# Patient Record
Sex: Female | Born: 1990
Health system: Southern US, Community
[De-identification: ages and names within clinical notes are randomized; demographics above are authoritative.]

## PROBLEM LIST (undated history)

## (undated) ENCOUNTER — Inpatient Hospital Stay (HOSPITAL_COMMUNITY): Payer: Self-pay

## (undated) DIAGNOSIS — G4730 Sleep apnea, unspecified: Secondary | ICD-10-CM

## (undated) DIAGNOSIS — M109 Gout, unspecified: Secondary | ICD-10-CM

## (undated) DIAGNOSIS — I10 Essential (primary) hypertension: Secondary | ICD-10-CM

## (undated) DIAGNOSIS — K219 Gastro-esophageal reflux disease without esophagitis: Secondary | ICD-10-CM

## (undated) DIAGNOSIS — O219 Vomiting of pregnancy, unspecified: Secondary | ICD-10-CM

## (undated) DIAGNOSIS — E119 Type 2 diabetes mellitus without complications: Secondary | ICD-10-CM

## (undated) DIAGNOSIS — J45909 Unspecified asthma, uncomplicated: Secondary | ICD-10-CM

## (undated) HISTORY — DX: Gout, unspecified: M10.9

## (undated) HISTORY — DX: Gastro-esophageal reflux disease without esophagitis: K21.9

## (undated) HISTORY — PX: NO PAST SURGERIES: SHX2092

## (undated) HISTORY — DX: Unspecified asthma, uncomplicated: J45.909

## (undated) HISTORY — DX: Sleep apnea, unspecified: G47.30

---
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
## 2010-06-09 ENCOUNTER — Inpatient Hospital Stay (HOSPITAL_COMMUNITY)
Admission: AD | Admit: 2010-06-09 | Discharge: 2010-06-09 | Payer: Self-pay | Source: Home / Self Care | Attending: Family Medicine | Admitting: Family Medicine

## 2010-06-10 LAB — URINALYSIS, ROUTINE W REFLEX MICROSCOPIC
Bilirubin Urine: NEGATIVE
Hgb urine dipstick: NEGATIVE
Ketones, ur: NEGATIVE mg/dL
Nitrite: NEGATIVE
Protein, ur: 30 mg/dL — AB
Specific Gravity, Urine: 1.01 (ref 1.005–1.030)
Urine Glucose, Fasting: NEGATIVE mg/dL
Urobilinogen, UA: 1 mg/dL (ref 0.0–1.0)
pH: 6 (ref 5.0–8.0)

## 2010-06-10 LAB — URINE MICROSCOPIC-ADD ON

## 2010-06-10 LAB — AMNISURE RUPTURE OF MEMBRANE (ROM) NOT AT ARMC: Amnisure ROM: NEGATIVE

## 2010-06-10 LAB — GLUCOSE, CAPILLARY: Glucose-Capillary: 77 mg/dL (ref 70–99)

## 2015-10-23 ENCOUNTER — Encounter (HOSPITAL_COMMUNITY): Payer: Self-pay

## 2015-10-23 ENCOUNTER — Emergency Department (HOSPITAL_COMMUNITY): Payer: Medicaid Other

## 2015-10-23 ENCOUNTER — Emergency Department (HOSPITAL_COMMUNITY)
Admission: EM | Admit: 2015-10-23 | Discharge: 2015-10-23 | Disposition: A | Discharge status: Home / Self Care | Payer: Medicaid Other | Attending: Emergency Medicine | Admitting: Emergency Medicine

## 2015-10-23 DIAGNOSIS — E860 Dehydration: Secondary | ICD-10-CM

## 2015-10-23 DIAGNOSIS — E119 Type 2 diabetes mellitus without complications: Secondary | ICD-10-CM | POA: Diagnosis not present

## 2015-10-23 DIAGNOSIS — R109 Unspecified abdominal pain: Secondary | ICD-10-CM | POA: Insufficient documentation

## 2015-10-23 DIAGNOSIS — R42 Dizziness and giddiness: Secondary | ICD-10-CM | POA: Insufficient documentation

## 2015-10-23 DIAGNOSIS — K529 Noninfective gastroenteritis and colitis, unspecified: Secondary | ICD-10-CM

## 2015-10-23 LAB — URINE MICROSCOPIC-ADD ON

## 2015-10-23 LAB — CBC
HEMATOCRIT: 39.1 % (ref 36.0–46.0)
HEMOGLOBIN: 13.1 g/dL (ref 12.0–15.0)
MCH: 27 pg (ref 26.0–34.0)
MCHC: 33.5 g/dL (ref 30.0–36.0)
MCV: 80.5 fL (ref 78.0–100.0)
Platelets: 375 10*3/uL (ref 150–400)
RBC: 4.86 MIL/uL (ref 3.87–5.11)
RDW: 13.9 % (ref 11.5–15.5)
WBC: 6.3 10*3/uL (ref 4.0–10.5)

## 2015-10-23 LAB — URINALYSIS, ROUTINE W REFLEX MICROSCOPIC
Glucose, UA: NEGATIVE mg/dL
Hgb urine dipstick: NEGATIVE
Ketones, ur: NEGATIVE mg/dL
NITRITE: NEGATIVE
PH: 6 (ref 5.0–8.0)
Protein, ur: 30 mg/dL — AB
SPECIFIC GRAVITY, URINE: 1.021 (ref 1.005–1.030)

## 2015-10-23 LAB — COMPREHENSIVE METABOLIC PANEL
ALBUMIN: 3.8 g/dL (ref 3.5–5.0)
ALT: 16 U/L (ref 14–54)
ANION GAP: 8 (ref 5–15)
AST: 25 U/L (ref 15–41)
Alkaline Phosphatase: 77 U/L (ref 38–126)
BUN: 16 mg/dL (ref 6–20)
CO2: 25 mmol/L (ref 22–32)
Calcium: 8.6 mg/dL — ABNORMAL LOW (ref 8.9–10.3)
Chloride: 103 mmol/L (ref 101–111)
Creatinine, Ser: 1.14 mg/dL — ABNORMAL HIGH (ref 0.44–1.00)
GFR calc Af Amer: >60 mL/min (ref >60)
GFR calc non Af Amer: >60 mL/min (ref >60)
GLUCOSE: 299 mg/dL — AB (ref 65–99)
POTASSIUM: 4.1 mmol/L (ref 3.5–5.1)
SODIUM: 136 mmol/L (ref 135–145)
TOTAL PROTEIN: 7.3 g/dL (ref 6.5–8.1)
Total Bilirubin: 0.6 mg/dL (ref 0.3–1.2)

## 2015-10-23 LAB — LIPASE, BLOOD: Lipase: 29 U/L (ref 11–51)

## 2015-10-23 LAB — I-STAT BETA HCG BLOOD, ED (MC, WL, AP ONLY): I-stat hCG, quantitative: <5 m[IU]/mL (ref <5)

## 2015-10-23 LAB — CBG MONITORING, ED: Glucose-Capillary: 324 mg/dL — ABNORMAL HIGH (ref 65–99)

## 2015-10-23 MED ORDER — DIATRIZOATE MEGLUMINE & SODIUM 66-10 % PO SOLN: 15.0000 mL | ORAL | Status: DC | PRN

## 2015-10-23 MED ORDER — SODIUM CHLORIDE 0.9 % IV BOLUS (SEPSIS)
2000.0000 mL | Freq: Once | INTRAVENOUS | Status: AC
  Administered 2015-10-23: 2000 mL via INTRAVENOUS

## 2015-10-23 MED ORDER — IOPAMIDOL (ISOVUE-300) INJECTION 61%: 100.0000 mL | Freq: Once | INTRAVENOUS | Status: DC | PRN

## 2015-10-23 MED ORDER — PROMETHAZINE HCL 25 MG PO TABS: 25.0000 mg | ORAL_TABLET | Freq: Three times a day (TID) | ORAL | Status: DC | PRN

## 2015-10-23 NOTE — ED Notes (Signed)
PA stated he will proceed with oral contrast CT.

## 2015-10-23 NOTE — Progress Notes (Signed)
Patient listed as having Medicaid insurance without a pcp.  Pcp listed on patient's insurance card [facility].  EDCM spoke to patient at bedside.  Patient reports [facility].  EDCm provided patient with a list of pcps who accept [place] in [place].  [facility] informed patient that to change pcp on her insurance card she will need to call the DSS to do so.  EDCm provided patient with phone number to DSS and for Medicaid transportation.  [facility] encouraged [facility] to find [facility] as soon as possible.  Patient thankful for services.  No further EDCM needs at this time.

## 2015-10-23 NOTE — ED Notes (Signed)
Pt was in [place] when she passed out at church. Ambulance came and took her to hospital.  Told pt her bp was high.  Pt did not stay b/c she did not have a ride. Pt states she has had n/v/d with abdominal pain since Sunday.  Feels light headed.  No fever.  No change in urination or vaginal discharge.

## 2015-10-23 NOTE — ED Notes (Signed)
Patient transported to CT 

## 2015-10-23 NOTE — ED Notes (Signed)
Nurse is going to start an IV and collect the blood

## 2015-10-23 NOTE — ED Notes (Signed)
RN at bedside attempting IV access for CT.

## 2015-10-23 NOTE — ED Notes (Signed)
Pt ambulatory and independent at discharge.  

## 2015-10-23 NOTE — Discharge Instructions (Signed)
Return here as needed.  Increase your fluid intake, rest as much as possible °

## 2015-10-23 NOTE — ED Notes (Signed)
Attempt US IV x 2 not successful. IV consult order.

## 2015-10-23 NOTE — ED Notes (Signed)
Pt has refused any more IV attempts.  Explained to patient that a CT scan will not be able to be performed if refusal stands.  Pt verbalized understanding and continues to refuse IV.  Will make MD aware.

## 2015-10-23 NOTE — ED Provider Notes (Signed)
CSN: [id number]     Arrival date & time 10/23/15  1513 History   First MD Initiated Contact with Patient 10/23/15 1552     Chief Complaint  Patient presents with  . Abdominal Pain  . Dizziness     (Consider location/radiation/quality/duration/timing/severity/associated sxs/prior Treatment) HPI Patient presents to the emergency department with abdominal discomfort that is intermittent over the last week along with dizziness and a syncopal episode on Sunday.  Patient states that nothing seems to make the edition better or worse.  Patient states that the pain is not constant in her abdomen, but is intermittent.  She states that she has had couple of episodes of vomiting with diarrhea.  Patient is a diabetic but does not take any medications at this time. The patient denies chest pain, shortness of breath, headache,blurred vision, neck pain, fever, cough, weakness, numbness, dizziness, anorexia, edema, rash, back pain, dysuria, hematemesis, bloody stool, near syncope, or syncope. Past Medical History  Diagnosis Date  . Diabetes mellitus without complication (HCC)    History reviewed. No pertinent past surgical history. History reviewed. No pertinent family history. Social History  Substance Use Topics  . Smoking status: Never Smoker   . Smokeless tobacco: None  . Alcohol Use: No   OB History    No data available     Review of Systems  All other systems negative except as documented in the HPI. All pertinent positives and negatives as reviewed in the HPI.  Allergies  Review of patient's allergies indicates no known allergies.  Home Medications   Prior to Admission medications   Not on File   BP 124/87 mmHg  Pulse 79  Temp(Src) 98.3 F (36.8 C) (Oral)  Resp 20  Ht 5\' 6"  (1.676 m)  SpO2 100%  LMP 10/16/2015 Physical Exam  Constitutional: She is oriented to person, place, and time. She appears well-developed and well-nourished. No distress.  HENT:  Head: Normocephalic and  atraumatic.  Mouth/Throat: Oropharynx is clear and moist.  Eyes: Pupils are equal, round, and reactive to light.  Neck: Normal range of motion. Neck supple.  Cardiovascular: Normal rate, regular rhythm and normal heart sounds.  Exam reveals no gallop and no friction rub.   No murmur heard. Pulmonary/Chest: Effort normal and breath sounds normal. No respiratory distress. She has no wheezes.  Abdominal: Soft. Bowel sounds are normal. She exhibits no distension. There is tenderness. There is no rebound and no guarding.  Neurological: She is alert and oriented to person, place, and time. She exhibits normal muscle tone. Coordination normal.  Skin: Skin is warm and dry. No rash noted. No erythema.  Psychiatric: She has a normal mood and affect. Her behavior is normal.  Nursing note and vitals reviewed.   ED Course  Procedures (including critical care time) Labs Review Labs Reviewed  COMPREHENSIVE METABOLIC PANEL - Abnormal; Notable for the following:    Glucose, Bld 299 (*)    Creatinine, Ser 1.14 (*)    Calcium 8.6 (*)    All other components within normal limits  URINALYSIS, ROUTINE W REFLEX MICROSCOPIC (NOT AT [facility]) - Abnormal; Notable for the following:    APPearance CLOUDY (*)    Bilirubin Urine SMALL (*)    Protein, ur 30 (*)    Leukocytes, UA MODERATE (*)    All other components within normal limits  URINE MICROSCOPIC-ADD ON - Abnormal; Notable for the following:    Squamous Epithelial / LPF 0-5 (*)    Bacteria, UA FEW (*)  All other components within normal limits  CBG MONITORING, ED - Abnormal; Notable for the following:    Glucose-Capillary 324 (*)    All other components within normal limits  LIPASE, BLOOD  CBC  I-STAT BETA HCG BLOOD, ED ([facility], [facility], [facility] ONLY)    Imaging Review Ct Abdomen Pelvis Wo Contrast  10/23/2015  CLINICAL DATA:  Acute onset of generalized abdominal pain, nausea, vomiting and diarrhea. Lightheadedness. High blood pressure. Initial encounter. EXAM:  CT ABDOMEN AND PELVIS WITHOUT CONTRAST TECHNIQUE: Multidetector CT imaging of the abdomen and pelvis was performed following the standard protocol without IV contrast. COMPARISON:  None. FINDINGS: The visualized lung bases are clear. The liver and spleen are unremarkable in appearance. The gallbladder is within normal limits. The pancreas and adrenal glands are unremarkable. The kidneys are unremarkable in appearance. There is no evidence of hydronephrosis. No renal or ureteral stones are seen. No perinephric stranding is appreciated. No free fluid is identified. The small bowel is unremarkable in appearance. The stomach is within normal limits. No acute vascular abnormalities are seen. The appendix is normal in caliber and contains air, without evidence of appendicitis. Contrast progresses to the level of the descending colon. The colon is grossly unremarkable in appearance. The bladder is mildly distended and grossly unremarkable. The uterus is unremarkable in appearance. The ovaries are relatively symmetric. No suspicious adnexal masses are seen. No inguinal lymphadenopathy is seen. No acute osseous abnormalities are identified. IMPRESSION: No acute abnormality seen within the abdomen or pelvis. Electronically Signed   By: Roanna Raider M.D.   On: 10/23/2015 20:14   I have personally reviewed and evaluated these images and lab results as part of my medical decision-making.   EKG Interpretation None      MDM   Final diagnoses:  Abdominal pain      The patient states that she is feeling better at this time.  She was given IV fluids.  He believes she is dehydrated.  She will be discharged home and advised she will need to follow up with a primary care doctor.  Patient agrees the plan.  All questions were answered.  The patient's abdominal pain is intermittent in nature, which does not lend itself to being an acute significant issue    Charlestine Night, PA-C 10/23/15 2038  Arby Barrette,  MD 11/01/15 [phone]

## 2016-05-03 ENCOUNTER — Encounter (HOSPITAL_COMMUNITY): Payer: Self-pay

## 2016-05-03 ENCOUNTER — Emergency Department (HOSPITAL_COMMUNITY)
Admission: EM | Admit: 2016-05-03 | Discharge: 2016-05-03 | Disposition: A | Discharge status: Home / Self Care | Payer: Medicaid Other | Attending: Emergency Medicine | Admitting: Emergency Medicine

## 2016-05-03 ENCOUNTER — Emergency Department (HOSPITAL_COMMUNITY): Payer: Medicaid Other

## 2016-05-03 DIAGNOSIS — R002 Palpitations: Secondary | ICD-10-CM | POA: Insufficient documentation

## 2016-05-03 DIAGNOSIS — I10 Essential (primary) hypertension: Secondary | ICD-10-CM | POA: Diagnosis not present

## 2016-05-03 DIAGNOSIS — E119 Type 2 diabetes mellitus without complications: Secondary | ICD-10-CM | POA: Diagnosis not present

## 2016-05-03 DIAGNOSIS — R6883 Chills (without fever): Secondary | ICD-10-CM | POA: Diagnosis not present

## 2016-05-03 DIAGNOSIS — Z7984 Long term (current) use of oral hypoglycemic drugs: Secondary | ICD-10-CM | POA: Insufficient documentation

## 2016-05-03 HISTORY — DX: Essential (primary) hypertension: I10

## 2016-05-03 LAB — CBC
HCT: 38.2 % (ref 36.0–46.0)
Hemoglobin: 12.7 g/dL (ref 12.0–15.0)
MCH: 26.8 pg (ref 26.0–34.0)
MCHC: 33.2 g/dL (ref 30.0–36.0)
MCV: 80.8 fL (ref 78.0–100.0)
Platelets: 314 10*3/uL (ref 150–400)
RBC: 4.73 MIL/uL (ref 3.87–5.11)
RDW: 13.7 % (ref 11.5–15.5)
WBC: 5.1 10*3/uL (ref 4.0–10.5)

## 2016-05-03 LAB — URINALYSIS, ROUTINE W REFLEX MICROSCOPIC
Bacteria, UA: NONE SEEN
Bilirubin Urine: NEGATIVE
GLUCOSE, UA: 150 mg/dL — AB
KETONES UR: NEGATIVE mg/dL
Leukocytes, UA: NEGATIVE
Nitrite: NEGATIVE
PROTEIN: 30 mg/dL — AB
Specific Gravity, Urine: 1.017 (ref 1.005–1.030)
pH: 5 (ref 5.0–8.0)

## 2016-05-03 LAB — BASIC METABOLIC PANEL
Anion gap: 9 (ref 5–15)
BUN: 12 mg/dL (ref 6–20)
CO2: 23 mmol/L (ref 22–32)
Calcium: 9.1 mg/dL (ref 8.9–10.3)
Chloride: 100 mmol/L — ABNORMAL LOW (ref 101–111)
Creatinine, Ser: 0.89 mg/dL (ref 0.44–1.00)
GFR calc Af Amer: >60 mL/min (ref >60)
GFR calc non Af Amer: >60 mL/min (ref >60)
Glucose, Bld: 345 mg/dL — ABNORMAL HIGH (ref 65–99)
Potassium: 4.1 mmol/L (ref 3.5–5.1)
Sodium: 132 mmol/L — ABNORMAL LOW (ref 135–145)

## 2016-05-03 LAB — I-STAT CG4 LACTIC ACID, ED: Lactic Acid, Venous: 0.9 mmol/L (ref 0.5–1.9)

## 2016-05-03 LAB — POC URINE PREG, ED: PREG TEST UR: NEGATIVE

## 2016-05-03 LAB — CBG MONITORING, ED: Glucose-Capillary: 228 mg/dL — ABNORMAL HIGH (ref 65–99)

## 2016-05-03 LAB — I-STAT TROPONIN, ED: Troponin i, poc: 0 ng/mL (ref 0.00–0.08)

## 2016-05-03 LAB — T4, FREE: Free T4: 1.22 ng/dL — ABNORMAL HIGH (ref 0.61–1.12)

## 2016-05-03 LAB — TSH: TSH: 2.572 u[IU]/mL (ref 0.350–4.500)

## 2016-05-03 MED ORDER — SODIUM CHLORIDE 0.9 % IV BOLUS (SEPSIS)
1000.0000 mL | Freq: Once | INTRAVENOUS | Status: AC
  Administered 2016-05-03: 1000 mL via INTRAVENOUS

## 2016-05-03 MED ORDER — ALBUTEROL SULFATE HFA 108 (90 BASE) MCG/ACT IN AERS
2.0000 | INHALATION_SPRAY | RESPIRATORY_TRACT | Status: DC | PRN
  Filled 2016-05-03: qty 6.7

## 2016-05-03 MED ORDER — GI COCKTAIL ~~LOC~~
30.0000 mL | Freq: Once | ORAL | Status: AC
  Administered 2016-05-03: 30 mL via ORAL
  Filled 2016-05-03 (×2): qty 30

## 2016-05-03 MED ORDER — SILVER NITRATE-POT NITRATE 75-25 % EX MISC
10.0000 | Freq: Once | CUTANEOUS | Status: DC
  Filled 2016-05-03: qty 10

## 2016-05-03 MED ORDER — LORAZEPAM 1 MG PO TABS
1.0000 mg | ORAL_TABLET | Freq: Once | ORAL | Status: DC
  Filled 2016-05-03: qty 1

## 2016-05-03 MED ORDER — ONDANSETRON HCL 4 MG/2ML IJ SOLN
4.0000 mg | Freq: Once | INTRAMUSCULAR | Status: AC
  Administered 2016-05-03: 4 mg via INTRAVENOUS
  Filled 2016-05-03: qty 2

## 2016-05-03 MED ORDER — AEROCHAMBER PLUS W/MASK MISC
1.0000 | Freq: Once | Status: DC
  Filled 2016-05-03: qty 1

## 2016-05-03 MED ORDER — LORAZEPAM 2 MG/ML IJ SOLN
0.5000 mg | Freq: Once | INTRAMUSCULAR | Status: AC
  Administered 2016-05-03: 0.5 mg via INTRAVENOUS
  Filled 2016-05-03: qty 1

## 2016-05-03 NOTE — ED Notes (Signed)
Pt ambulates to restroom with no difficulty.

## 2016-05-03 NOTE — ED Notes (Signed)
Papers reviewed with patient and silver nitrate sticks that were ordered were discontinued and not given.

## 2016-05-03 NOTE — ED Notes (Signed)
Report given.

## 2016-05-03 NOTE — Discharge Instructions (Signed)
Your chest today were overall normal. There are no signs of infection, everything with your heart seems okay. Please follow-up with your primary care doctor next week. I have attached your lab and imaging studies from today for their review. Return here for any new or worsening symptoms.

## 2016-05-03 NOTE — ED Provider Notes (Signed)
Care assumed from PA Muthersbaugh at shift change.  See her note for full H&P.  Briefly, 25 y.o. F here with palpitations and feeling cold.  Does report recent caffeine intake and decreased sleep due to stress.  No chest pain.  Work-up thus far is stable from cardiac standpoint. She is not tachycardic here, PERC negative.  EKG NSR, trop negative. Rectal temp is 96.45F.  Plan:  Lactic, watch temp.  I have added thyroid studies.  Will obs here.  Results for orders placed or performed during the hospital encounter of 05/03/16  Basic metabolic panel  Result Value Ref Range   Sodium 132 (L) 135 - 145 mmol/L   Potassium 4.1 3.5 - 5.1 mmol/L   Chloride 100 (L) 101 - 111 mmol/L   CO2 23 22 - 32 mmol/L   Glucose, Bld 345 (H) 65 - 99 mg/dL   BUN 12 6 - 20 mg/dL   Creatinine, Ser 4.090.89 0.44 - 1.00 mg/dL   Calcium 9.1 8.9 - 81.110.3 mg/dL   GFR calc non Af Amer >60 >60 mL/min   GFR calc Af Amer >60 >60 mL/min   Anion gap 9 5 - 15  CBC  Result Value Ref Range   WBC 5.1 4.0 - 10.5 K/uL   RBC 4.73 3.87 - 5.11 MIL/uL   Hemoglobin 12.7 12.0 - 15.0 g/dL   HCT 91.438.2 78.236.0 - 95.646.0 %   MCV 80.8 78.0 - 100.0 fL   MCH 26.8 26.0 - 34.0 pg   MCHC 33.2 30.0 - 36.0 g/dL   RDW 21.313.7 08.611.5 - 57.815.5 %   Platelets 314 150 - 400 K/uL  TSH  Result Value Ref Range   TSH 2.572 0.350 - 4.500 uIU/mL  T4, free  Result Value Ref Range   Free T4 1.22 (H) 0.61 - 1.12 ng/dL  Urinalysis, Routine w reflex microscopic  Result Value Ref Range   Color, Urine YELLOW YELLOW   APPearance CLEAR CLEAR   Specific Gravity, Urine 1.017 1.005 - 1.030   pH 5.0 5.0 - 8.0   Glucose, UA 150 (A) NEGATIVE mg/dL   Hgb urine dipstick SMALL (A) NEGATIVE   Bilirubin Urine NEGATIVE NEGATIVE   Ketones, ur NEGATIVE NEGATIVE mg/dL   Protein, ur 30 (A) NEGATIVE mg/dL   Nitrite NEGATIVE NEGATIVE   Leukocytes, UA NEGATIVE NEGATIVE   RBC / HPF 0-5 0 - 5 RBC/hpf   WBC, UA 0-5 0 - 5 WBC/hpf   Bacteria, UA NONE SEEN NONE SEEN   Squamous Epithelial / LPF  0-5 (A) NONE SEEN   Mucous PRESENT   I-stat troponin, ED  Result Value Ref Range   Troponin i, poc 0.00 0.00 - 0.08 ng/mL   Comment 3          POC CBG, ED  Result Value Ref Range   Glucose-Capillary 228 (H) 65 - 99 mg/dL  I-Stat CG4 Lactic Acid, ED  Result Value Ref Range   Lactic Acid, Venous 0.90 0.5 - 1.9 mmol/L  POC urine preg, ED (not at [facility])  Result Value Ref Range   Preg Test, Ur NEGATIVE NEGATIVE   Dg Chest 2 View  Result Date: 05/03/2016 CLINICAL DATA:  Tachycardia.  Hypertension. EXAM: CHEST  2 VIEW COMPARISON:  None. FINDINGS: The heart size and mediastinal contours are within normal limits. Both lungs are clear. The visualized skeletal structures are unremarkable. IMPRESSION: No active cardiopulmonary disease. Electronically Signed   By: Ellery Plunkaniel R Mitchell M.D.   On: 05/03/2016 03:04  Patients temp has remained essentiallyunchanged throughout ED visit.  FT4 slightly elevated, TSH normal.  All other labs and iamging studies normal, no signs of infection. Patient appears well on repeat exam. Her blood pressure and vitals have remained stable. She is currently asymptomatic. Will discharge home with PCP follow-up. She was given copies of her test results today to share with her primary care doctor.  Return precautions given for any new/worsening symptoms.   Garlon HatchetLisa M Elba Dendinger, PA-C 05/03/16 1256    Zadie Rhineonald Wickline, MD 05/04/16 [phone]

## 2016-05-03 NOTE — ED Provider Notes (Signed)
[facility]-EMERGENCY DEPT Provider Note   CSN: [id number] Arrival date & time: 05/03/16  0217     History   Chief Complaint Chief Complaint  Patient presents with  . Palpitations  . Chills    HPI Kathryn Shelton is a 25 y.o. female with a hx of NIDDM, HTN presents to the Emergency Department complaining of 15 min episode of palpitation onset 2am.   Associated symptoms include chills and anxiety.  Pt reports drinking water with resolution.  Pt reports several loose stools over the last few days.  Pt has had increased stress and soda (Caffeine) and decreased sleep over the last several days.    No aggravating symptoms.  Pt denies dyspnea, CP, abd pain, N/V, lightheadedness, syncope, vision changes, diaphoresis.  Pt denies URI symptoms.  Pt denies hx of same.  They have not reoccured.  Pt denies smoking, drug usage or EtOH.       The history is provided by the patient and medical records. No language interpreter was used.    Past Medical History:  Diagnosis Date  . Diabetes mellitus without complication (HCC)   . Hypertension     There are no active problems to display for this patient.   History reviewed. No pertinent surgical history.  OB History    No data available       Home Medications    Prior to Admission medications   Medication Sig Start Date End Date Taking? Authorizing Provider  metFORMIN (GLUCOPHAGE) 1000 MG tablet Take 500 mg by mouth daily.   Yes Historical Provider, MD  promethazine (PHENERGAN) 25 MG tablet Take 1 tablet (25 mg total) by mouth every 8 (eight) hours as needed for nausea or vomiting. Patient not taking: Reported on 05/03/2016 10/23/15   Charlestine Nighthristopher Lawyer, PA-C    Family History History reviewed. No pertinent family history.  Social History Social History  Substance Use Topics  . Smoking status: Never Smoker  . Smokeless tobacco: Never Used  . Alcohol use No     Allergies   Patient has no known allergies.   Review of Systems Review  of Systems  Constitutional: Positive for chills.  Cardiovascular: Positive for palpitations.  All other systems reviewed and are negative.    Physical Exam Updated Vital Signs BP (!) 137/101 (BP Location: Left Arm)   Pulse 73   Temp 97.9 F (36.6 C) (Oral)   Resp 16   Ht 5\' 6"  (1.676 m)   Wt 114.3 kg   LMP 05/02/2016 (Exact Date)   SpO2 100%   BMI 40.67 kg/m   Physical Exam  Constitutional: She appears well-developed and well-nourished. No distress.  Awake, alert, nontoxic appearance  HENT:  Head: Normocephalic and atraumatic.  Mouth/Throat: Oropharynx is clear and moist. No oropharyngeal exudate.  Eyes: Conjunctivae are normal. No scleral icterus.  Neck: Normal range of motion. Neck supple.  Cardiovascular: Normal rate, regular rhythm and intact distal pulses.   Pulmonary/Chest: Effort normal and breath sounds normal. No respiratory distress. She has no wheezes.  Equal chest expansion  Abdominal: Soft. Bowel sounds are normal. She exhibits no mass. There is no tenderness. There is no rebound and no guarding.  Musculoskeletal: Normal range of motion. She exhibits no edema.  Neurological: She is alert.  Speech is clear and goal oriented Moves extremities without ataxia  Skin: Skin is warm and dry. She is not diaphoretic.  Psychiatric: Her mood appears anxious.  Nursing note and vitals reviewed.    ED Treatments / Results  Labs (  all labs ordered are listed, but only abnormal results are displayed) Labs Reviewed  BASIC METABOLIC PANEL - Abnormal; Notable for the following:       Result Value   Sodium 132 (*)    Chloride 100 (*)    Glucose, Bld 345 (*)    All other components within normal limits  CBG MONITORING, ED - Abnormal; Notable for the following:    Glucose-Capillary 228 (*)    All other components within normal limits  CBC  TSH  T4, FREE  URINALYSIS, ROUTINE W REFLEX MICROSCOPIC  I-STAT TROPOININ, ED  I-STAT CG4 LACTIC ACID, ED  POC URINE PREG, ED     EKG  EKG Interpretation  Date/Time:  Saturday May 03 2016 02:23:11 EST Ventricular Rate:  75 PR Interval:  166 QRS Duration: 78 QT Interval:  400 QTC Calculation: 446 R Axis:   66 Text Interpretation:  Normal sinus rhythm with sinus arrhythmia Interpretation limited secondary to artifact No previous ECGs available Confirmed by Bebe Shaggy  MD, DONALD ([id number]) on 05/03/2016 3:00:18 AM       Radiology Dg Chest 2 View  Result Date: 05/03/2016 CLINICAL DATA:  Tachycardia.  Hypertension. EXAM: CHEST  2 VIEW COMPARISON:  None. FINDINGS: The heart size and mediastinal contours are within normal limits. Both lungs are clear. The visualized skeletal structures are unremarkable. IMPRESSION: No active cardiopulmonary disease. Electronically Signed   By: Ellery Plunk M.D.   On: 05/03/2016 03:04    Procedures Procedures (including critical care time)  Medications Ordered in ED Medications  gi cocktail (Maalox,Lidocaine,Donnatal) (30 mLs Oral Given 05/03/16 0516)  sodium chloride 0.9 % bolus 1,000 mL (0 mLs Intravenous Stopped 05/03/16 0628)  ondansetron (ZOFRAN) injection 4 mg (4 mg Intravenous Given 05/03/16 0513)  LORazepam (ATIVAN) injection 0.5 mg (0.5 mg Intravenous Given 05/03/16 0513)     Initial Impression / Assessment and Plan / ED Course  I have reviewed the triage vital signs and the nursing notes.  Pertinent labs & imaging results that were available during my care of the patient were reviewed by me and considered in my medical decision making (see chart for details).  Clinical Course as of May 04 707  Sat May 03, 2016  6045 Pt reports currently feeling the palpitations.  She appears anxious.  HR is 72 and regular.  She denies CP, but reports that her mouth is dry.  She has mild tachypnea and clear breath sounds.    [HM]  0430 She reports URI symptoms onset 1 week ago.    [HM]  [id number] Patient now vomiting. Emesis is nonbloody and nonbilious.  [HM]  [phone] At shift change  care was transferred to Sharilyn Sites, PA-C who will follow pending studies, re-evaulate and determine disposition.   [HM]    Clinical Course User Index [HM] Dahlia Client Aveena Bari, PA-C    Orthostatic VS for the past 24 hrs:  BP- Lying Pulse- Lying BP- Sitting Pulse- Sitting BP- Standing at 0 minutes Pulse- Standing at 0 minutes  05/03/16 0446 142/77 68 (!) 135/100 79 (!) 126/100 90    Pt here is The patient's and chills now resolved.  She reports she continues to feel "anxious."  Ativan ordered however patient vomited prior to administration. IV was established and patient was given fluids. Glucose decreased. No evidence of DKA.  She continued to complain of chills. Rectal temperature was obtained and found to be 96.6.  Lactic acid and TSH pending. Patient's been given warm blankets and fluids.  No tachycardia or  arrhythmia in the emergency department.  Final Clinical Impressions(s) / ED Diagnoses   Final diagnoses:  Chills  Palpitations    New Prescriptions New Prescriptions   No medications on file     Dierdre ForthHannah Tinia Oravec, PA-C 05/03/16 [id number]    Zadie Rhineonald Wickline, MD 05/04/16 [phone]

## 2016-05-03 NOTE — ED Triage Notes (Addendum)
Pt reports chills and "my heart is beating fast" that began tonight 30 minutes prior to arrival. Pt denies CP, sob or any other sx. Pt afebrile in triage. Pt presently on menstrual cycle.

## 2016-11-23 ENCOUNTER — Emergency Department (HOSPITAL_COMMUNITY)
Admission: EM | Admit: 2016-11-23 | Discharge: 2016-11-23 | Disposition: A | Discharge status: Home / Self Care | Payer: Medicaid Other | Attending: Emergency Medicine | Admitting: Emergency Medicine

## 2016-11-23 ENCOUNTER — Encounter (HOSPITAL_COMMUNITY): Payer: Self-pay | Admitting: Emergency Medicine

## 2016-11-23 DIAGNOSIS — E118 Type 2 diabetes mellitus with unspecified complications: Secondary | ICD-10-CM | POA: Insufficient documentation

## 2016-11-23 DIAGNOSIS — I10 Essential (primary) hypertension: Secondary | ICD-10-CM | POA: Insufficient documentation

## 2016-11-23 DIAGNOSIS — R1084 Generalized abdominal pain: Secondary | ICD-10-CM | POA: Diagnosis present

## 2016-11-23 DIAGNOSIS — E860 Dehydration: Secondary | ICD-10-CM | POA: Diagnosis not present

## 2016-11-23 DIAGNOSIS — R739 Hyperglycemia, unspecified: Secondary | ICD-10-CM

## 2016-11-23 DIAGNOSIS — R1111 Vomiting without nausea: Secondary | ICD-10-CM

## 2016-11-23 LAB — URINALYSIS, ROUTINE W REFLEX MICROSCOPIC
Bacteria, UA: NONE SEEN
Bilirubin Urine: NEGATIVE
HGB URINE DIPSTICK: NEGATIVE
Ketones, ur: NEGATIVE mg/dL
Leukocytes, UA: NEGATIVE
NITRITE: NEGATIVE
PH: 5 (ref 5.0–8.0)
Protein, ur: 30 mg/dL — AB
RBC / HPF: NONE SEEN RBC/hpf (ref 0–5)
Specific Gravity, Urine: 1.021 (ref 1.005–1.030)

## 2016-11-23 LAB — COMPREHENSIVE METABOLIC PANEL
ALK PHOS: 80 U/L (ref 38–126)
ALT: 14 U/L (ref 14–54)
ANION GAP: 9 (ref 5–15)
AST: 18 U/L (ref 15–41)
Albumin: 3.4 g/dL — ABNORMAL LOW (ref 3.5–5.0)
BILIRUBIN TOTAL: 0.7 mg/dL (ref 0.3–1.2)
BUN: 9 mg/dL (ref 6–20)
CALCIUM: 8.9 mg/dL (ref 8.9–10.3)
CO2: 24 mmol/L (ref 22–32)
CREATININE: 1.03 mg/dL — AB (ref 0.44–1.00)
Chloride: 101 mmol/L (ref 101–111)
Glucose, Bld: 501 mg/dL (ref 65–99)
Potassium: 3.6 mmol/L (ref 3.5–5.1)
SODIUM: 134 mmol/L — AB (ref 135–145)
TOTAL PROTEIN: 6.8 g/dL (ref 6.5–8.1)

## 2016-11-23 LAB — LIPASE, BLOOD: Lipase: 27 U/L (ref 11–51)

## 2016-11-23 LAB — CBG MONITORING, ED: GLUCOSE-CAPILLARY: 304 mg/dL — AB (ref 65–99)

## 2016-11-23 LAB — CBC
HCT: 39.3 % (ref 36.0–46.0)
Hemoglobin: 13 g/dL (ref 12.0–15.0)
MCH: 27.1 pg (ref 26.0–34.0)
MCHC: 33.1 g/dL (ref 30.0–36.0)
MCV: 82 fL (ref 78.0–100.0)
PLATELETS: 299 10*3/uL (ref 150–400)
RBC: 4.79 MIL/uL (ref 3.87–5.11)
RDW: 13.9 % (ref 11.5–15.5)
WBC: 5.2 10*3/uL (ref 4.0–10.5)

## 2016-11-23 LAB — I-STAT CG4 LACTIC ACID, ED
LACTIC ACID, VENOUS: 1.92 mmol/L — AB (ref 0.5–1.9)
Lactic Acid, Venous: 2.08 mmol/L (ref 0.5–1.9)

## 2016-11-23 LAB — I-STAT BETA HCG BLOOD, ED (MC, WL, AP ONLY): I-stat hCG, quantitative: <5 m[IU]/mL (ref <5)

## 2016-11-23 LAB — MAGNESIUM: Magnesium: 1.6 mg/dL — ABNORMAL LOW (ref 1.7–2.4)

## 2016-11-23 MED ORDER — SODIUM CHLORIDE 0.9 % IV BOLUS (SEPSIS): 2000.0000 mL | Freq: Once | INTRAVENOUS | Status: DC

## 2016-11-23 MED ORDER — METFORMIN HCL 500 MG PO TABS: 500.0000 mg | ORAL_TABLET | Freq: Two times a day (BID) | ORAL | 0 refills | Status: DC

## 2016-11-23 MED ORDER — MAGNESIUM OXIDE 400 (241.3 MG) MG PO TABS
200.0000 mg | ORAL_TABLET | Freq: Once | ORAL | Status: AC
  Administered 2016-11-23: 200 mg via ORAL
  Filled 2016-11-23: qty 0.5

## 2016-11-23 MED ORDER — SODIUM CHLORIDE 0.9 % IV BOLUS (SEPSIS)
1000.0000 mL | Freq: Once | INTRAVENOUS | Status: AC
  Administered 2016-11-23: 1000 mL via INTRAVENOUS

## 2016-11-23 MED ORDER — INSULIN ASPART 100 UNIT/ML ~~LOC~~ SOLN
6.0000 [IU] | Freq: Once | SUBCUTANEOUS | Status: AC
  Administered 2016-11-23: 6 [IU] via INTRAVENOUS
  Filled 2016-11-23: qty 1

## 2016-11-23 MED ORDER — ONDANSETRON HCL 4 MG/2ML IJ SOLN
4.0000 mg | Freq: Once | INTRAMUSCULAR | Status: AC
  Administered 2016-11-23: 4 mg via INTRAVENOUS
  Filled 2016-11-23: qty 2

## 2016-11-23 MED ORDER — PROMETHAZINE HCL 25 MG PO TABS: 25.0000 mg | ORAL_TABLET | Freq: Four times a day (QID) | ORAL | 0 refills | Status: DC | PRN

## 2016-11-23 NOTE — ED Notes (Signed)
Patient provided with cups of ice for her 2 liter of Sprite she brought with her. Tolerating well at this time. EDP aware.

## 2016-11-23 NOTE — Discharge Instructions (Signed)
Please follow with your primary care doctor in the next 2 days for a check-up. They must obtain records for further management.  ° °Do not hesitate to return to the Emergency Department for any new, worsening or concerning symptoms.  ° °

## 2016-11-23 NOTE — ED Triage Notes (Signed)
Pt to ER for evaluation of generalized abdominal pain with nausea and vomiting x2 weeks. Pt also states frequent urination that appears to be "foam." pt in NAD at present.

## 2016-11-23 NOTE — ED Notes (Signed)
I Stat Lactic Acid results shown to Nicole Pisciotta PA 

## 2016-11-23 NOTE — ED Notes (Signed)
Recollected dark green for istat lactic acid.

## 2016-11-23 NOTE — ED Provider Notes (Signed)
[facility]-EMERGENCY DEPT Provider Note   CSN: [id number] Arrival date & time: 11/23/16  1855     History   Chief Complaint Chief Complaint  Patient presents with  . Abdominal Pain    HPI   Blood pressure (!) 141/80, pulse (!) 105, temperature 98.1 F (36.7 C), temperature source Oral, resp. rate 18, SpO2 100 %.  Kathryn Shelton is a 26 y.o. female complaining of multiple episodes of emesis over the course of last 2 weeks she describes this as spitting. She cannot tell me how many times a day she has this emesis/spitting. She states that her abdomen is painful diffusely. She denies any change in defecation, sick contacts, fever, chills, abnormal vaginal discharge. She does state that her urine is foamy.   Past Medical History:  Diagnosis Date  . Diabetes mellitus without complication (HCC)   . Hypertension     There are no active problems to display for this patient.   History reviewed. No pertinent surgical history.  OB History    No data available       Home Medications    Prior to Admission medications   Medication Sig Start Date End Date Taking? Authorizing Provider  metFORMIN (GLUCOPHAGE) 500 MG tablet Take 1 tablet (500 mg total) by mouth 2 (two) times daily with a meal. 11/23/16   Zakhai Meisinger, Joni Reining, PA-C  promethazine (PHENERGAN) 25 MG tablet Take 1 tablet (25 mg total) by mouth every 6 (six) hours as needed for nausea or vomiting. 11/23/16   Adolfo Granieri, Mardella Layman    Family History No family history on file.  Social History Social History  Substance Use Topics  . Smoking status: Never Smoker  . Smokeless tobacco: Never Used  . Alcohol use No     Allergies   Shellfish-derived products   Review of Systems Review of Systems  A complete review of systems was obtained and all systems are negative except as noted in the HPI and PMH.    Physical Exam Updated Vital Signs BP 111/65   Pulse 79   Temp 98.1 F (36.7 C) (Oral)   Resp 18   SpO2 100%    Physical Exam  Constitutional: She is oriented to person, place, and time. She appears well-developed and well-nourished. No distress.  HENT:  Head: Normocephalic and atraumatic.  Mouth/Throat: Oropharynx is clear and moist.  Eyes: Conjunctivae and EOM are normal. Pupils are equal, round, and reactive to light.  Neck: Normal range of motion.  Cardiovascular: Normal rate, regular rhythm and intact distal pulses.   Pulmonary/Chest: Effort normal and breath sounds normal. No stridor.  Abdominal: Soft. Bowel sounds are normal. She exhibits no distension and no mass. There is no tenderness. There is no rebound and no guarding.  No tenderness to deep palpation of any quadrant.  Genitourinary:  Genitourinary Comments: No CVA tenderness to percussion bilaterally  Musculoskeletal: Normal range of motion.  Neurological: She is alert and oriented to person, place, and time.  Skin: She is not diaphoretic.  Psychiatric: She has a normal mood and affect.  Nursing note and vitals reviewed.    ED Treatments / Results  Labs (all labs ordered are listed, but only abnormal results are displayed) Labs Reviewed  COMPREHENSIVE METABOLIC PANEL - Abnormal; Notable for the following:       Result Value   Sodium 134 (*)    Glucose, Bld 501 (*)    Creatinine, Ser 1.03 (*)    Albumin 3.4 (*)    All other components within  normal limits  URINALYSIS, ROUTINE W REFLEX MICROSCOPIC - Abnormal; Notable for the following:    Glucose, UA >=500 (*)    Protein, ur 30 (*)    Squamous Epithelial / LPF 0-5 (*)    All other components within normal limits  MAGNESIUM - Abnormal; Notable for the following:    Magnesium 1.6 (*)    All other components within normal limits  I-STAT CG4 LACTIC ACID, ED - Abnormal; Notable for the following:    Lactic Acid, Venous 2.08 (*)    All other components within normal limits  CBG MONITORING, ED - Abnormal; Notable for the following:    Glucose-Capillary 304 (*)    All other  components within normal limits  I-STAT CG4 LACTIC ACID, ED - Abnormal; Notable for the following:    Lactic Acid, Venous 1.92 (*)    All other components within normal limits  LIPASE, BLOOD  CBC  I-STAT BETA HCG BLOOD, ED ([facility], [facility], [facility] ONLY)    EKG  EKG Interpretation None       Radiology No results found.  Procedures Procedures (including critical care time)  Medications Ordered in ED Medications  sodium chloride 0.9 % bolus 2,000 mL (not administered)  sodium chloride 0.9 % bolus 1,000 mL (1,000 mLs Intravenous New Bag/Given 11/23/16 2024)  ondansetron (ZOFRAN) injection 4 mg (4 mg Intravenous Given 11/23/16 2024)  magnesium oxide (MAG-OX) tablet 200 mg (200 mg Oral Given 11/23/16 2042)  insulin aspart (novoLOG) injection 6 Units (6 Units Intravenous Given 11/23/16 2049)     Initial Impression / Assessment and Plan / ED Course  I have reviewed the triage vital signs and the nursing notes.  Pertinent labs & imaging results that were available during my care of the patient were reviewed by me and considered in my medical decision making (see chart for details).     Vitals:   11/23/16 1900 11/23/16 2030  BP: (!) 141/80 111/65  Pulse: (!) 105 79  Resp: 18   Temp: 98.1 F (36.7 C)   TempSrc: Oral   SpO2: 100%     Medications  sodium chloride 0.9 % bolus 2,000 mL (not administered)  sodium chloride 0.9 % bolus 1,000 mL (1,000 mLs Intravenous New Bag/Given 11/23/16 2024)  ondansetron (ZOFRAN) injection 4 mg (4 mg Intravenous Given 11/23/16 2024)  magnesium oxide (MAG-OX) tablet 200 mg (200 mg Oral Given 11/23/16 2042)  insulin aspart (novoLOG) injection 6 Units (6 Units Intravenous Given 11/23/16 2049)    Kathryn Shelton is 26 y.o. female presenting with Intermittent emesis over the course of last several weeks and she also reports diffuse abdominal pain however abdominal exam is benign. Vital signs reassuring. Blood work with a hyperglycemia with no ketosis. Mild hypomagnesemia.  Normal potassium.  Patient states that she did not realize she was diabetic, she states that she does have a history of gestational diabetes,. Chart shows diagnosis of diabetes and metformin use. She states that she moved to [place] from [place] should have a Dr. in [place]. She would like to be referred to a local group. I will start her metformin again. Repeat abdominal exam is benign. Her lactic acid is just slightly above normal. It think it's like a secondary to dehydration from polyuria and polydipsia. I doubt that this is from an intra-abdominal infection.  Evaluation does not show pathology that would require ongoing emergent intervention or inpatient treatment. Pt is hemodynamically stable and mentating appropriately. Discussed findings and plan with patient/guardian, who agrees with care  plan. All questions answered. Return precautions discussed and outpatient follow up given.     Final Clinical Impressions(s) / ED Diagnoses   Final diagnoses:  Hyperglycemia without ketosis  Vomiting without nausea, intractability of vomiting not specified, unspecified vomiting type  Dehydration  Type 2 diabetes mellitus with complication, without long-term current use of insulin (HCC)    New Prescriptions New Prescriptions   METFORMIN (GLUCOPHAGE) 500 MG TABLET    Take 1 tablet (500 mg total) by mouth 2 (two) times daily with a meal.   PROMETHAZINE (PHENERGAN) 25 MG TABLET    Take 1 tablet (25 mg total) by mouth every 6 (six) hours as needed for nausea or vomiting.     Doyle Tegethoff, PA-Kaylyn LimC 11/23/16 2214    Lorre NickAllen, Anthony, MD 11/24/16 [phone]

## 2016-11-23 NOTE — ED Notes (Signed)
Patient provided with 2 cups of water, tolerated well. Patient verbalized understanding of discharge instructions and denies any further needs or questions at this time. VS stable. Patient ambulatory with steady gait. Escorted to ED entrance.

## 2016-12-17 NOTE — Progress Notes (Signed)
Patient ID: Kathryn Shelton, female   DOB: 08/15/1990, 26 y.o.   MRN: [id number]     Kathryn Shelton, is a 26 y.o. female  WPV:[id number]  ZSM:[id number]  DOB - Jan 06, 1991  Subjective:  Chief Complaint and HPI: Kathryn Shelton is a 26 y.o. female here today to establish care and for a follow up visit after being seen in the ED 11/23/2016 for blood sugar of 501 after presenting to the ED with vomiting. She was treated with IVF and on insulin, then discharged on Metformin.  She denies previous DM other than in pregnancy.  Her BP was also elevated. She does not have a glucometer at home.  Today, she presents for f/up.  She has yet to get her prescription for metformin filled from over 25 days ago.  She says she isn't sure where it is.  She admits to polydipsia without polyuria.  She denies other complaints today.  ED/Hospital notes reviewed.   Social History:  Boyfriend here with her who also has uncontrolled DM Family history: +DM, htn  ROS:   Constitutional:  No f/c, No night sweats, No unexplained weight loss. EENT:  No vision changes, No blurry vision, No hearing changes. No mouth, throat, or ear problems.  Respiratory: No cough, No SOB Cardiac: No CP, no palpitations GI:  No abd pain, No N/V/D. GU: No Urinary s/sx Musculoskeletal: No joint pain Neuro: No headache, no dizziness, no motor weakness.  Skin: No rash Endocrine:  + polydipsia. No polyuria.  Psych: Denies SI/HI  No problems updated.  ALLERGIES: Allergies  Allergen Reactions  . Shellfish-Derived Products Swelling    PAST MEDICAL HISTORY: Past Medical History:  Diagnosis Date  . Diabetes mellitus without complication ([place])   . Hypertension     MEDICATIONS AT HOME: Prior to Admission medications   Medication Sig Start Date End Date Taking? Authorizing Provider  metFORMIN (GLUCOPHAGE) 500 MG tablet Take 1 tablet (500 mg total) by mouth 2 (two) times daily with a meal. 12/18/16  Yes McClung, Angela M, PA-C    promethazine (PHENERGAN) 25 MG tablet Take 1 tablet (25 mg total) by mouth every 6 (six) hours as needed for nausea or vomiting. 11/23/16  Yes Pisciotta, Elmyra Ricks, PA-C  Blood Glucose Monitoring Suppl (ACCU-CHEK AVIVA PLUS) w/Device KIT Check blood sugars fasting and at bedtime 12/18/16   Freeman Caldron M, PA-C  glucose blood (ACCU-CHEK AVIVA) test strip Use as instructed 12/18/16   Argentina Donovan, PA-C  Insulin Glargine (LANTUS) 100 UNIT/ML Solostar Pen Inject 10 Units into the skin daily at 10 pm. 12/18/16   Argentina Donovan, PA-C  Lancet Devices (ACCU-CHEK [facility]) lancets Use as instructed 12/18/16   Argentina Donovan, PA-C     Objective:  EXAM:   Vitals:   12/18/16 1023  BP: 112/79  Pulse: (!) 114  Resp: 18  Temp: (!) 97.4 F (36.3 C)  TempSrc: Oral  SpO2: 97%  Weight: 232 lb 6.4 oz (105.4 kg)  Height: 5' 6"  (1.676 m)    General appearance : A&OX3. NAD. Non-toxic-appearing, obese HEENT: Atraumatic and Normocephalic.  PERRLA. EOM intact.  TM clear B. Mouth-MMM, post pharynx WNL w/o erythema, No PND. Neck: supple, no JVD. No cervical lymphadenopathy. No thyromegaly Chest/Lungs:  Breathing-non-labored, Good air entry bilaterally, breath sounds normal without rales, rhonchi, or wheezing  CVS: S1 S2 regular, no murmurs, gallops, rubs  Extremities: Bilateral Lower Ext shows no edema, both legs are warm to touch with = pulse throughout Neurology:  CN II-XII grossly intact, Non  focal.   Psych:  TP linear. J/I WNL. Normal speech. Appropriate eye contact and affect.  Skin:  No Rash  Data Review No results found for: HGBA1C=12.1 today   Assessment & Plan   1. Type 2 diabetes mellitus with complication, without long-term current use of insulin ([place]) Uncontrolled and she hasn't gotten her prescriptions filled yet. Patient Compliance is a major concern. Check blood sugar fasting in the mornings and at bedtime and record. - POCT glucose (manual entry) - HgB A1c - Comprehensive  metabolic panel - metFORMIN (GLUCOPHAGE) 500 MG tablet; Take 1 tablet (500 mg total) by mouth 2 (two) times daily with a meal.  Dispense: 60 tablet; Refill: 0 - Insulin Glargine (LANTUS) 100 UNIT/ML Solostar Pen; Inject 10 Units into the skin daily at 10 pm.  Dispense: 15 mL; Refill: 11 - Lancet Devices (ACCU-CHEK SOFTCLIX) lancets; Use as instructed  Dispense: 100 each; Refill: 1 - glucose blood (ACCU-CHEK AVIVA) test strip; Use as instructed  Dispense: 100 each; Refill: 12 - Blood Glucose Monitoring Suppl (ACCU-CHEK AVIVA PLUS) w/Device KIT; Check blood sugars fasting and at bedtime  Dispense: 1 kit; Refill: 0 - insulin aspart (novoLOG) injection 20 Units; Inject 0.2 mLs (20 Units total) into the skin once.-blood sugar went up after initial dose of novolog-not surprising given she had just finished a 20 oz mountain dew and bag of chips just prior to administration of Novolog. - - insulin aspart (novoLOG) injection 20 Units; Inject 0.2 mLs (20 Units total) into the skin once.  Also advised drinking a whole bottle of water. Blood sugar did start coming abck down after 2nd dose of Novolog.  2. Elevated BP without diagnosis of hypertension BP normal today - Comprehensive metabolic panel  3. Non-compliance Discussed importance of compliane Patient have been counseled extensively about nutrition and exercise  Return in about 2 weeks (around 01/01/2017) for appt with Theda Sers for DM med titration..  Then at next visit-assign PCP.  The patient was given clear instructions to go to ER or return to medical center if symptoms don't improve, worsen or new problems develop. The patient verbalized understanding. The patient was told to call to get lab results if they haven't heard anything in the next week.     Freeman Caldron, PA-C [facility] and [facility] [place], [place]   12/18/2016, 10:52 AM

## 2016-12-18 ENCOUNTER — Encounter: Payer: Self-pay | Admitting: Physician Assistant

## 2016-12-18 ENCOUNTER — Ambulatory Visit: Payer: Medicaid Other | Attending: Internal Medicine | Admitting: Physician Assistant

## 2016-12-18 VITALS — BP 112/79 | HR 114 | Temp 97.4°F | Resp 18 | Ht 66.0 in | Wt 232.4 lb

## 2016-12-18 DIAGNOSIS — E118 Type 2 diabetes mellitus with unspecified complications: Secondary | ICD-10-CM | POA: Diagnosis not present

## 2016-12-18 DIAGNOSIS — Z8249 Family history of ischemic heart disease and other diseases of the circulatory system: Secondary | ICD-10-CM | POA: Insufficient documentation

## 2016-12-18 DIAGNOSIS — Z91199 Patient's noncompliance with other medical treatment and regimen due to unspecified reason: Secondary | ICD-10-CM

## 2016-12-18 DIAGNOSIS — Z833 Family history of diabetes mellitus: Secondary | ICD-10-CM | POA: Insufficient documentation

## 2016-12-18 DIAGNOSIS — Z9119 Patient's noncompliance with other medical treatment and regimen: Secondary | ICD-10-CM | POA: Insufficient documentation

## 2016-12-18 DIAGNOSIS — R030 Elevated blood-pressure reading, without diagnosis of hypertension: Secondary | ICD-10-CM | POA: Diagnosis not present

## 2016-12-18 LAB — POCT GLYCOSYLATED HEMOGLOBIN (HGB A1C): HEMOGLOBIN A1C: 12.1

## 2016-12-18 LAB — GLUCOSE, POCT (MANUAL RESULT ENTRY)
POC GLUCOSE: 355 mg/dL — AB (ref 70–99)
POC Glucose: 435 mg/dl — AB (ref 70–99)

## 2016-12-18 MED ORDER — INSULIN ASPART PROT & ASPART (70-30 MIX) 100 UNIT/ML ~~LOC~~ SUSP: 20.0000 [IU] | Freq: Once | SUBCUTANEOUS | Status: DC

## 2016-12-18 MED ORDER — GLUCOSE BLOOD VI STRP: ORAL_STRIP | 12 refills | Status: DC

## 2016-12-18 MED ORDER — ACCU-CHEK AVIVA PLUS W/DEVICE KIT: PACK | 0 refills | Status: DC

## 2016-12-18 MED ORDER — ACCU-CHEK SOFTCLIX LANCET DEV MISC: 1 refills | Status: DC

## 2016-12-18 MED ORDER — METFORMIN HCL 500 MG PO TABS: 500.0000 mg | ORAL_TABLET | Freq: Two times a day (BID) | ORAL | 0 refills | Status: DC

## 2016-12-18 MED ORDER — INSULIN ASPART 100 UNIT/ML ~~LOC~~ SOLN
20.0000 [IU] | Freq: Once | SUBCUTANEOUS | Status: AC
  Administered 2016-12-18: 20 [IU] via SUBCUTANEOUS

## 2016-12-18 MED ORDER — INSULIN GLARGINE 100 UNIT/ML SOLOSTAR PEN: 10.0000 [IU] | PEN_INJECTOR | Freq: Every day | SUBCUTANEOUS | 11 refills | Status: DC

## 2016-12-18 MED FILL — ACCU-CHEK AVIVA PLUS TEST S: 25 days supply | Qty: 100 | Fill #0

## 2016-12-18 MED FILL — ACCU-CHEK AVIVA PLUS METER: W/DEVICE | 365 days supply | Qty: 1 | Fill #0

## 2016-12-18 MED FILL — ACCU-CHEK SOFTCLIX LANCETS: 25 days supply | Qty: 100 | Fill #0

## 2016-12-18 MED FILL — LANTUS SOLOSTAR 100 UNITS/M: 100 | 30 days supply | Qty: 3 | Fill #0

## 2016-12-18 MED FILL — ?METFORMIN HCL 500MG TABLET: 500 | 30 days supply | Qty: 60 | Fill #0

## 2016-12-18 NOTE — Patient Instructions (Signed)
Check blood sugar fasting in the mornings and at bedtime and record.  Diabetes Mellitus and Food It is important for you to manage your blood sugar (glucose) level. Your blood glucose level can be greatly affected by what you eat. Eating healthier foods in the appropriate amounts throughout the day at about the same time each day will help you control your blood glucose level. It can also help slow or prevent worsening of your diabetes mellitus. Healthy eating may even help you improve the level of your blood pressure and reach or maintain a healthy weight. General recommendations for healthful eating and cooking habits include:  Eating meals and snacks regularly. Avoid going long periods of time without eating to lose weight.  Eating a diet that consists mainly of plant-based foods, such as fruits, vegetables, nuts, legumes, and whole grains.  Using low-heat cooking methods, such as baking, instead of high-heat cooking methods, such as deep frying.  Work with your dietitian to make sure you understand how to use the Nutrition Facts information on food labels. How can food affect me? Carbohydrates Carbohydrates affect your blood glucose level more than any other type of food. Your dietitian will help you determine how many carbohydrates to eat at each meal and teach you how to count carbohydrates. Counting carbohydrates is important to keep your blood glucose at a healthy level, especially if you are using insulin or taking certain medicines for diabetes mellitus. Alcohol Alcohol can cause sudden decreases in blood glucose (hypoglycemia), especially if you use insulin or take certain medicines for diabetes mellitus. Hypoglycemia can be a life-threatening condition. Symptoms of hypoglycemia (sleepiness, dizziness, and disorientation) are similar to symptoms of having too much alcohol. If your health care provider has given you approval to drink alcohol, do so in moderation and use the following  guidelines:  Women should not have more than one drink per day, and men should not have more than two drinks per day. One drink is equal to: ? 12 oz of beer. ? 5 oz of wine. ? 1 oz of hard liquor.  Do not drink on an empty stomach.  Keep yourself hydrated. Have water, diet soda, or unsweetened iced tea.  Regular soda, juice, and other mixers might contain a lot of carbohydrates and should be counted.  What foods are not recommended? As you make food choices, it is important to remember that all foods are not the same. Some foods have fewer nutrients per serving than other foods, even though they might have the same number of calories or carbohydrates. It is difficult to get your body what it needs when you eat foods with fewer nutrients. Examples of foods that you should avoid that are high in calories and carbohydrates but low in nutrients include:  Trans fats (most processed foods list trans fats on the Nutrition Facts label).  Regular soda.  Juice.  Candy.  Sweets, such as cake, pie, doughnuts, and cookies.  Fried foods.  What foods can I eat? Eat nutrient-rich foods, which will nourish your body and keep you healthy. The food you should eat also will depend on several factors, including:  The calories you need.  The medicines you take.  Your weight.  Your blood glucose level.  Your blood pressure level.  Your cholesterol level.  You should eat a variety of foods, including:  Protein. ? Lean cuts of meat. ? Proteins low in saturated fats, such as fish, egg whites, and beans. Avoid processed meats.  Fruits and vegetables. ?  Fruits and vegetables that may help control blood glucose levels, such as apples, mangoes, and yams.  Dairy products. ? Choose fat-free or low-fat dairy products, such as milk, yogurt, and cheese.  Grains, bread, pasta, and rice. ? Choose whole grain products, such as multigrain bread, whole oats, and brown rice. These foods may help  control blood pressure.  Fats. ? Foods containing healthful fats, such as nuts, avocado, olive oil, canola oil, and fish.  Does everyone with diabetes mellitus have the same meal plan? Because every person with diabetes mellitus is different, there is not one meal plan that works for everyone. It is very important that you meet with a dietitian who will help you create a meal plan that is just right for you. This information is not intended to replace advice given to you by your health care provider. Make sure you discuss any questions you have with your health care provider. Document Released: 02/06/2005 Document Revised: 10/18/2015 Document Reviewed: 04/08/2013 Elsevier Interactive Patient Education  2017 [employer].

## 2016-12-18 NOTE — Progress Notes (Signed)
Patient has eaten Patient has not had any medication

## 2016-12-19 ENCOUNTER — Telehealth: Payer: Self-pay | Admitting: *Deleted

## 2016-12-19 LAB — COMPREHENSIVE METABOLIC PANEL
ALT: 13 IU/L (ref 0–32)
AST: 14 IU/L (ref 0–40)
Albumin/Globulin Ratio: 1.4 (ref 1.2–2.2)
Albumin: 3.9 g/dL (ref 3.5–5.5)
Alkaline Phosphatase: 81 IU/L (ref 39–117)
BUN/Creatinine Ratio: 14 (ref 9–23)
BUN: 13 mg/dL (ref 6–20)
Bilirubin Total: 0.4 mg/dL (ref 0.0–1.2)
CALCIUM: 8.8 mg/dL (ref 8.7–10.2)
CO2: 19 mmol/L — AB (ref 20–29)
CREATININE: 0.96 mg/dL (ref 0.57–1.00)
Chloride: 97 mmol/L (ref 96–106)
GFR, EST AFRICAN AMERICAN: 93 mL/min/{1.73_m2} (ref >59)
GFR, EST NON AFRICAN AMERICAN: 81 mL/min/{1.73_m2} (ref >59)
Globulin, Total: 2.7 g/dL (ref 1.5–4.5)
Glucose: 399 mg/dL — ABNORMAL HIGH (ref 65–99)
POTASSIUM: 4.4 mmol/L (ref 3.5–5.2)
Sodium: 134 mmol/L (ref 134–144)
TOTAL PROTEIN: 6.6 g/dL (ref 6.0–8.5)

## 2016-12-19 NOTE — Telephone Encounter (Signed)
Medical Assistant left message on patient's home and cell voicemail. Voicemail states to give a call back to Antonette Hendricks with [facility] at [phone].  

## 2016-12-19 NOTE — Telephone Encounter (Signed)
-----   Message from Anders SimmondsAngela M McClung, [place] JerseyPA-C sent at 12/19/2016 11:31 AM EDT ----- Please call patient and tell her that her kidney and liver function is normal.  Blood sugar high as expected since she hasn't been on any medications.  Tell her to make sure she is taking her medications as we discussed and to check her blood sugars as we discussed and follow up as planned.  Thanks, Georgian CoAngela McClung, PA-C

## 2017-01-01 ENCOUNTER — Ambulatory Visit: Payer: Self-pay | Admitting: Pharmacist

## 2017-01-06 ENCOUNTER — Ambulatory Visit: Payer: Self-pay | Admitting: Pharmacist

## 2017-01-06 NOTE — Progress Notes (Deleted)
    S:     No chief complaint on file.   Patient arrives ***.  Presents for diabetes evaluation, education, and management at the request of ***. Patient was referred on 12/18/16.  Patient was last seen by Primary Care Provider on 12/18/16.   Patient reports Diabetes was diagnosed in ***.   Family/Social History:   Patient {Actions; denies-reports:120008} adherence with medications.  Current diabetes medications include: Current hypertension medications include:   Patient {Actions; denies-reports:120008} hypoglycemic events.  Patient reported dietary habits: Eats *** meals/day Breakfast:*** Lunch:*** Dinner:*** Snacks:*** Drinks:***  Patient reported exercise habits:    Patient {Actions; denies-reports:120008} nocturia.  Patient {Actions; denies-reports:120008} neuropathy. Patient {Actions; denies-reports:120008} visual changes. Patient {Actions; denies-reports:120008} self foot exams.   [email or web address]   O:  Physical Exam   ROS   Lab Results  Component Value Date   HGBA1C 12.1 12/18/2016   There were no vitals filed for this visit.  Home fasting CBG: ***  2 hour post-prandial/random CBG: ***.  10 year ASCVD risk: ***.  A/P: Diabetes longstanding/newly diagnosed currently ***. Patient {Actions; denies-reports:120008} hypoglycemic events and is able to verbalize appropriate hypoglycemia management plan. Patient {Actions; denies-reports:120008} adherence with medication. Control is suboptimal due to ***.  PICK 1 OF THE FOLLOWING 4 (DELETE the others AND THEN DELETE THIS LINE OF TEXT)  {Meds adjust:18428} basal insulin Lantus (insulin glargine). Patient will continue to titrate 1 unit/day if fasting CBGs > 100mg /dl until fasting CBGs reach goal or next visit.    {Meds adjust:18428} basal insulin Lantus (insulin glargine) to ***. {Meds adjust:18428} rapid insulin Novolog (insulin aspart) to ***.   {Meds adjust:18428} Victoza (liraglutide) to ***.   {Meds  adjust:18428} Trulicity (dulaglutide) to ***.   {Meds adjust:18428} Jardiance (empagliflozin) to ***.  Next A1C anticipated ***.    ASCVD risk greater than 7.5%. {Meds adjust:18428} Aspirin *** mg and {Meds adjust:18428} ***statin *** mg.   Hypertension longstanding/newly diagnosed currently ***.  Patient {Actions; denies-reports:120008} adherence with medication. Control is suboptimal due to ***.  Written patient instructions provided.  Total time in face to face counseling *** minutes.   Follow up in Pharmacist Clinic Visit ***.

## 2017-02-12 ENCOUNTER — Ambulatory Visit: Payer: Self-pay | Admitting: Family Medicine

## 2017-03-01 DIAGNOSIS — F410 Panic disorder [episodic paroxysmal anxiety] without agoraphobia: Secondary | ICD-10-CM | POA: Insufficient documentation

## 2017-03-01 DIAGNOSIS — I10 Essential (primary) hypertension: Secondary | ICD-10-CM

## 2017-03-01 DIAGNOSIS — E1165 Type 2 diabetes mellitus with hyperglycemia: Secondary | ICD-10-CM | POA: Insufficient documentation

## 2017-03-01 DIAGNOSIS — IMO0002 Reserved for concepts with insufficient information to code with codable children: Secondary | ICD-10-CM | POA: Insufficient documentation

## 2017-03-01 DIAGNOSIS — R002 Palpitations: Secondary | ICD-10-CM | POA: Insufficient documentation

## 2017-03-01 DIAGNOSIS — G4733 Obstructive sleep apnea (adult) (pediatric): Secondary | ICD-10-CM | POA: Insufficient documentation

## 2017-03-01 HISTORY — DX: Essential (primary) hypertension: I10

## 2017-03-02 ENCOUNTER — Ambulatory Visit: Payer: Self-pay | Admitting: Family Medicine

## 2017-03-11 ENCOUNTER — Emergency Department (HOSPITAL_COMMUNITY): Payer: Medicaid Other

## 2017-03-11 ENCOUNTER — Encounter (HOSPITAL_COMMUNITY): Payer: Self-pay

## 2017-03-11 ENCOUNTER — Emergency Department (HOSPITAL_COMMUNITY)
Admission: EM | Admit: 2017-03-11 | Discharge: 2017-03-11 | Disposition: A | Discharge status: Home / Self Care | Payer: Medicaid Other | Attending: Emergency Medicine | Admitting: Emergency Medicine

## 2017-03-11 DIAGNOSIS — Z79899 Other long term (current) drug therapy: Secondary | ICD-10-CM | POA: Diagnosis not present

## 2017-03-11 DIAGNOSIS — Z794 Long term (current) use of insulin: Secondary | ICD-10-CM | POA: Diagnosis not present

## 2017-03-11 DIAGNOSIS — E119 Type 2 diabetes mellitus without complications: Secondary | ICD-10-CM | POA: Diagnosis not present

## 2017-03-11 DIAGNOSIS — I10 Essential (primary) hypertension: Secondary | ICD-10-CM | POA: Insufficient documentation

## 2017-03-11 DIAGNOSIS — M79672 Pain in left foot: Secondary | ICD-10-CM | POA: Diagnosis present

## 2017-03-11 MED ORDER — KETOROLAC TROMETHAMINE 60 MG/2ML IM SOLN
30.0000 mg | Freq: Once | INTRAMUSCULAR | Status: AC
  Administered 2017-03-11: 30 mg via INTRAMUSCULAR
  Filled 2017-03-11: qty 2

## 2017-03-11 NOTE — ED Notes (Signed)
Bed: WTR7 Expected date:  Expected time:  Means of arrival:  Comments: 

## 2017-03-11 NOTE — Discharge Instructions (Signed)
Rest, Ice intermittently (in the first 24-48 hours), Gentle compression with an Ace wrap, and elevate (Limb above the level of the heart)   Starting tomorrow: Take up to 800mg  of ibuprofen (that is usually 4 over the counter pills)  3 times a day for 5 days. Take with food.  Please follow with your primary care doctor in the next 2 days for a check-up. They must obtain records for further management.   Do not hesitate to return to the Emergency Department for any new, worsening or concerning symptoms.

## 2017-03-11 NOTE — ED Provider Notes (Signed)
[facility] DEPT Provider Note   CSN: [id number] Arrival date & time: 03/11/17  8546     History   Chief Complaint Chief Complaint  Patient presents with  . Foot Pain    HPI   Blood pressure (!) 137/108, pulse 88, temperature 98.9 F (37.2 C), temperature source Oral, resp. rate 14, height 5' 6"  (1.676 m), weight 104.3 kg (230 lb), last menstrual period 02/09/2017, SpO2 98 %.  Kathryn Shelton is a 26 y.o. female complaining of atraumatic left lateral foot pain onset over the course of a week. She states that it is sharp, out of 10 and exacerbated by movement, palpation weightbearing. No pain medication taken prior to arrival.  Past Medical History:  Diagnosis Date  . Diabetes mellitus without complication ([place])   . Hypertension     There are no active problems to display for this patient.   History reviewed. No pertinent surgical history.  OB History    No data available       Home Medications    Prior to Admission medications   Medication Sig Start Date End Date Taking? Authorizing Provider  Blood Glucose Monitoring Suppl (ACCU-CHEK AVIVA PLUS) w/Device KIT Check blood sugars fasting and at bedtime 12/18/16   Freeman Caldron M, PA-C  glucose blood (ACCU-CHEK AVIVA) test strip Use as instructed 12/18/16   Argentina Donovan, PA-C  Insulin Glargine (LANTUS) 100 UNIT/ML Solostar Pen Inject 10 Units into the skin daily at 10 pm. 12/18/16   Argentina Donovan, PA-C  Lancet Devices (ACCU-CHEK [facility]) lancets Use as instructed 12/18/16   Argentina Donovan, PA-C  metFORMIN (GLUCOPHAGE) 500 MG tablet Take 1 tablet (500 mg total) by mouth 2 (two) times daily with a meal. 12/18/16   McClung, Dionne Bucy, PA-C  promethazine (PHENERGAN) 25 MG tablet Take 1 tablet (25 mg total) by mouth every 6 (six) hours as needed for nausea or vomiting. 11/23/16   Cathie Bonnell, Elmyra Ricks, PA-C    Family History Family History  Problem Relation Age of Onset  . Family history  unknown: Yes    Social History Social History  Substance Use Topics  . Smoking status: Never Smoker  . Smokeless tobacco: Never Used  . Alcohol use No     Allergies   Shellfish-derived products   Review of Systems Review of Systems  A complete review of systems was obtained and all systems are negative except as noted in the HPI and PMH.    Physical Exam Updated Vital Signs BP (!) 137/108 (BP Location: Left Arm)   Pulse 88   Temp 98.9 F (37.2 C) (Oral)   Resp 14   Ht 5' 6"  (1.676 m)   Wt 104.3 kg (230 lb)   LMP 02/09/2017 (Approximate)   SpO2 98%   BMI 37.12 kg/m   Physical Exam  Constitutional: She is oriented to person, place, and time. She appears well-developed and well-nourished. No distress.  HENT:  Head: Normocephalic and atraumatic.  Mouth/Throat: Oropharynx is clear and moist.  Eyes: Pupils are equal, round, and reactive to light. Conjunctivae and EOM are normal.  Neck: Normal range of motion.  Cardiovascular: Normal rate, regular rhythm and intact distal pulses.   Pulmonary/Chest: Effort normal and breath sounds normal.  Abdominal: Soft. There is no tenderness.  Musculoskeletal: Normal range of motion. She exhibits tenderness. She exhibits no edema or deformity.  Left ankle/foot:  No deformity, no overlying skin changes, No swelling, No tenderness to palpation to the bilateral malleolus. No bony  tenderness palpation, distally neurovascularly intact.   Neurological: She is alert and oriented to person, place, and time.  Skin: She is not diaphoretic.  Psychiatric: She has a normal mood and affect.  Nursing note and vitals reviewed.    ED Treatments / Results  Labs (all labs ordered are listed, but only abnormal results are displayed) Labs Reviewed - No data to display  EKG  EKG Interpretation None       Radiology Dg Ankle Complete Left  Result Date: 03/11/2017 CLINICAL DATA:  Left lateral ankle pain.  Pain for few days. EXAM: LEFT  ANKLE COMPLETE - 3+ VIEW COMPARISON:  None. FINDINGS: There is no evidence of fracture, dislocation, or joint effusion. There is no evidence of arthropathy or other focal bone abnormality. Dystrophic calcification in the distal anterior tibial soft tissues. No other focal soft tissue abnormality. IMPRESSION: No acute osseous injury of the left ankle. Electronically Signed   By: Kathreen Devoid   On: 03/11/2017 11:31   Dg Foot Complete Left  Result Date: 03/11/2017 CLINICAL DATA:  One week of lateral foot pain made worse with weight-bearing. No known trauma. EXAM: LEFT FOOT - COMPLETE 3+ VIEW COMPARISON:  None in PACs FINDINGS: The bones of the left foot are subjectively adequately mineralized. There is no acute or healing fracture. There is no lytic or blastic lesion. Specific attention to the fifth ray reveals no bony abnormality. The joint spaces are well maintained. There may be mild soft tissue swelling dorsally over the forefoot. IMPRESSION: No acute or significant chronic bony abnormality is observed. Electronically Signed   By: David  Martinique M.D.   On: 03/11/2017 10:13    Procedures Procedures (including critical care time)  Medications Ordered in ED Medications  ketorolac (TORADOL) injection 30 mg (not administered)     Initial Impression / Assessment and Plan / ED Course  I have reviewed the triage vital signs and the nursing notes.  Pertinent labs & imaging results that were available during my care of the patient were reviewed by me and considered in my medical decision making (see chart for details).     Vitals:   03/11/17 0920 03/11/17 0927 03/11/17 0931  BP:   (!) 137/108  Pulse:  85 88  Resp:  14 14  Temp:  98.1 F (36.7 C) 98.9 F (37.2 C)  TempSrc:  Oral Oral  SpO2: 98%  98%  Weight:   104.3 kg (230 lb)  Height:   5' 6"  (1.676 m)    Medications  ketorolac (TORADOL) injection 30 mg (not administered)    Kathryn Shelton is 26 y.o. female presenting with atraumatic  left foot and ankle pain going on for 1 week. Patient neurovascularly intact. No overlying skin changes. She hasn't tried any pain medication at home. Recommend RICE in close follow-up with primary care.  Evaluation does not show pathology that would require ongoing emergent intervention or inpatient treatment. Pt is hemodynamically stable and mentating appropriately. Discussed findings and plan with patient/guardian, who agrees with care plan. All questions answered. Return precautions discussed and outpatient follow up given.      Final Clinical Impressions(s) / ED Diagnoses   Final diagnoses:  Foot pain, left    New Prescriptions New Prescriptions   No medications on file     Waynetta Pean 03/11/17 [place], Ankit, MD 03/12/17 2237

## 2017-03-11 NOTE — ED Triage Notes (Signed)
Per EMS- Patient c/o left foot pain and states she is unable to bear weight on her left foot.

## 2017-03-11 NOTE — ED Notes (Signed)
ANKLE NEGATIVE FOR SWELLING OR DEFORMITIES, PATIENT HAS GOOD PMS

## 2017-03-11 NOTE — ED Triage Notes (Signed)
Patient c/o left foot and left ankle pain x 1 week. Pain is worse with weight bearing. Patient denies any injury.

## 2017-03-19 ENCOUNTER — Emergency Department (HOSPITAL_COMMUNITY): Payer: Medicaid Other

## 2017-03-19 ENCOUNTER — Emergency Department (HOSPITAL_COMMUNITY)
Admission: EM | Admit: 2017-03-19 | Discharge: 2017-03-19 | Disposition: A | Discharge status: Home / Self Care | Payer: Medicaid Other | Attending: Emergency Medicine | Admitting: Emergency Medicine

## 2017-03-19 ENCOUNTER — Other Ambulatory Visit: Payer: Self-pay

## 2017-03-19 DIAGNOSIS — R002 Palpitations: Secondary | ICD-10-CM | POA: Insufficient documentation

## 2017-03-19 DIAGNOSIS — Z91013 Allergy to seafood: Secondary | ICD-10-CM | POA: Diagnosis not present

## 2017-03-19 DIAGNOSIS — I10 Essential (primary) hypertension: Secondary | ICD-10-CM | POA: Insufficient documentation

## 2017-03-19 DIAGNOSIS — E119 Type 2 diabetes mellitus without complications: Secondary | ICD-10-CM | POA: Diagnosis not present

## 2017-03-19 DIAGNOSIS — R739 Hyperglycemia, unspecified: Secondary | ICD-10-CM | POA: Diagnosis not present

## 2017-03-19 DIAGNOSIS — Z794 Long term (current) use of insulin: Secondary | ICD-10-CM | POA: Insufficient documentation

## 2017-03-19 DIAGNOSIS — E118 Type 2 diabetes mellitus with unspecified complications: Secondary | ICD-10-CM

## 2017-03-19 LAB — BASIC METABOLIC PANEL
Anion gap: 9 (ref 5–15)
BUN: 12 mg/dL (ref 6–20)
CALCIUM: 8 mg/dL — AB (ref 8.9–10.3)
CO2: 21 mmol/L — ABNORMAL LOW (ref 22–32)
Chloride: 101 mmol/L (ref 101–111)
Creatinine, Ser: 0.96 mg/dL (ref 0.44–1.00)
GFR calc Af Amer: >60 mL/min (ref >60)
GLUCOSE: 416 mg/dL — AB (ref 65–99)
Potassium: 5.2 mmol/L — ABNORMAL HIGH (ref 3.5–5.1)
Sodium: 131 mmol/L — ABNORMAL LOW (ref 135–145)

## 2017-03-19 LAB — URINALYSIS, ROUTINE W REFLEX MICROSCOPIC
Bacteria, UA: NONE SEEN
Bilirubin Urine: NEGATIVE
Ketones, ur: NEGATIVE mg/dL
Leukocytes, UA: NEGATIVE
Nitrite: NEGATIVE
PH: 6 (ref 5.0–8.0)
PROTEIN: NEGATIVE mg/dL
Specific Gravity, Urine: 1.02 (ref 1.005–1.030)
Squamous Epithelial / LPF: NONE SEEN

## 2017-03-19 LAB — I-STAT TROPONIN, ED: TROPONIN I, POC: 0 ng/mL (ref 0.00–0.08)

## 2017-03-19 LAB — CBC
HEMATOCRIT: 38.9 % (ref 36.0–46.0)
Hemoglobin: 13 g/dL (ref 12.0–15.0)
MCH: 27.1 pg (ref 26.0–34.0)
MCHC: 33.4 g/dL (ref 30.0–36.0)
MCV: 81.2 fL (ref 78.0–100.0)
Platelets: 358 10*3/uL (ref 150–400)
RBC: 4.79 MIL/uL (ref 3.87–5.11)
RDW: 13.4 % (ref 11.5–15.5)
WBC: 5.3 10*3/uL (ref 4.0–10.5)

## 2017-03-19 LAB — CBG MONITORING, ED
Glucose-Capillary: 413 mg/dL — ABNORMAL HIGH (ref 65–99)
Glucose-Capillary: 167 mg/dL — ABNORMAL HIGH (ref 65–99)

## 2017-03-19 LAB — MAGNESIUM: Magnesium: 1.8 mg/dL (ref 1.7–2.4)

## 2017-03-19 LAB — I-STAT BETA HCG BLOOD, ED (MC, WL, AP ONLY): I-stat hCG, quantitative: <5 m[IU]/mL (ref <5)

## 2017-03-19 LAB — TSH: TSH: 3.91 u[IU]/mL (ref 0.350–4.500)

## 2017-03-19 MED ORDER — SODIUM CHLORIDE 0.9 % IV BOLUS (SEPSIS)
1000.0000 mL | Freq: Once | INTRAVENOUS | Status: AC
  Administered 2017-03-19: 1000 mL via INTRAVENOUS

## 2017-03-19 MED ORDER — METFORMIN HCL 500 MG PO TABS: 500.0000 mg | ORAL_TABLET | Freq: Two times a day (BID) | ORAL | 0 refills | Status: DC

## 2017-03-19 MED ORDER — INSULIN ASPART 100 UNIT/ML ~~LOC~~ SOLN
8.0000 [IU] | Freq: Once | SUBCUTANEOUS | Status: AC
  Administered 2017-03-19: 8 [IU] via INTRAVENOUS
  Filled 2017-03-19: qty 1

## 2017-03-19 NOTE — ED Provider Notes (Signed)
TIME SEEN: 5:37 AM  CHIEF COMPLAINT: Palpitations  HPI: Patient is a 26 year old female with history of diabetes who presents to the emergency department with palpitations that woke her from sleep.  States she felt like "my heart was beating fast".  No chest pain or shortness of breath.  The palpitations have improved.  States that EMS found that her blood glucose was 523.  Reports she has been taking her Lantus 15 units at night but has been out of her metformin 500 mg twice a day for the past 3 weeks.  Also states her blood pressure was high with EMS but this has improved here.  No vomiting or diarrhea.  No history of DKA.  No history of PE or DVT.  No lower extremity swelling or pain.  ROS: See HPI Constitutional: no fever  Eyes: no drainage  ENT: no runny nose   Cardiovascular:  no chest pain  Resp: no SOB  GI: no vomiting GU: no dysuria Integumentary: no rash  Allergy: no hives  Musculoskeletal: no leg swelling  Neurological: no slurred speech ROS otherwise negative  PAST MEDICAL HISTORY/PAST SURGICAL HISTORY:  Past Medical History:  Diagnosis Date  . Diabetes mellitus without complication ([place])   . Hypertension     MEDICATIONS:  Prior to Admission medications   Medication Sig Start Date End Date Taking? Authorizing Provider  Blood Glucose Monitoring Suppl (ACCU-CHEK AVIVA PLUS) w/Device KIT Check blood sugars fasting and at bedtime 12/18/16   Freeman Caldron M, PA-C  glucose blood (ACCU-CHEK AVIVA) test strip Use as instructed 12/18/16   Argentina Donovan, PA-C  Insulin Glargine (LANTUS) 100 UNIT/ML Solostar Pen Inject 10 Units into the skin daily at 10 pm. 12/18/16   Argentina Donovan, PA-C  Lancet Devices (ACCU-CHEK [facility]) lancets Use as instructed 12/18/16   Argentina Donovan, PA-C  metFORMIN (GLUCOPHAGE) 500 MG tablet Take 1 tablet (500 mg total) by mouth 2 (two) times daily with a meal. 12/18/16   McClung, Dionne Bucy, PA-C  promethazine (PHENERGAN) 25 MG tablet Take 1 tablet  (25 mg total) by mouth every 6 (six) hours as needed for nausea or vomiting. 11/23/16   Pisciotta, Elmyra Ricks, PA-C    ALLERGIES:  Allergies  Allergen Reactions  . Shellfish-Derived Products Swelling    SOCIAL HISTORY:  Social History  Substance Use Topics  . Smoking status: Never Smoker  . Smokeless tobacco: Never Used  . Alcohol use No    FAMILY HISTORY: Family History  Problem Relation Age of Onset  . Family history unknown: Yes    EXAM: BP (!) 138/103 (BP Location: Left Arm)   Pulse 88   Temp 97.9 F (36.6 C) (Oral)   Resp (!) 21   Ht _0  (1.651 m)   LMP 03/19/2017   SpO2 98%  CONSTITUTIONAL: Alert and oriented and responds appropriately to questions. Well-appearing; well-nourished HEAD: Normocephalic EYES: Conjunctivae clear, pupils appear equal, EOMI ENT: normal nose; moist mucous membranes NECK: Supple, no meningismus, no nuchal rigidity, no LAD  CARD: RRR; S1 and S2 appreciated; no murmurs, no clicks, no rubs, no gallops RESP: Normal chest excursion without splinting or tachypnea; breath sounds clear and equal bilaterally; no wheezes, no rhonchi, no rales, no hypoxia or respiratory distress, speaking full sentences ABD/GI: Normal bowel sounds; non-distended; soft, non-tender, no rebound, no guarding, no peritoneal signs, no hepatosplenomegaly BACK:  The back appears normal and is non-tender to palpation, there is no CVA tenderness EXT: Normal ROM in all joints; non-tender to palpation; no  edema; normal capillary refill; no cyanosis, no calf tenderness or swelling    SKIN: Normal color for age and race; warm; no rash NEURO: Moves all extremities equally PSYCH: The patient's mood and manner are appropriate. Grooming and personal hygiene are appropriate.  MEDICAL DECISION MAKING: Patient here with palpitations that have resolved.  EKG shows no ischemic abnormality.  No change compared to previous.  She is hyperglycemic.  Will obtain labs, urine.  Will give IV fluids, IV  insulin.  No chest pain or shortness of breath.  Doubt ACS, PE or dissection.  No recent infectious symptoms.  Palpitations may be cause for mild dehydration from hyperglycemia.  Will check electrolytes.  Will check thyroid function test.  ED PROGRESS: Patient's electrolytes are normal.  Potassium 5.2 but it has hemolyzed.  I do not feel this needs to be repeated.  She is not in DKA.  Hemoglobin normal.  TSH normal.  Pregnancy test negative.  Troponin negative.  Chest x-ray clear.  Plan is to recheck blood glucose after IV fluids and IV insulin.  Urine shows no sign of infection.  No ketones.  Blood sugar is now 167.  She is no longer having palpitations.  I feel she is safe to be discharged home with outpatient follow-up with her primary care provider.  We will give her a refill of her metformin.  Patient comfortable with this plan.   At this time, I do not feel there is any life-threatening condition present. I have reviewed and discussed all results (EKG, imaging, lab, urine as appropriate) and exam findings with patient/family. I have reviewed nursing notes and appropriate previous records.  I feel the patient is safe to be discharged home without further emergent workup and can continue workup as an outpatient as needed. Discussed usual and customary return precautions. Patient/family verbalize understanding and are comfortable with this plan.  Outpatient follow-up has been provided if needed. All questions have been answered.    EKG Interpretation  Date/Time:  Thursday March 19 2017 05:15:15 EDT Ventricular Rate:  94 PR Interval:    QRS Duration: 86 QT Interval:  380 QTC Calculation: 476 R Axis:   97 Text Interpretation:  Sinus rhythm Borderline right axis deviation Borderline T abnormalities, anterior leads Borderline prolonged QT interval No significant change since last tracing Confirmed by Romyn Boswell, Cyril Mourning [phone]) on 03/19/2017 5:37:38 AM         Jimma Ortman, Delice Bison, DO 03/19/17  4584

## 2017-03-19 NOTE — ED Triage Notes (Signed)
Pt comes via [facility] from home, woke up feeling like her heart was racing. Hx of diabetes and hypertension, has not taken BP meds in a while, reports normal CBG of 400.

## 2017-03-19 NOTE — ED Notes (Signed)
Patient transported to X-ray 

## 2017-04-27 ENCOUNTER — Ambulatory Visit: Payer: Self-pay | Admitting: Registered"

## 2017-05-26 NOTE — L&D Delivery Note (Signed)
Patient: Kathryn Shelton MRN: [id number]  GBS status: negative (03/09/18), IAP given: yes for chorio   Patient is a 27 y.o. now G3P3 s/p NSVD at 287w3d, who was admitted for PPROM. PPROM occurred ~11 weeks prior to delivery with yellow fluid.    Delivery Note At 9:39 PM a viable female was delivered via Vaginal, Spontaneous (Presentation: cephalic; ROA).  APGAR: 8, 8; weight: 1440g.   Placenta status: intact, 3-vessel cord.  Cord:  with the following complications: loose nuchal x 1, delivered through.  Shoulder and body delivered in usual fashion. Infant with spontaneous cry, placed on mother's abdomen, dried and bulb suctioned. Cord clamped x 2 after 1-minute delay, and cut by family member. Cord blood drawn. Placenta delivered spontaneously with gentle cord traction. Fundus firm with massage and Pitocin. Perineum inspected and found to have no lacerations, with good hemostasis noted.  Anesthesia: epidural  Episiotomy: None Lacerations: None Suture Repair: none Est. Blood Loss (mL): 108  Mom to postpartum.  Baby to NICU.  Gwenevere AbbotNimeka Burgess Sheriff Ob Fellow 05/19/2018, 11:01 PM

## 2017-05-29 ENCOUNTER — Other Ambulatory Visit: Payer: Self-pay

## 2017-05-29 ENCOUNTER — Emergency Department (HOSPITAL_COMMUNITY)
Admission: EM | Admit: 2017-05-29 | Discharge: 2017-05-29 | Disposition: A | Discharge status: Home / Self Care | Payer: Medicaid Other | Attending: Emergency Medicine | Admitting: Emergency Medicine

## 2017-05-29 ENCOUNTER — Encounter (HOSPITAL_COMMUNITY): Payer: Self-pay

## 2017-05-29 DIAGNOSIS — Z794 Long term (current) use of insulin: Secondary | ICD-10-CM | POA: Insufficient documentation

## 2017-05-29 DIAGNOSIS — E1165 Type 2 diabetes mellitus with hyperglycemia: Secondary | ICD-10-CM | POA: Diagnosis present

## 2017-05-29 DIAGNOSIS — R739 Hyperglycemia, unspecified: Secondary | ICD-10-CM

## 2017-05-29 DIAGNOSIS — I10 Essential (primary) hypertension: Secondary | ICD-10-CM | POA: Diagnosis not present

## 2017-05-29 DIAGNOSIS — R002 Palpitations: Secondary | ICD-10-CM

## 2017-05-29 DIAGNOSIS — Z9114 Patient's other noncompliance with medication regimen: Secondary | ICD-10-CM

## 2017-05-29 DIAGNOSIS — E118 Type 2 diabetes mellitus with unspecified complications: Secondary | ICD-10-CM

## 2017-05-29 LAB — COMPREHENSIVE METABOLIC PANEL
ALK PHOS: 86 U/L (ref 38–126)
ALT: 21 U/L (ref 14–54)
AST: 18 U/L (ref 15–41)
Albumin: 3.5 g/dL (ref 3.5–5.0)
Anion gap: 7 (ref 5–15)
BILIRUBIN TOTAL: 0.4 mg/dL (ref 0.3–1.2)
BUN: 13 mg/dL (ref 6–20)
CALCIUM: 8.9 mg/dL (ref 8.9–10.3)
CO2: 23 mmol/L (ref 22–32)
CREATININE: 0.77 mg/dL (ref 0.44–1.00)
Chloride: 105 mmol/L (ref 101–111)
GFR calc Af Amer: >60 mL/min (ref >60)
Glucose, Bld: 384 mg/dL — ABNORMAL HIGH (ref 65–99)
POTASSIUM: 4.1 mmol/L (ref 3.5–5.1)
Sodium: 135 mmol/L (ref 135–145)
TOTAL PROTEIN: 6.9 g/dL (ref 6.5–8.1)

## 2017-05-29 LAB — CBC WITH DIFFERENTIAL/PLATELET
BASOS ABS: 0 10*3/uL (ref 0.0–0.1)
BASOS PCT: 0 %
EOS ABS: 0 10*3/uL (ref 0.0–0.7)
EOS PCT: 1 %
HCT: 42.2 % (ref 36.0–46.0)
Hemoglobin: 14.3 g/dL (ref 12.0–15.0)
LYMPHS PCT: 34 %
Lymphs Abs: 1.6 10*3/uL (ref 0.7–4.0)
MCH: 27.3 pg (ref 26.0–34.0)
MCHC: 33.9 g/dL (ref 30.0–36.0)
MCV: 80.7 fL (ref 78.0–100.0)
MONO ABS: 0.3 10*3/uL (ref 0.1–1.0)
Monocytes Relative: 6 %
Neutro Abs: 2.8 10*3/uL (ref 1.7–7.7)
Neutrophils Relative %: 59 %
PLATELETS: 281 10*3/uL (ref 150–400)
RBC: 5.23 MIL/uL — AB (ref 3.87–5.11)
RDW: 13.1 % (ref 11.5–15.5)
WBC: 4.7 10*3/uL (ref 4.0–10.5)

## 2017-05-29 LAB — I-STAT TROPONIN, ED: TROPONIN I, POC: 0 ng/mL (ref 0.00–0.08)

## 2017-05-29 LAB — CBG MONITORING, ED: GLUCOSE-CAPILLARY: 404 mg/dL — AB (ref 65–99)

## 2017-05-29 LAB — I-STAT BETA HCG BLOOD, ED (MC, WL, AP ONLY): I-stat hCG, quantitative: <5 m[IU]/mL (ref <5)

## 2017-05-29 MED ORDER — ACCU-CHEK AVIVA PLUS W/DEVICE KIT: PACK | 0 refills | Status: DC

## 2017-05-29 MED ORDER — METFORMIN HCL 500 MG PO TABS: 500.0000 mg | ORAL_TABLET | Freq: Two times a day (BID) | ORAL | 0 refills | Status: DC

## 2017-05-29 MED ORDER — METFORMIN HCL 500 MG PO TABS
500.0000 mg | ORAL_TABLET | Freq: Once | ORAL | Status: AC
  Administered 2017-05-29: 500 mg via ORAL
  Filled 2017-05-29: qty 1

## 2017-05-29 MED ORDER — INSULIN ASPART 100 UNIT/ML IV SOLN
10.0000 [IU] | Freq: Once | INTRAVENOUS | Status: DC
  Filled 2017-05-29: qty 0.1

## 2017-05-29 MED ORDER — ACCU-CHEK SOFTCLIX LANCET DEV MISC: 1 refills | Status: DC

## 2017-05-29 MED ORDER — SODIUM CHLORIDE 0.9 % IV BOLUS (SEPSIS)
1000.0000 mL | Freq: Once | INTRAVENOUS | Status: AC
  Administered 2017-05-29: 1000 mL via INTRAVENOUS

## 2017-05-29 MED ORDER — INSULIN ASPART 100 UNIT/ML ~~LOC~~ SOLN
10.0000 [IU] | Freq: Once | SUBCUTANEOUS | Status: AC
  Administered 2017-05-29: 10 [IU] via SUBCUTANEOUS
  Filled 2017-05-29: qty 1

## 2017-05-29 MED ORDER — INSULIN GLARGINE 100 UNIT/ML SOLOSTAR PEN: 10.0000 [IU] | PEN_INJECTOR | Freq: Every day | SUBCUTANEOUS | 11 refills | Status: DC

## 2017-05-29 MED ORDER — GLUCOSE BLOOD VI STRP: ORAL_STRIP | 12 refills | Status: DC

## 2017-05-29 NOTE — ED Provider Notes (Signed)
[facility] DEPT Provider Note   CSN: [id number] Arrival date & time: 05/29/17  0345     History   Chief Complaint Chief Complaint  Patient presents with  . Hyperglycemia    HPI Kathryn Shelton is a 27 y.o. female.  The history is provided by the patient.  Hyperglycemia  She has a history of hypertension and diabetes and states that she ran out of medications one month ago.  She has not seen her physician because she is new to [facility] and does not know how to get to the office.  She complains that she has been urinating foamy urine for the last 3weeks.  During that time, she has been urinating frequently.  She called for an ambulance tonight because she felt that her heart was racing.  She denies chest pain, heaviness, tightness, pressure.  She denies nausea or vomiting.  EMS reportedly found her blood sugar was over 300 and that her heart rate was elevated and blood pressure was elevated.  She no longer feels like her heart is racing.  Her home medications are Lantus insulin and metformin.  She does not check her blood sugar or her blood pressure at home.  Past Medical History:  Diagnosis Date  . Diabetes mellitus without complication ([place])   . Hypertension     There are no active problems to display for this patient.   History reviewed. No pertinent surgical history.  OB History    No data available       Home Medications    Prior to Admission medications   Medication Sig Start Date End Date Taking? Authorizing Provider  Insulin Glargine (LANTUS) 100 UNIT/ML Solostar Pen Inject 10 Units into the skin daily at 10 pm. 12/18/16  Yes Thereasa Solo, Dionne Bucy, PA-C  Blood Glucose Monitoring Suppl (ACCU-CHEK AVIVA PLUS) w/Device KIT Check blood sugars fasting and at bedtime Patient not taking: Reported on 05/29/2017 12/18/16   Argentina Donovan, PA-C  glucose blood (ACCU-CHEK AVIVA) test strip Use as instructed Patient not taking: Reported on 05/29/2017  12/18/16   Argentina Donovan, PA-C  Lancet Devices [facility]) lancets Use as instructed Patient not taking: Reported on 05/29/2017 12/18/16   Argentina Donovan, PA-C  metFORMIN (GLUCOPHAGE) 500 MG tablet Take 1 tablet (500 mg total) by mouth 2 (two) times daily with a meal. Patient not taking: Reported on 05/29/2017 03/19/17   Ward, Delice Bison, DO  promethazine (PHENERGAN) 25 MG tablet Take 1 tablet (25 mg total) by mouth every 6 (six) hours as needed for nausea or vomiting. Patient not taking: Reported on 05/29/2017 11/23/16   Pisciotta, Elmyra Ricks, PA-C    Family History Family History  Family history unknown: Yes    Social History Social History   Tobacco Use  . Smoking status: Never Smoker  . Smokeless tobacco: Never Used  Substance Use Topics  . Alcohol use: No  . Drug use: No     Allergies   Shellfish-derived products   Review of Systems Review of Systems  All other systems reviewed and are negative.    Physical Exam Updated Vital Signs BP (!) 143/104 (BP Location: Right Arm)   Pulse 79   Temp 98 F (36.7 C) (Oral)   Resp 16   SpO2 97%   Physical Exam  Nursing note and vitals reviewed.  Morbidly obese 27 year old female, resting comfortably and in no acute distress. Vital signs are significant for hypertension. Oxygen saturation is 97%, which is normal. Head  is normocephalic and atraumatic. PERRLA, EOMI. Oropharynx is clear. Neck is nontender and supple without adenopathy or JVD. Back is nontender and there is no CVA tenderness. Lungs are clear without rales, wheezes, or rhonchi. Chest is nontender. Heart has regular rate and rhythm without murmur. Abdomen is soft, flat, nontender without masses or hepatosplenomegaly and peristalsis is normoactive. Extremities have no cyanosis or edema, full range of motion is present. Skin is warm and dry without rash. Neurologic: Mental status is normal, cranial nerves are intact, there are no motor or sensory  deficits.  ED Treatments / Results  Labs (all labs ordered are listed, but only abnormal results are displayed) Labs Reviewed  COMPREHENSIVE METABOLIC PANEL - Abnormal; Notable for the following components:      Result Value   Glucose, Bld 384 (*)    All other components within normal limits  CBC WITH DIFFERENTIAL/PLATELET - Abnormal; Notable for the following components:   RBC 5.23 (*)    All other components within normal limits  CBG MONITORING, ED - Abnormal; Notable for the following components:   Glucose-Capillary 404 (*)    All other components within normal limits  URINALYSIS, ROUTINE W REFLEX MICROSCOPIC  CBG MONITORING, ED  I-STAT TROPONIN, ED  I-STAT BETA HCG BLOOD, ED ([facility], [facility], [facility] ONLY)    EKG Normal sinus rhythm 78 bpm.  Normal axis.  Normal intervals.  Nonspecific T wave flattening in the anterior leads.  When compared with ECG of March 19, 2017, QT interval has shortened.  Procedures Procedures (including critical care time)  Medications Ordered in ED Medications  insulin aspart (novoLOG) injection 10 Units (not administered)  sodium chloride 0.9 % bolus 1,000 mL (1,000 mLs Intravenous New Bag/Given 05/29/17 0424)  metFORMIN (GLUCOPHAGE) tablet 500 mg (500 mg Oral Given 05/29/17 0640)     Initial Impression / Assessment and Plan / ED Course  I have reviewed the triage vital signs and the nursing notes.  Pertinent labs & imaging results that were available during my care of the patient were reviewed by me and considered in my medical decision making (see chart for details).  Subjective palpitations.  Medication noncompliance.  Old records are reviewed, and she does have another ED visit last October with complaints of subjective palpitations.  She had run out of her metformin at that time also, and glucose was 523.  Blood pressure is mildly elevated today.  Will check metabolic panel and urinalysis.  Anticipate discharging her home with prescriptions for her home  medications.  She also needs to start monitoring glucose and blood pressure at home.  Laboratory workup is significant only for hyperglycemia.  She is given IV fluid and insulin and also given her morning dose of metformin.  Patient is advised of this finding.  No evidence of tachycardia on my exam or on monitoring in the ED.  She is given new prescriptions for her insulin and insulin supplies as well as metformin.  Importance of medication compliance and careful monitoring of glucose levels was stressed to the patient with emphasis on risk of complications including heart attack, stroke, blindness, kidney failure, amputations.  Final Clinical Impressions(s) / ED Diagnoses   Final diagnoses:  Hyperglycemia  Noncompliance with medication regimen  Palpitations    ED Discharge Orders        Ordered    glucose blood (ACCU-CHEK AVIVA) test strip     05/29/17 0754    Blood Glucose Monitoring Suppl (ACCU-CHEK AVIVA PLUS) w/Device KIT     05/29/17  0754    Insulin Glargine (LANTUS) 100 UNIT/ML Solostar Pen  Daily at 10 pm     05/29/17 0754    metFORMIN (GLUCOPHAGE) 500 MG tablet  2 times daily with meals     05/29/17 0754    Lancet Devices (ACCU-CHEK SOFTCLIX) lancets     36/46/80 3212       Delora Fuel, MD 24/82/50 [phone]

## 2017-05-29 NOTE — ED Notes (Signed)
Bed: [facility] Expected date:  Expected time:  Means of arrival:  Comments: 

## 2017-05-29 NOTE — Discharge Instructions (Signed)
It is very important that you take your medication for diabetes every day, and do not allow yourself to run out of medication.  Inadequately treated diabetes leads to heart attacks, strokes, blindness, kidney failure, and amputations.  If you wish to avoid these serious problems, you need to make sure that your blood sugar is kept in an appropriate range.  This means checking your blood sugars at home in addition to taking your medications.

## 2017-05-29 NOTE — ED Notes (Signed)
EMS was called for anxiety and they found her to have high blood pressure and her blood sugar is elvated

## 2017-06-10 ENCOUNTER — Other Ambulatory Visit: Payer: Self-pay

## 2017-06-10 ENCOUNTER — Encounter (HOSPITAL_COMMUNITY): Payer: Self-pay | Admitting: Emergency Medicine

## 2017-06-10 ENCOUNTER — Emergency Department (HOSPITAL_COMMUNITY)
Admission: EM | Admit: 2017-06-10 | Discharge: 2017-06-10 | Disposition: A | Discharge status: Home / Self Care | Payer: Medicaid Other | Attending: Emergency Medicine | Admitting: Emergency Medicine

## 2017-06-10 DIAGNOSIS — E1165 Type 2 diabetes mellitus with hyperglycemia: Secondary | ICD-10-CM | POA: Diagnosis not present

## 2017-06-10 DIAGNOSIS — R002 Palpitations: Secondary | ICD-10-CM

## 2017-06-10 DIAGNOSIS — Z9114 Patient's other noncompliance with medication regimen: Secondary | ICD-10-CM

## 2017-06-10 DIAGNOSIS — R739 Hyperglycemia, unspecified: Secondary | ICD-10-CM

## 2017-06-10 DIAGNOSIS — I10 Essential (primary) hypertension: Secondary | ICD-10-CM | POA: Insufficient documentation

## 2017-06-10 DIAGNOSIS — Z794 Long term (current) use of insulin: Secondary | ICD-10-CM | POA: Diagnosis not present

## 2017-06-10 DIAGNOSIS — Z91148 Patient's other noncompliance with medication regimen for other reason: Secondary | ICD-10-CM

## 2017-06-10 LAB — I-STAT BETA HCG BLOOD, ED (MC, WL, AP ONLY): I-stat hCG, quantitative: <5 m[IU]/mL (ref <5)

## 2017-06-10 LAB — BASIC METABOLIC PANEL
Anion gap: 12 (ref 5–15)
BUN: 10 mg/dL (ref 6–20)
CHLORIDE: 99 mmol/L — AB (ref 101–111)
CO2: 23 mmol/L (ref 22–32)
CREATININE: 1.03 mg/dL — AB (ref 0.44–1.00)
Calcium: 9.1 mg/dL (ref 8.9–10.3)
GFR calc Af Amer: >60 mL/min (ref >60)
Glucose, Bld: 430 mg/dL — ABNORMAL HIGH (ref 65–99)
Potassium: 3.8 mmol/L (ref 3.5–5.1)
Sodium: 134 mmol/L — ABNORMAL LOW (ref 135–145)

## 2017-06-10 LAB — CBC
HCT: 40.3 % (ref 36.0–46.0)
Hemoglobin: 13.6 g/dL (ref 12.0–15.0)
MCH: 27 pg (ref 26.0–34.0)
MCHC: 33.7 g/dL (ref 30.0–36.0)
MCV: 80 fL (ref 78.0–100.0)
PLATELETS: 322 10*3/uL (ref 150–400)
RBC: 5.04 MIL/uL (ref 3.87–5.11)
RDW: 13.3 % (ref 11.5–15.5)
WBC: 4.2 10*3/uL (ref 4.0–10.5)

## 2017-06-10 LAB — URINALYSIS, ROUTINE W REFLEX MICROSCOPIC
Bilirubin Urine: NEGATIVE
KETONES UR: NEGATIVE mg/dL
LEUKOCYTES UA: NEGATIVE
Nitrite: NEGATIVE
PH: 6 (ref 5.0–8.0)
Protein, ur: 30 mg/dL — AB
SPECIFIC GRAVITY, URINE: 1.023 (ref 1.005–1.030)

## 2017-06-10 LAB — CBG MONITORING, ED
GLUCOSE-CAPILLARY: 426 mg/dL — AB (ref 65–99)
Glucose-Capillary: 369 mg/dL — ABNORMAL HIGH (ref 65–99)

## 2017-06-10 MED ORDER — INSULIN ASPART 100 UNIT/ML IV SOLN: 10.0000 [IU] | Freq: Once | INTRAVENOUS | Status: DC

## 2017-06-10 MED ORDER — SODIUM CHLORIDE 0.9 % IV BOLUS (SEPSIS)
1000.0000 mL | Freq: Once | INTRAVENOUS | Status: AC
Start: 2017-06-10 — End: 2017-06-10
  Administered 2017-06-10: 1000 mL via INTRAVENOUS

## 2017-06-10 MED ORDER — INSULIN ASPART 100 UNIT/ML ~~LOC~~ SOLN
10.0000 [IU] | Freq: Once | SUBCUTANEOUS | Status: AC
  Administered 2017-06-10: 10 [IU] via SUBCUTANEOUS
  Filled 2017-06-10: qty 1

## 2017-06-10 NOTE — ED Provider Notes (Signed)
[facility] EMERGENCY DEPARTMENT Provider Note   CSN: [id number] Arrival date & time: 06/10/17  0441     History   Chief Complaint Chief Complaint  Patient presents with  . Hyperglycemia    HPI Kathryn Shelton is a 27 y.o. female.  The history is provided by the patient.  She has a history of hypertension and diabetes and states that she was awakened with a sense that her heart was racing and she was sweaty.  She denies chest pain, heaviness, tightness, pressure.  She denies nausea or vomiting.  She denies diaphoresis.  Symptoms have resolved.  She did call EMS who noted that her blood pressure and blood sugars were both high.  She states that she does not have any of her medication because she has recently moved to [facility] and has not seen a physician.  Past Medical History:  Diagnosis Date  . Diabetes mellitus without complication ([place])   . Hypertension     There are no active problems to display for this patient.   History reviewed. No pertinent surgical history.  OB History    No data available       Home Medications    Prior to Admission medications   Medication Sig Start Date End Date Taking? Authorizing Provider  Blood Glucose Monitoring Suppl (ACCU-CHEK AVIVA PLUS) w/Device KIT Check blood sugars fasting and at bedtime 01/26/00   Delora Fuel, MD  glucose blood (ACCU-CHEK AVIVA) test strip Use as instructed 0/0/71   Delora Fuel, MD  Insulin Glargine (LANTUS) 100 UNIT/ML Solostar Pen Inject 10 Units into the skin daily at 10 pm. 06/27/95   Delora Fuel, MD  Lancet Devices [facility]) lancets Use as instructed 09/30/81   Delora Fuel, MD  metFORMIN (GLUCOPHAGE) 500 MG tablet Take 1 tablet (500 mg total) by mouth 2 (two) times daily with a meal. 07/01/47   Delora Fuel, MD    Family History Family History  Family history unknown: Yes    Social History Social History   Tobacco Use  . Smoking status: Never Smoker  . Smokeless  tobacco: Never Used  Substance Use Topics  . Alcohol use: No  . Drug use: No     Allergies   Shellfish-derived products   Review of Systems Review of Systems  All other systems reviewed and are negative.    Physical Exam Updated Vital Signs BP 127/84   Pulse 80   Resp 10   SpO2 99%   Physical Exam  Nursing note and vitals reviewed.  Obese 27 year old female, resting comfortably and in no acute distress. Vital signs are normal. Oxygen saturation is 99%, which is normal. Head is normocephalic and atraumatic. PERRLA, EOMI. Oropharynx is clear. Neck is nontender and supple without adenopathy or JVD. Back is nontender and there is no CVA tenderness. Lungs are clear without rales, wheezes, or rhonchi. Chest is nontender. Heart has regular rate and rhythm without murmur. Abdomen is soft, flat, nontender without masses or hepatosplenomegaly and peristalsis is normoactive. Extremities have no cyanosis or edema, full range of motion is present. Skin is warm and dry without rash. Neurologic: Mental status is normal, cranial nerves are intact, there are no motor or sensory deficits.  ED Treatments / Results  Labs (all labs ordered are listed, but only abnormal results are displayed) Labs Reviewed  BASIC METABOLIC PANEL - Abnormal; Notable for the following components:      Result Value   Sodium 134 (*)    Chloride  99 (*)    Glucose, Bld 430 (*)    Creatinine, Ser 1.03 (*)    All other components within normal limits  URINALYSIS, ROUTINE W REFLEX MICROSCOPIC - Abnormal; Notable for the following components:   Color, Urine STRAW (*)    Glucose, UA >=500 (*)    Hgb urine dipstick SMALL (*)    Protein, ur 30 (*)    Bacteria, UA RARE (*)    Squamous Epithelial / LPF 0-5 (*)    All other components within normal limits  CBG MONITORING, ED - Abnormal; Notable for the following components:   Glucose-Capillary 426 (*)    All other components within normal limits  CBG  MONITORING, ED - Abnormal; Notable for the following components:   Glucose-Capillary 369 (*)    All other components within normal limits  CBC  I-STAT BETA HCG BLOOD, ED ([facility], [facility], [facility] ONLY)    EKG  EKG Interpretation  Date/Time:  Wednesday June 10 2017 04:52:13 EST Ventricular Rate:  96 PR Interval:    QRS Duration: 78 QT Interval:  359 QTC Calculation: 454 R Axis:   51 Text Interpretation:  Sinus rhythm Borderline T abnormalities, anterior leads When compared with ECG of 05/29/2017, No significant change was found Confirmed by Delora Fuel ([id number]) on 06/10/2017 5:04:24 AM      Procedures Procedures (including critical care time)  Medications Ordered in ED Medications  sodium chloride 0.9 % bolus 1,000 mL (not administered)  insulin aspart (novoLOG) injection 10 Units (not administered)     Initial Impression / Assessment and Plan / ED Course  I have reviewed the triage vital signs and the nursing notes.  Pertinent lab results that were available during my care of the patient were reviewed by me and considered in my medical decision making (see chart for details).  Subjective palpitations.  Hyperglycemia.  Old records are reviewed, and she has 2 prior ED visits with virtually identical complaints.  Most recent visit was on January 4 at which time patient was given prescriptions for all of her medications as well as for a glucose monitor.  When informed of this, patient stated that she is due to [facility] and does not know where the pharmacy is, but that she uses [facility].  She apparently has not attempted to find where the nearest pharmacy was and then states that she cannot get there because she does not have a car does not know anybody.  She will be given IV fluids and insulin to correct her hyperglycemia.  I have identified where the nearest [facility] is for where she is living and I have given her instructions how to get there by public transportation and also by walking.  She  also claims that she does not know how to get to her primary care physician's office.  I have given her directions and how to get there by public transportation.  Once again, I have stressed the importance of proper control of blood pressure and blood glucose to prevent complications of heart attack, stroke, kidney failure, blindness, amputations.  Final Clinical Impressions(s) / ED Diagnoses   Final diagnoses:  Palpitations  Hyperglycemia  Non compliance w medication regimen    ED Discharge Orders    None       Delora Fuel, MD 56/97/94 [phone]

## 2017-06-10 NOTE — Discharge Instructions (Signed)
Get your prescriptions filled. I am enclosing directions to the [facility] to where you live. You can walk there, or take a bus. It is important that you take all of your medicine as prescribed. I am also enclosing directions to your primary care provider. If you don't control your diabetes and blood pressure, you will have stroke, heart attacks, kidney failure, have a limb amputation, and/or go blind.

## 2017-06-10 NOTE — ED Triage Notes (Signed)
Per EMS pt was at home and woke up feeling as if her heart "was racing" and was "wet with sweat".  Upon EMS arrival pt was warm and dry. No nausea or vomiting. Pt has been out of her hypertension meds and insulin for several months. CBG on arrival 426.  Bp 128/105, HR97, 97% on RA, RR20

## 2017-06-14 ENCOUNTER — Encounter (HOSPITAL_COMMUNITY): Payer: Self-pay | Admitting: *Deleted

## 2017-06-14 ENCOUNTER — Other Ambulatory Visit: Payer: Self-pay

## 2017-06-14 ENCOUNTER — Emergency Department (HOSPITAL_COMMUNITY): Payer: Medicaid Other

## 2017-06-14 ENCOUNTER — Emergency Department (HOSPITAL_COMMUNITY)
Admission: EM | Admit: 2017-06-14 | Discharge: 2017-06-14 | Disposition: A | Discharge status: Home / Self Care | Payer: Medicaid Other | Attending: Emergency Medicine | Admitting: Emergency Medicine

## 2017-06-14 DIAGNOSIS — R739 Hyperglycemia, unspecified: Secondary | ICD-10-CM | POA: Insufficient documentation

## 2017-06-14 DIAGNOSIS — Z5321 Procedure and treatment not carried out due to patient leaving prior to being seen by health care provider: Secondary | ICD-10-CM | POA: Diagnosis not present

## 2017-06-14 DIAGNOSIS — R079 Chest pain, unspecified: Secondary | ICD-10-CM | POA: Insufficient documentation

## 2017-06-14 LAB — BASIC METABOLIC PANEL
Anion gap: 10 (ref 5–15)
BUN: 10 mg/dL (ref 6–20)
CHLORIDE: 101 mmol/L (ref 101–111)
CO2: 22 mmol/L (ref 22–32)
CREATININE: 0.9 mg/dL (ref 0.44–1.00)
Calcium: 8.9 mg/dL (ref 8.9–10.3)
Glucose, Bld: 424 mg/dL — ABNORMAL HIGH (ref 65–99)
Potassium: 3.8 mmol/L (ref 3.5–5.1)
SODIUM: 133 mmol/L — AB (ref 135–145)

## 2017-06-14 LAB — CBC
HCT: 41.8 % (ref 36.0–46.0)
Hemoglobin: 14.2 g/dL (ref 12.0–15.0)
MCH: 27.4 pg (ref 26.0–34.0)
MCHC: 34 g/dL (ref 30.0–36.0)
MCV: 80.7 fL (ref 78.0–100.0)
PLATELETS: 312 10*3/uL (ref 150–400)
RBC: 5.18 MIL/uL — ABNORMAL HIGH (ref 3.87–5.11)
RDW: 12.8 % (ref 11.5–15.5)
WBC: 3.9 10*3/uL — AB (ref 4.0–10.5)

## 2017-06-14 LAB — CBG MONITORING, ED: GLUCOSE-CAPILLARY: 426 mg/dL — AB (ref 65–99)

## 2017-06-14 LAB — HCG, QUANTITATIVE, PREGNANCY

## 2017-06-14 LAB — I-STAT TROPONIN, ED: TROPONIN I, POC: 0 ng/mL (ref 0.00–0.08)

## 2017-06-14 NOTE — ED Notes (Signed)
Pt called to reassess. No answer X3

## 2017-06-14 NOTE — ED Triage Notes (Signed)
Pt c/o chest pain x 2 weeks with hyperglycemia. Hx of gestational diabetes and was on insulin at that time. Has not seen a primary care provider in over a year but has an appt on 1/23.

## 2017-10-19 ENCOUNTER — Emergency Department (HOSPITAL_COMMUNITY)
Admission: EM | Admit: 2017-10-19 | Discharge: 2017-10-20 | Disposition: A | Discharge status: Home / Self Care | Payer: Medicaid Other | Attending: Emergency Medicine | Admitting: Emergency Medicine

## 2017-10-19 DIAGNOSIS — K529 Noninfective gastroenteritis and colitis, unspecified: Secondary | ICD-10-CM | POA: Insufficient documentation

## 2017-10-19 DIAGNOSIS — R1084 Generalized abdominal pain: Secondary | ICD-10-CM | POA: Diagnosis present

## 2017-10-19 DIAGNOSIS — E119 Type 2 diabetes mellitus without complications: Secondary | ICD-10-CM | POA: Diagnosis not present

## 2017-10-19 DIAGNOSIS — R1110 Vomiting, unspecified: Secondary | ICD-10-CM | POA: Insufficient documentation

## 2017-10-19 DIAGNOSIS — I10 Essential (primary) hypertension: Secondary | ICD-10-CM | POA: Diagnosis not present

## 2017-10-19 DIAGNOSIS — Z794 Long term (current) use of insulin: Secondary | ICD-10-CM | POA: Diagnosis not present

## 2017-10-19 NOTE — ED Triage Notes (Addendum)
Pt reports abd pain, nausea, vomiting x1, diarrhea since yesterday. Reports generalized adb pain and weakness. Reports she hasn't had anything to eat today because she's scared of vomiting again. No PTA meds. LMP unknown. Reports noncompliance with DM regimen.

## 2017-10-20 LAB — COMPREHENSIVE METABOLIC PANEL
ALT: 20 U/L (ref 14–54)
AST: 18 U/L (ref 15–41)
Albumin: 3.5 g/dL (ref 3.5–5.0)
Alkaline Phosphatase: 77 U/L (ref 38–126)
Anion gap: 9 (ref 5–15)
BILIRUBIN TOTAL: 0.8 mg/dL (ref 0.3–1.2)
BUN: 8 mg/dL (ref 6–20)
CHLORIDE: 106 mmol/L (ref 101–111)
CO2: 22 mmol/L (ref 22–32)
Calcium: 8.5 mg/dL — ABNORMAL LOW (ref 8.9–10.3)
Creatinine, Ser: 0.97 mg/dL (ref 0.44–1.00)
GFR calc Af Amer: >60 mL/min (ref >60)
GFR calc non Af Amer: >60 mL/min (ref >60)
GLUCOSE: 260 mg/dL — AB (ref 65–99)
POTASSIUM: 3.5 mmol/L (ref 3.5–5.1)
Sodium: 137 mmol/L (ref 135–145)
TOTAL PROTEIN: 7.2 g/dL (ref 6.5–8.1)

## 2017-10-20 LAB — URINALYSIS, ROUTINE W REFLEX MICROSCOPIC
BACTERIA UA: NONE SEEN
Bilirubin Urine: NEGATIVE
Glucose, UA: NEGATIVE mg/dL
Ketones, ur: NEGATIVE mg/dL
Leukocytes, UA: NEGATIVE
Nitrite: NEGATIVE
Protein, ur: 100 mg/dL — AB
SPECIFIC GRAVITY, URINE: 1.018 (ref 1.005–1.030)
pH: 5 (ref 5.0–8.0)

## 2017-10-20 LAB — CBC
HEMATOCRIT: 42.6 % (ref 36.0–46.0)
Hemoglobin: 13.9 g/dL (ref 12.0–15.0)
MCH: 26.4 pg (ref 26.0–34.0)
MCHC: 32.6 g/dL (ref 30.0–36.0)
MCV: 81 fL (ref 78.0–100.0)
Platelets: 315 10*3/uL (ref 150–400)
RBC: 5.26 MIL/uL — ABNORMAL HIGH (ref 3.87–5.11)
RDW: 13.4 % (ref 11.5–15.5)
WBC: 3 10*3/uL — ABNORMAL LOW (ref 4.0–10.5)

## 2017-10-20 LAB — I-STAT BETA HCG BLOOD, ED (MC, WL, AP ONLY)

## 2017-10-20 LAB — LIPASE, BLOOD: Lipase: 27 U/L (ref 11–51)

## 2017-10-20 MED ORDER — PROMETHAZINE HCL 25 MG PO TABS: 25.0000 mg | ORAL_TABLET | Freq: Four times a day (QID) | ORAL | 0 refills | Status: DC | PRN

## 2017-10-20 MED ORDER — IBUPROFEN 600 MG PO TABS: 600.0000 mg | ORAL_TABLET | Freq: Four times a day (QID) | ORAL | 0 refills | Status: DC | PRN

## 2017-10-20 MED ORDER — SODIUM CHLORIDE 0.9 % IV BOLUS
1000.0000 mL | Freq: Once | INTRAVENOUS | Status: AC
  Administered 2017-10-20: 1000 mL via INTRAVENOUS

## 2017-10-20 MED ORDER — ONDANSETRON HCL 4 MG/2ML IJ SOLN
4.0000 mg | Freq: Once | INTRAMUSCULAR | Status: AC
  Administered 2017-10-20: 4 mg via INTRAVENOUS
  Filled 2017-10-20: qty 2

## 2017-10-20 NOTE — Discharge Instructions (Signed)
Avoid fried foods, fatty foods, greasy foods, and milk products until symptoms resolve. Be sure that you drink plenty of clear liquids. We recommend the use of Phenergan as prescribed for nausea/vomiting. Follow-up with your primary care doctor to ensure resolution of symptoms. ° °

## 2017-10-20 NOTE — ED Provider Notes (Signed)
[facility] EMERGENCY DEPARTMENT Provider Note   CSN: [id number] Arrival date & time: 10/19/17  2322     History   Chief Complaint Chief Complaint  Patient presents with  . Abdominal Pain  . Emesis    HPI Kathryn Shelton is a 27 y.o. female.   27 year old female presents to the emergency department for evaluation of nausea, vomiting, diarrhea.  Symptoms began yesterday evening.  She has had persistent, watery, nonbloody diarrhea with one episode of large-volume emesis yesterday afternoon.  She has been hesitant to eat or drink since onset of vomiting.  She does note generalized, intermittent abdominal discomfort.  She has not taken any medications prior to arrival.  No dysuria, hematuria, documented fever over 100.70F.  Denies any recent sick contacts, travel, or antibiotic use.  No hx of abdominal surgeries.     Past Medical History:  Diagnosis Date  . Diabetes mellitus without complication ([place])   . Hypertension     There are no active problems to display for this patient.   No past surgical history on file.   OB History   None      Home Medications    Prior to Admission medications   Medication Sig Start Date End Date Taking? Authorizing Provider  Blood Glucose Monitoring Suppl (ACCU-CHEK AVIVA PLUS) w/Device KIT Check blood sugars fasting and at bedtime 10/25/81   Delora Fuel, MD  glucose blood (ACCU-CHEK AVIVA) test strip Use as instructed 10/30/27   Delora Fuel, MD  ibuprofen (ADVIL,MOTRIN) 600 MG tablet Take 1 tablet (600 mg total) by mouth every 6 (six) hours as needed. 10/20/17   Antonietta Breach, PA-C  Insulin Glargine (LANTUS) 100 UNIT/ML Solostar Pen Inject 10 Units into the skin daily at 10 pm. 08/31/63   Delora Fuel, MD  Lancet Devices [facility]) lancets Use as instructed 08/29/48   Delora Fuel, MD  metFORMIN (GLUCOPHAGE) 500 MG tablet Take 1 tablet (500 mg total) by mouth 2 (two) times daily with a meal. 07/28/44   Delora Fuel, MD    promethazine (PHENERGAN) 25 MG tablet Take 1 tablet (25 mg total) by mouth every 6 (six) hours as needed for nausea or vomiting. 10/20/17   Antonietta Breach, PA-C    Family History Family History  Family history unknown: Yes    Social History Social History   Tobacco Use  . Smoking status: Never Smoker  . Smokeless tobacco: Never Used  Substance Use Topics  . Alcohol use: No  . Drug use: No     Allergies   Shellfish-derived products   Review of Systems Review of Systems Ten systems reviewed and are negative for acute change, except as noted in the HPI.    Physical Exam Updated Vital Signs BP 122/85 (BP Location: Right Arm)   Pulse 88   Temp 99.4 F (37.4 C) (Oral)   Resp 16   Ht _0  (1.676 m)   LMP  (LMP Unknown)   SpO2 98%   BMI 37.12 kg/m   Physical Exam  Constitutional: She is oriented to person, place, and time. She appears well-developed and well-nourished. No distress.  Patient calm and in no acute distress.  Nontoxic.  HENT:  Head: Normocephalic and atraumatic.  Eyes: Conjunctivae and EOM are normal. No scleral icterus.  Neck: Normal range of motion.  Cardiovascular: Normal rate, regular rhythm and intact distal pulses.  Pulmonary/Chest: Effort normal. No stridor. No respiratory distress. She has no wheezes.  Respirations even and unlabored.  Abdominal:  Abdomen  soft, obese.  No palpable masses or peritoneal signs.  No focal tenderness.  Musculoskeletal: Normal range of motion.  Neurological: She is alert and oriented to person, place, and time. She exhibits normal muscle tone. Coordination normal.  Skin: Skin is warm and dry. No rash noted. She is not diaphoretic. No erythema. No pallor.  Psychiatric: She has a normal mood and affect. Her behavior is normal.  Nursing note and vitals reviewed.    ED Treatments / Results  Labs (all labs ordered are listed, but only abnormal results are displayed) Labs Reviewed  COMPREHENSIVE METABOLIC PANEL -  Abnormal; Notable for the following components:      Result Value   Glucose, Bld 260 (*)    Calcium 8.5 (*)    All other components within normal limits  CBC - Abnormal; Notable for the following components:   WBC 3.0 (*)    RBC 5.26 (*)    All other components within normal limits  URINALYSIS, ROUTINE W REFLEX MICROSCOPIC - Abnormal; Notable for the following components:   Hgb urine dipstick SMALL (*)    Protein, ur 100 (*)    All other components within normal limits  LIPASE, BLOOD  I-STAT BETA HCG BLOOD, ED ([facility], [facility], [facility] ONLY)    EKG None  Radiology No results found.  Procedures Procedures (including critical care time)  Medications Ordered in ED Medications  sodium chloride 0.9 % bolus 1,000 mL (1,000 mLs Intravenous New Bag/Given 10/20/17 0333)  ondansetron (ZOFRAN) injection 4 mg (4 mg Intravenous Given 10/20/17 0335)    4:37 AM Patient resting comfortably, snoring.  Water given for fluid challenge.  Anticipate discharge if able to tolerate.  No visualized emesis since being bedded in the ED.   Initial Impression / Assessment and Plan / ED Course  I have reviewed the triage vital signs and the nursing notes.  Pertinent labs & imaging results that were available during my care of the patient were reviewed by me and considered in my medical decision making (see chart for details).     Patient with symptoms consistent with viral gastroenteritis.  Vitals are stable, no fever.  No signs of dehydration, tolerating PO fluids.  Lungs are clear.  No focal abdominal pain, leukocytosis.  Doubt appendicitis, cholecystitis, pancreatitis, ruptured viscus, UTI, kidney stone, or other concerning abdominal etiology.  Supportive therapy indicated with return if symptoms worsen.  Return precautions discussed and provided. Patient discharged in stable condition with no unaddressed concerns.   Final Clinical Impressions(s) / ED Diagnoses   Final diagnoses:  Gastroenteritis    ED  Discharge Orders        Ordered    promethazine (PHENERGAN) 25 MG tablet  Every 6 hours PRN     10/20/17 0431    ibuprofen (ADVIL,MOTRIN) 600 MG tablet  Every 6 hours PRN     10/20/17 0431       Antonietta Breach, PA-C 65/78/46 [id number]    Delora Fuel, MD 52/84/13 [phone]

## 2017-10-20 NOTE — ED Notes (Signed)
Pt reports vomiting 1 time, having abd pains all over, and diarrhea. Pt states this all started 1 day ago.

## 2017-12-02 DIAGNOSIS — E782 Mixed hyperlipidemia: Secondary | ICD-10-CM | POA: Insufficient documentation

## 2017-12-10 ENCOUNTER — Encounter: Payer: Self-pay | Admitting: General Practice

## 2017-12-10 ENCOUNTER — Ambulatory Visit (INDEPENDENT_AMBULATORY_CARE_PROVIDER_SITE_OTHER): Payer: Medicaid Other | Admitting: Obstetrics and Gynecology

## 2017-12-10 VITALS — BP 131/82 | HR 97 | Resp 18 | Ht 66.0 in | Wt 224.8 lb

## 2017-12-10 DIAGNOSIS — Z3201 Encounter for pregnancy test, result positive: Secondary | ICD-10-CM

## 2017-12-10 DIAGNOSIS — Z3200 Encounter for pregnancy test, result unknown: Secondary | ICD-10-CM

## 2017-12-10 LAB — POCT URINE PREGNANCY: Preg Test, Ur: POSITIVE — AB

## 2017-12-10 MED ORDER — RANITIDINE HCL 150 MG PO TABS: 150.0000 mg | ORAL_TABLET | Freq: Two times a day (BID) | ORAL | 0 refills | Status: DC

## 2017-12-10 MED ORDER — PRENATAL ADULT GUMMY/DHA/FA 0.4-25 MG PO CHEW: 1.0000 | CHEWABLE_TABLET | Freq: Every day | ORAL | 12 refills | Status: DC

## 2017-12-10 MED ORDER — DOXYLAMINE-PYRIDOXINE 10-10 MG PO TBEC: 2.0000 | DELAYED_RELEASE_TABLET | Freq: Every day | ORAL | 0 refills | Status: DC

## 2017-12-10 MED ORDER — METOCLOPRAMIDE HCL 10 MG PO TABS
10.0000 mg | ORAL_TABLET | Freq: Four times a day (QID) | ORAL | 1 refills | Status: DC
Start: 2017-12-10 — End: 2018-03-29

## 2017-12-10 NOTE — Progress Notes (Signed)
..   [name] presents today for UPT. She has no unusual complaints and complains of nausea with vomiting for 1 month. LMP: 10/07/2017    OBJECTIVE: Appears well, in no apparent distress.  OB History   None    Home UPT Result: Positive In-Office UPT result: Positive I have reviewed the patient's medical, obstetrical, social, and family histories, and medications.   ASSESSMENT: Positive pregnancy test  PLAN [facility]renatal [facility] to be completed at: [facility].  Meds ordered this encounter  Medications  . Prenatal MV & Min w/FA-DHA (PRENATAL ADULT GUMMY/DHA/FA) 0.4-25 MG CHEW    Sig: Chew 1 tablet by mouth daily.    Dispense:  30 tablet    Refill:  12    Order Specific Question:   Supervising Provider    Answer:   Reva BoresPRATT, TANYA S [[id number]]  . Doxylamine-Pyridoxine 10-10 MG TBEC    Sig: Take 2 tablets by mouth at bedtime.    Dispense:  60 tablet    Refill:  0    Order Specific Question:   Supervising Provider    Answer:   Reva BoresPRATT, TANYA S [[id number]]  . ranitidine (ZANTAC) 150 MG tablet    Sig: Take 1 tablet (150 mg total) by mouth 2 (two) times daily.    Dispense:  60 tablet    Refill:  0    Order Specific Question:   Supervising Provider    Answer:   Reva BoresPRATT, TANYA S [[id number]]  . metoCLOPramide (REGLAN) 10 MG tablet    Sig: Take 1 tablet (10 mg total) by mouth every 6 (six) hours.    Dispense:  120 tablet    Refill:  1    Order Specific Question:   Supervising Provider    Answer:   Samara SnidePRATT, TANYA S [[id number]]

## 2017-12-11 ENCOUNTER — Other Ambulatory Visit: Payer: Self-pay

## 2017-12-11 ENCOUNTER — Encounter (HOSPITAL_COMMUNITY): Payer: Self-pay | Admitting: Emergency Medicine

## 2017-12-11 ENCOUNTER — Emergency Department (HOSPITAL_COMMUNITY)
Admission: EM | Admit: 2017-12-11 | Discharge: 2017-12-11 | Disposition: A | Discharge status: Home / Self Care | Payer: Medicaid Other | Attending: Emergency Medicine | Admitting: Emergency Medicine

## 2017-12-11 DIAGNOSIS — Z5321 Procedure and treatment not carried out due to patient leaving prior to being seen by health care provider: Secondary | ICD-10-CM | POA: Diagnosis not present

## 2017-12-11 DIAGNOSIS — O219 Vomiting of pregnancy, unspecified: Secondary | ICD-10-CM | POA: Diagnosis present

## 2017-12-11 LAB — CBC
HCT: 41.4 % (ref 36.0–46.0)
Hemoglobin: 13.2 g/dL (ref 12.0–15.0)
MCH: 26.7 pg (ref 26.0–34.0)
MCHC: 31.9 g/dL (ref 30.0–36.0)
MCV: 83.8 fL (ref 78.0–100.0)
Platelets: 346 10*3/uL (ref 150–400)
RBC: 4.94 MIL/uL (ref 3.87–5.11)
RDW: 13.3 % (ref 11.5–15.5)
WBC: 4.8 10*3/uL (ref 4.0–10.5)

## 2017-12-11 LAB — LIPASE, BLOOD: Lipase: 30 U/L (ref 11–51)

## 2017-12-11 LAB — COMPREHENSIVE METABOLIC PANEL
ALBUMIN: 3.3 g/dL — AB (ref 3.5–5.0)
ALT: 12 U/L (ref 0–44)
AST: 16 U/L (ref 15–41)
Alkaline Phosphatase: 69 U/L (ref 38–126)
Anion gap: 11 (ref 5–15)
BILIRUBIN TOTAL: 0.6 mg/dL (ref 0.3–1.2)
BUN: 7 mg/dL (ref 6–20)
CALCIUM: 8.9 mg/dL (ref 8.9–10.3)
CO2: 23 mmol/L (ref 22–32)
Chloride: 105 mmol/L (ref 98–111)
Creatinine, Ser: 0.84 mg/dL (ref 0.44–1.00)
GFR calc non Af Amer: >60 mL/min (ref >60)
Glucose, Bld: 180 mg/dL — ABNORMAL HIGH (ref 70–99)
Potassium: 3.7 mmol/L (ref 3.5–5.1)
Sodium: 139 mmol/L (ref 135–145)
TOTAL PROTEIN: 6.5 g/dL (ref 6.5–8.1)

## 2017-12-11 LAB — HCG, SERUM, QUALITATIVE: PREG SERUM: POSITIVE — AB

## 2017-12-11 NOTE — ED Notes (Signed)
Called for pt x2, no response. Notified Tina(RN)

## 2017-12-11 NOTE — ED Notes (Signed)
Follow up call made  No answer  12/11/17  1104  s Fumiye Lubben rn

## 2017-12-11 NOTE — ED Provider Notes (Signed)
Pt LWBS.  I was not directly involved in pt care.    Fayrene Helperran, Dashon Mcintire, PA-C 12/11/17 [phone]    Cathren LaineSteinl, Kevin, MD 12/11/17 [phone]

## 2017-12-11 NOTE — ED Notes (Signed)
Pt did not respond when called for room X 3  

## 2017-12-11 NOTE — ED Triage Notes (Signed)
Pt BIB [facility], c/o n/v x 2 days. Pt [redacted] weeks pregnant. VSS.

## 2017-12-11 NOTE — ED Notes (Signed)
Pt did not respond when called for room 3X

## 2017-12-17 ENCOUNTER — Telehealth: Payer: Self-pay | Admitting: *Deleted

## 2017-12-17 NOTE — Telephone Encounter (Signed)
PA approved for Doxylamine-Pyridoxine 10-10 MG. Pharmacy made aware.

## 2017-12-22 ENCOUNTER — Encounter (HOSPITAL_COMMUNITY): Payer: Self-pay | Admitting: Emergency Medicine

## 2017-12-22 ENCOUNTER — Emergency Department (HOSPITAL_COMMUNITY)
Admission: EM | Admit: 2017-12-22 | Discharge: 2017-12-22 | Disposition: A | Discharge status: Home / Self Care | Payer: Medicaid Other | Attending: Emergency Medicine | Admitting: Emergency Medicine

## 2017-12-22 ENCOUNTER — Emergency Department (HOSPITAL_COMMUNITY): Payer: Medicaid Other

## 2017-12-22 DIAGNOSIS — Z79899 Other long term (current) drug therapy: Secondary | ICD-10-CM | POA: Diagnosis not present

## 2017-12-22 DIAGNOSIS — O10011 Pre-existing essential hypertension complicating pregnancy, first trimester: Secondary | ICD-10-CM | POA: Insufficient documentation

## 2017-12-22 DIAGNOSIS — Z3A08 8 weeks gestation of pregnancy: Secondary | ICD-10-CM | POA: Insufficient documentation

## 2017-12-22 DIAGNOSIS — O24011 Pre-existing diabetes mellitus, type 1, in pregnancy, first trimester: Secondary | ICD-10-CM | POA: Diagnosis not present

## 2017-12-22 DIAGNOSIS — O219 Vomiting of pregnancy, unspecified: Secondary | ICD-10-CM

## 2017-12-22 DIAGNOSIS — Z3201 Encounter for pregnancy test, result positive: Secondary | ICD-10-CM

## 2017-12-22 DIAGNOSIS — R109 Unspecified abdominal pain: Secondary | ICD-10-CM

## 2017-12-22 DIAGNOSIS — O218 Other vomiting complicating pregnancy: Secondary | ICD-10-CM | POA: Diagnosis not present

## 2017-12-22 DIAGNOSIS — Z794 Long term (current) use of insulin: Secondary | ICD-10-CM | POA: Insufficient documentation

## 2017-12-22 DIAGNOSIS — O9989 Other specified diseases and conditions complicating pregnancy, childbirth and the puerperium: Secondary | ICD-10-CM | POA: Diagnosis present

## 2017-12-22 LAB — URINALYSIS, ROUTINE W REFLEX MICROSCOPIC
BACTERIA UA: NONE SEEN
Bilirubin Urine: NEGATIVE
GLUCOSE, UA: NEGATIVE mg/dL
Hgb urine dipstick: NEGATIVE
Ketones, ur: 80 mg/dL — AB
Leukocytes, UA: NEGATIVE
Nitrite: NEGATIVE
Protein, ur: 30 mg/dL — AB
Specific Gravity, Urine: 1.016 (ref 1.005–1.030)
pH: 5 (ref 5.0–8.0)

## 2017-12-22 LAB — COMPREHENSIVE METABOLIC PANEL
ALK PHOS: 79 U/L (ref 38–126)
ALT: 18 U/L (ref 0–44)
AST: 19 U/L (ref 15–41)
Albumin: 3.7 g/dL (ref 3.5–5.0)
Anion gap: 10 (ref 5–15)
BUN: 6 mg/dL (ref 6–20)
CALCIUM: 9.6 mg/dL (ref 8.9–10.3)
CO2: 24 mmol/L (ref 22–32)
CREATININE: 0.92 mg/dL (ref 0.44–1.00)
Chloride: 105 mmol/L (ref 98–111)
GFR calc Af Amer: >60 mL/min (ref >60)
GFR calc non Af Amer: >60 mL/min (ref >60)
Glucose, Bld: 190 mg/dL — ABNORMAL HIGH (ref 70–99)
Potassium: 3.5 mmol/L (ref 3.5–5.1)
SODIUM: 139 mmol/L (ref 135–145)
Total Bilirubin: 0.8 mg/dL (ref 0.3–1.2)
Total Protein: 7.8 g/dL (ref 6.5–8.1)

## 2017-12-22 LAB — CBC WITH DIFFERENTIAL/PLATELET
Abs Immature Granulocytes: 0 K/uL (ref 0.0–0.1)
Basophils Absolute: 0 K/uL (ref 0.0–0.1)
Basophils Relative: 0 %
Eosinophils Absolute: 0 K/uL (ref 0.0–0.7)
Eosinophils Relative: 0 %
HCT: 43.5 % (ref 36.0–46.0)
Hemoglobin: 14.2 g/dL (ref 12.0–15.0)
Immature Granulocytes: 0 %
Lymphocytes Relative: 35 %
Lymphs Abs: 1.6 K/uL (ref 0.7–4.0)
MCH: 26.7 pg (ref 26.0–34.0)
MCHC: 32.6 g/dL (ref 30.0–36.0)
MCV: 81.9 fL (ref 78.0–100.0)
Monocytes Absolute: 0.4 K/uL (ref 0.1–1.0)
Monocytes Relative: 8 %
Neutro Abs: 2.6 K/uL (ref 1.7–7.7)
Neutrophils Relative %: 57 %
Platelets: 346 K/uL (ref 150–400)
RBC: 5.31 MIL/uL — ABNORMAL HIGH (ref 3.87–5.11)
RDW: 12.9 % (ref 11.5–15.5)
WBC: 4.7 K/uL (ref 4.0–10.5)

## 2017-12-22 LAB — I-STAT BETA HCG BLOOD, ED (MC, WL, AP ONLY): I-stat hCG, quantitative: >2000 m[IU]/mL — ABNORMAL HIGH

## 2017-12-22 LAB — CBG MONITORING, ED: GLUCOSE-CAPILLARY: 200 mg/dL — AB (ref 70–99)

## 2017-12-22 LAB — LIPASE, BLOOD: Lipase: 31 U/L (ref 11–51)

## 2017-12-22 LAB — HCG, QUANTITATIVE, PREGNANCY: hCG, Beta Chain, Quant, S: 132958 m[IU]/mL — ABNORMAL HIGH

## 2017-12-22 MED ORDER — SODIUM CHLORIDE 0.9 % IV BOLUS (SEPSIS)
1000.0000 mL | Freq: Once | INTRAVENOUS | Status: AC
  Administered 2017-12-22: 1000 mL via INTRAVENOUS

## 2017-12-22 MED ORDER — METOCLOPRAMIDE HCL 5 MG/ML IJ SOLN
10.0000 mg | Freq: Once | INTRAMUSCULAR | Status: AC
Start: 2017-12-22 — End: 2017-12-22
  Administered 2017-12-22: 10 mg via INTRAVENOUS
  Filled 2017-12-22: qty 2

## 2017-12-22 MED ORDER — SODIUM CHLORIDE 0.9 % IV SOLN: 1000.0000 mL | INTRAVENOUS | Status: DC

## 2017-12-22 MED ORDER — ACETAMINOPHEN 325 MG PO TABS
650.0000 mg | ORAL_TABLET | Freq: Once | ORAL | Status: AC
  Administered 2017-12-22: 650 mg via ORAL
  Filled 2017-12-22: qty 2

## 2017-12-22 MED ORDER — DOXYLAMINE-PYRIDOXINE 10-10 MG PO TBEC: 2.0000 | DELAYED_RELEASE_TABLET | Freq: Every day | ORAL | 0 refills | Status: DC

## 2017-12-22 MED ORDER — PYRIDOXINE HCL 100 MG/ML IJ SOLN
100.0000 mg | Freq: Once | INTRAMUSCULAR | Status: AC
  Administered 2017-12-22: 100 mg via INTRAVENOUS
  Filled 2017-12-22: qty 1

## 2017-12-22 MED ORDER — LACTATED RINGERS IV BOLUS: 1000.0000 mL | Freq: Once | INTRAVENOUS | Status: DC

## 2017-12-22 NOTE — Discharge Instructions (Signed)
Your ultrasound and blood work today were reassuring.  Your blood sugar was elevated in the ER today (190), it is very important that you follow-up with [facility] regarding your blood sugar as it is important that it is tightly controlled during pregnancy for safety of you and the baby.  I have written you a prescription for a medicine to help with nausea/vomiting.  Please take 2 tablets nightly.  Scheduled appointment with your OB/GYN if your symptoms are not improving.  Return to the emergency department immediately if you have worsening abdominal pain, profuse vaginal bleeding, vomiting that will not stop despite taking medications.

## 2017-12-22 NOTE — ED Provider Notes (Signed)
Medical screening examination/treatment/procedure(s) were conducted as a shared visit with non-physician practitioner(s) and myself.  I personally evaluated the patient during the encounter. Briefly, the patient is a [age] G3P2 here with N/V/abd pain. Suspect hyperemesis gravidarum. No focal TTP to to suggest appendicitis. Pregnancy of unknown location, however, so will check U/S, give fluids, antiemetics. [facility]   EKG Interpretation None          Shaune PollackIsaacs, Nuri Larmer, MD 12/22/17 [phone]

## 2017-12-22 NOTE — ED Provider Notes (Addendum)
[place] EMERGENCY DEPARTMENT Provider Note   CSN: [id number] Arrival date & time: 12/22/17  4098     History   Chief Complaint Chief Complaint  Patient presents with  . Abdominal Pain    HPI Kathryn Shelton is a 27 y.o. female.  HPI   Kathryn Shelton is a 27 year old G66P2 female who is [redacted] weeks pregnant with a history of insulin-dependent diabetes, hypertension who presents to the emergency department for evaluation of generalized weakness, nausea/vomiting and generalized abdominal pain.  Patient reports that for the last 2 weeks she has had nausea, worse in the morning with several episodes of vomiting daily.  She has established care with [facility] in [place] and has been prescribed a medicine for nausea, but she cannot member the name of it.  She states that despite taking it she continues to feel nauseated and has vomiting.  States that she felt this way in prior pregnancies.  This morning she developed generalized 8/10 severity cramping abdominal pain.  She reports that she has not taken any over-the-counter medications for her symptoms. Has not had an ultrasound. Denies fever, chills, vaginal bleeding, dysuria, urinary frequency, hematuria, hematemesis, diarrhea, sob, chest pain, leg swelling, lightheadedness, syncope.  No prior abdominal surgeries.  Past Medical History:  Diagnosis Date  . Diabetes mellitus without complication ([place])   . Hypertension     There are no active problems to display for this patient.   History reviewed. No pertinent surgical history.   OB History    Gravida  1   Para      Term      Preterm      AB      Living        SAB      TAB      Ectopic      Multiple      Live Births               Home Medications    Prior to Admission medications   Medication Sig Start Date End Date Taking? Authorizing Provider  Insulin Glargine (LANTUS) 100 UNIT/ML Solostar Pen Inject 10 Units into the skin daily  at 10 pm. Patient taking differently: Inject 14 Units into the skin daily at 10 pm.  05/26/89  Yes Delora Fuel, MD  metFORMIN (GLUCOPHAGE) 500 MG tablet Take 1 tablet (500 mg total) by mouth 2 (two) times daily with a meal. 08/30/80  Yes Delora Fuel, MD  Blood Glucose Monitoring Suppl (ACCU-CHEK AVIVA PLUS) w/Device KIT Check blood sugars fasting and at bedtime 01/28/61   Delora Fuel, MD  Doxylamine-Pyridoxine 10-10 MG TBEC Take 2 tablets by mouth at bedtime. Patient not taking: Reported on 12/22/2017 12/10/17   Laury Deep, CNM  glucose blood (ACCU-CHEK AVIVA) test strip Use as instructed 05/28/06   Delora Fuel, MD  ibuprofen (ADVIL,MOTRIN) 600 MG tablet Take 1 tablet (600 mg total) by mouth every 6 (six) hours as needed. Patient not taking: Reported on 12/22/2017 10/20/17   Antonietta Breach, PA-C  Lancet Devices (ACCU-CHEK [facility]) lancets Use as instructed 10/28/76   Delora Fuel, MD  metoCLOPramide (REGLAN) 10 MG tablet Take 1 tablet (10 mg total) by mouth every 6 (six) hours. Patient not taking: Reported on 12/22/2017 12/10/17   Laury Deep, CNM  Prenatal MV & Min w/FA-DHA (PRENATAL ADULT GUMMY/DHA/FA) 0.4-25 MG CHEW Chew 1 tablet by mouth daily. Patient not taking: Reported on 12/22/2017 12/10/17   Laury Deep, CNM  promethazine (PHENERGAN) 25 MG  tablet Take 1 tablet (25 mg total) by mouth every 6 (six) hours as needed for nausea or vomiting. Patient not taking: Reported on 12/22/2017 10/20/17   Antonietta Breach, PA-C  ranitidine (ZANTAC) 150 MG tablet Take 1 tablet (150 mg total) by mouth 2 (two) times daily. Patient not taking: Reported on 12/22/2017 12/10/17   Laury Deep, CNM    Family History Family History  Family history unknown: Yes    Social History Social History   Tobacco Use  . Smoking status: Never Smoker  . Smokeless tobacco: Never Used  Substance Use Topics  . Alcohol use: No  . Drug use: No     Allergies   Shellfish-derived products   Review of Systems Review of  Systems  Constitutional: Negative for chills and fever.  Eyes: Negative for visual disturbance.  Respiratory: Negative for shortness of breath.   Cardiovascular: Negative for chest pain.  Gastrointestinal: Positive for abdominal pain (generalized), nausea and vomiting. Negative for blood in stool and diarrhea.  Genitourinary: Negative for difficulty urinating, dysuria, frequency, hematuria, vaginal bleeding and vaginal discharge.  Musculoskeletal: Negative for gait problem.  Skin: Negative for rash.  Neurological: Positive for weakness (generalized). Negative for syncope, light-headedness and numbness.  Psychiatric/Behavioral: Negative for agitation.     Physical Exam Updated Vital Signs BP (!) 129/91 (BP Location: Right Arm)   Pulse 100   Temp 98.9 F (37.2 C) (Oral)   Resp 15   Ht 5' 5"  (1.651 m)   Wt 102.1 kg (225 lb)   LMP 10/07/2017 (Approximate)   SpO2 98%   BMI 37.44 kg/m   Physical Exam  Constitutional: She is oriented to person, place, and time. She appears well-developed and well-nourished. No distress.  Sitting at bedside in no apparent distress, non-toxic.   HENT:  Head: Normocephalic and atraumatic.  Mucous membranes somewhat dry.   Eyes: Pupils are equal, round, and reactive to light. Conjunctivae are normal. Right eye exhibits no discharge. Left eye exhibits no discharge.  Neck: Normal range of motion.  Cardiovascular: Normal rate, regular rhythm and intact distal pulses.  Pulmonary/Chest: Effort normal and breath sounds normal. No stridor. No respiratory distress. She has no wheezes. She has no rales.  Abdominal:  Abdomen soft and nondistended.  Diffusely tender to palpation over the abdomen, no focal tenderness.  No guarding, rigidity or rebound tenderness.  Negative McBurney's point.  No CVA tenderness.  Musculoskeletal: Normal range of motion.  Neurological: She is alert and oriented to person, place, and time. Coordination normal.  Skin: Skin is warm and  dry. She is not diaphoretic.  Psychiatric: She has a normal mood and affect. Her behavior is normal.  Nursing note and vitals reviewed.   ED Treatments / Results  Labs (all labs ordered are listed, but only abnormal results are displayed) Labs Reviewed  CBC WITH DIFFERENTIAL/PLATELET - Abnormal; Notable for the following components:      Result Value   RBC 5.31 (*)    All other components within normal limits  COMPREHENSIVE METABOLIC PANEL - Abnormal; Notable for the following components:   Glucose, Bld 190 (*)    All other components within normal limits  URINALYSIS, ROUTINE W REFLEX MICROSCOPIC - Abnormal; Notable for the following components:   Ketones, ur 80 (*)    Protein, ur 30 (*)    All other components within normal limits  HCG, QUANTITATIVE, PREGNANCY - Abnormal; Notable for the following components:   hCG, Beta Chain, Quant, S 132,958 (*)    All  other components within normal limits  CBG MONITORING, ED - Abnormal; Notable for the following components:   Glucose-Capillary 200 (*)    All other components within normal limits  I-STAT BETA HCG BLOOD, ED (MC, WL, AP ONLY) - Abnormal; Notable for the following components:   I-stat hCG, quantitative >2,000.0 (*)    All other components within normal limits  LIPASE, BLOOD    EKG None  Radiology US Ob Comp < 14 Wks  Result Date: 12/22/2017 CLINICAL DATA:  Abdominal pain EXAM: OBSTETRIC <14 WK [place] AND TRANSVAGINAL OB US TECHNIQUE: Both transabdominal and transvaginal ultrasound examinations were performed for complete evaluation of the gestation as well as the maternal uterus, adnexal regions, and pelvic cul-de-sac. Transvaginal technique was performed to assess early pregnancy. COMPARISON:  None. FINDINGS: Intrauterine gestational sac: Single Yolk sac:  Visualized Embryo:  Visualized Cardiac Activity: visualized Heart Rate: 163 bpm MSD:   mm    w     d CRL:  18.3 mm   8 w   2 d                  [place] EDC: 08/01/2018 Subchorionic  hemorrhage:  No subchorionic hemorrhage. Maternal uterus/adnexae: No adnexal mass or free fluid. IMPRESSION: Eight week 2 day intrauterine pregnancy. Fetal heart rate 163 beats per minute. No acute maternal findings. Electronically Signed   By: Rolm Baptise M.D.   On: 12/22/2017 09:13   US Ob Transvaginal  Result Date: 12/22/2017 CLINICAL DATA:  Abdominal pain EXAM: OBSTETRIC <14 WK [place] AND TRANSVAGINAL OB US TECHNIQUE: Both transabdominal and transvaginal ultrasound examinations were performed for complete evaluation of the gestation as well as the maternal uterus, adnexal regions, and pelvic cul-de-sac. Transvaginal technique was performed to assess early pregnancy. COMPARISON:  None. FINDINGS: Intrauterine gestational sac: Single Yolk sac:  Visualized Embryo:  Visualized Cardiac Activity: visualized Heart Rate: 163 bpm MSD:   mm    w     d CRL:  18.3 mm   8 w   2 d                  [place] EDC: 08/01/2018 Subchorionic hemorrhage:  No subchorionic hemorrhage. Maternal uterus/adnexae: No adnexal mass or free fluid. IMPRESSION: Eight week 2 day intrauterine pregnancy. Fetal heart rate 163 beats per minute. No acute maternal findings. Electronically Signed   By: Rolm Baptise M.D.   On: 12/22/2017 09:13    Procedures Procedures (including critical care time)  Medications Ordered in ED Medications  sodium chloride 0.9 % bolus 1,000 mL (1,000 mLs Intravenous New Bag/Given 12/22/17 0653)    Followed by  0.9 %  sodium chloride infusion ( Intravenous Canceled Entry 12/22/17 0653)  pyridOXINE (B-6) injection 100 mg (has no administration in time range)  metoCLOPramide (REGLAN) injection 10 mg (has no administration in time range)     Initial Impression / Assessment and Plan / ED Course  I have reviewed the triage vital signs and the nursing notes.  Pertinent labs & imaging results that were available during my care of the patient were reviewed by me and considered in my medical decision making (see chart for  details).     Patient who is [redacted]wks pregnant presents to the ED for evaluation of nausea, vomiting and generalized abdominal pain.  She denies fevers, chills or vaginal bleeding.  On exam she is afebrile and nontoxic-appearing.  Abdomen soft and mild diffuse tenderness, no peritoneal signs.  No concern for acute surgical abdomen.  She  reports that she has had similar symptoms in prior pregnancies.  Lab work reveals beta hCG [id number].  CBC without leukocytosis and Hgb within normal limits.  CMP reveals elevated glucose, (bg 190) patient known diabetic.  No anion gap or concern for DKA.  Liver enzymes and creatinine WNL.  Lipase WNL.  Transvaginal US reveals intrauterine pregnancy 8wks2d with fetal heart rate 163bpm. No ectopic pregnancy. Patient treated with fluids, vitamin B6, Reglan and tylenol for pain.  On recheck she reports improvement in her symptoms.  Repeat abdominal exam soft and non-tender to palpation.  She is tolerating po fluids and eating graham crackers at the bedside.  Based on symptoms, lab work and imaging I do not suspect acute appendicitis, cholecystitis, bowel obstruction, perforated viscus, ovarian torsion or other acute intraabdominal etiology requiring further work up. Plan to discharge home with diclegis and instructions to follow up with OBGYN.  Have also counseled her to follow-up with her OB/GYN regarding her blood sugar as it was elevated in the emergency department.  We talked about the importance of managing hyperglycemia during pregnancy.  Counseled her on return precautions and she agrees and voiced understanding to the above plan and appears reliable for follow-up.  This was a shared visit with Dr. Ellender Hose who also saw the patient and agrees with plan to discharge home.  Final Clinical Impressions(s) / ED Diagnoses   Final diagnoses:  [redacted] weeks gestation of pregnancy  Nausea and vomiting during pregnancy    ED Discharge Orders        Ordered    Doxylamine-Pyridoxine  10-10 MG TBEC  Daily at bedtime     12/22/17 1138       Glyn Ade, PA-C 12/22/17 1143    Glyn Ade, PA-C 12/22/17 1145    Duffy Bruce, MD 12/23/17 1005

## 2017-12-22 NOTE — ED Triage Notes (Signed)
BIB EMS from home, reports gen abd pain, nausea, weakness X2 weeks. Pt is approx [redacted] weeks pregnant. Seen here recently for same but LWBS.

## 2017-12-22 NOTE — ED Notes (Signed)
Patient given water and graham crackers

## 2017-12-28 ENCOUNTER — Other Ambulatory Visit (HOSPITAL_BASED_OUTPATIENT_CLINIC_OR_DEPARTMENT_OTHER): Payer: Self-pay

## 2017-12-28 DIAGNOSIS — R0683 Snoring: Secondary | ICD-10-CM

## 2017-12-28 DIAGNOSIS — G4710 Hypersomnia, unspecified: Secondary | ICD-10-CM

## 2017-12-28 DIAGNOSIS — G4730 Sleep apnea, unspecified: Secondary | ICD-10-CM

## 2017-12-31 ENCOUNTER — Inpatient Hospital Stay (HOSPITAL_COMMUNITY)
Admission: AD | Admit: 2017-12-31 | Discharge: 2017-12-31 | Disposition: A | Discharge status: Home / Self Care | Payer: Medicaid Other | Source: Ambulatory Visit | Attending: Obstetrics & Gynecology | Admitting: Obstetrics & Gynecology

## 2017-12-31 ENCOUNTER — Encounter (HOSPITAL_COMMUNITY): Payer: Self-pay | Admitting: *Deleted

## 2017-12-31 ENCOUNTER — Encounter: Payer: Self-pay | Admitting: Obstetrics and Gynecology

## 2017-12-31 ENCOUNTER — Other Ambulatory Visit (HOSPITAL_COMMUNITY)
Admission: RE | Admit: 2017-12-31 | Discharge: 2017-12-31 | Disposition: A | Discharge status: Home / Self Care | Payer: Medicaid Other | Source: Ambulatory Visit | Attending: Obstetrics and Gynecology | Admitting: Obstetrics and Gynecology

## 2017-12-31 ENCOUNTER — Encounter: Payer: Self-pay | Admitting: General Practice

## 2017-12-31 ENCOUNTER — Ambulatory Visit (INDEPENDENT_AMBULATORY_CARE_PROVIDER_SITE_OTHER): Payer: Medicaid Other | Admitting: Obstetrics and Gynecology

## 2017-12-31 ENCOUNTER — Other Ambulatory Visit: Payer: Self-pay

## 2017-12-31 VITALS — BP 107/80 | HR 72 | Temp 98.4°F | Wt 204.0 lb

## 2017-12-31 DIAGNOSIS — O24911 Unspecified diabetes mellitus in pregnancy, first trimester: Secondary | ICD-10-CM | POA: Diagnosis not present

## 2017-12-31 DIAGNOSIS — Z79899 Other long term (current) drug therapy: Secondary | ICD-10-CM | POA: Insufficient documentation

## 2017-12-31 DIAGNOSIS — IMO0001 Reserved for inherently not codable concepts without codable children: Secondary | ICD-10-CM

## 2017-12-31 DIAGNOSIS — O0991 Supervision of high risk pregnancy, unspecified, first trimester: Secondary | ICD-10-CM | POA: Insufficient documentation

## 2017-12-31 DIAGNOSIS — O161 Unspecified maternal hypertension, first trimester: Secondary | ICD-10-CM | POA: Insufficient documentation

## 2017-12-31 DIAGNOSIS — O0990 Supervision of high risk pregnancy, unspecified, unspecified trimester: Secondary | ICD-10-CM | POA: Insufficient documentation

## 2017-12-31 DIAGNOSIS — O24119 Pre-existing diabetes mellitus, type 2, in pregnancy, unspecified trimester: Secondary | ICD-10-CM | POA: Insufficient documentation

## 2017-12-31 DIAGNOSIS — Z3A09 9 weeks gestation of pregnancy: Secondary | ICD-10-CM | POA: Insufficient documentation

## 2017-12-31 DIAGNOSIS — O99351 Diseases of the nervous system complicating pregnancy, first trimester: Secondary | ICD-10-CM | POA: Insufficient documentation

## 2017-12-31 DIAGNOSIS — R112 Nausea with vomiting, unspecified: Secondary | ICD-10-CM | POA: Diagnosis present

## 2017-12-31 DIAGNOSIS — G4730 Sleep apnea, unspecified: Secondary | ICD-10-CM | POA: Diagnosis not present

## 2017-12-31 DIAGNOSIS — O10919 Unspecified pre-existing hypertension complicating pregnancy, unspecified trimester: Secondary | ICD-10-CM

## 2017-12-31 DIAGNOSIS — K219 Gastro-esophageal reflux disease without esophagitis: Secondary | ICD-10-CM | POA: Insufficient documentation

## 2017-12-31 DIAGNOSIS — O10911 Unspecified pre-existing hypertension complicating pregnancy, first trimester: Secondary | ICD-10-CM

## 2017-12-31 DIAGNOSIS — O24919 Unspecified diabetes mellitus in pregnancy, unspecified trimester: Secondary | ICD-10-CM

## 2017-12-31 DIAGNOSIS — O210 Mild hyperemesis gravidarum: Secondary | ICD-10-CM

## 2017-12-31 DIAGNOSIS — Z794 Long term (current) use of insulin: Secondary | ICD-10-CM

## 2017-12-31 DIAGNOSIS — Z3A01 Less than 8 weeks gestation of pregnancy: Secondary | ICD-10-CM | POA: Diagnosis not present

## 2017-12-31 DIAGNOSIS — O99611 Diseases of the digestive system complicating pregnancy, first trimester: Secondary | ICD-10-CM | POA: Insufficient documentation

## 2017-12-31 LAB — POCT URINALYSIS DIPSTICK OB
GLUCOSE, UA: NEGATIVE — AB
Ketones, UA: >160
Leukocytes, UA: NEGATIVE
Nitrite, UA: NEGATIVE
PH UA: 5.5 (ref 5.0–8.0)
Spec Grav, UA: 1.025 (ref 1.010–1.025)
UROBILINOGEN UA: 2 U/dL — AB

## 2017-12-31 LAB — COMPREHENSIVE METABOLIC PANEL
ALT: 17 U/L (ref 0–44)
AST: 22 U/L (ref 15–41)
Albumin: 3.4 g/dL — ABNORMAL LOW (ref 3.5–5.0)
Alkaline Phosphatase: 62 U/L (ref 38–126)
Anion gap: 14 (ref 5–15)
BUN: 9 mg/dL (ref 6–20)
CHLORIDE: 108 mmol/L (ref 98–111)
CO2: 16 mmol/L — AB (ref 22–32)
CREATININE: 0.87 mg/dL (ref 0.44–1.00)
Calcium: 9.2 mg/dL (ref 8.9–10.3)
GFR calc Af Amer: >60 mL/min (ref >60)
GFR calc non Af Amer: >60 mL/min (ref >60)
Glucose, Bld: 138 mg/dL — ABNORMAL HIGH (ref 70–99)
Potassium: 4.1 mmol/L (ref 3.5–5.1)
SODIUM: 138 mmol/L (ref 135–145)
Total Bilirubin: 0.8 mg/dL (ref 0.3–1.2)
Total Protein: 6.8 g/dL (ref 6.5–8.1)

## 2017-12-31 LAB — URINALYSIS, ROUTINE W REFLEX MICROSCOPIC
Bilirubin Urine: NEGATIVE
Glucose, UA: >=500 mg/dL — AB
Ketones, ur: 80 mg/dL — AB
Leukocytes, UA: NEGATIVE
Nitrite: NEGATIVE
PROTEIN: NEGATIVE mg/dL
Specific Gravity, Urine: 1.015 (ref 1.005–1.030)
pH: 5 (ref 5.0–8.0)

## 2017-12-31 MED ORDER — RANITIDINE HCL 150 MG PO TABS: 150.0000 mg | ORAL_TABLET | Freq: Two times a day (BID) | ORAL | 2 refills | Status: DC

## 2017-12-31 MED ORDER — PROMETHAZINE HCL 25 MG/ML IJ SOLN
25.0000 mg | Freq: Once | INTRAMUSCULAR | Status: AC
  Administered 2017-12-31: 25 mg via INTRAVENOUS
  Filled 2017-12-31: qty 1

## 2017-12-31 MED ORDER — SODIUM CHLORIDE 0.9 % IV SOLN
8.0000 mg | Freq: Once | INTRAVENOUS | Status: AC
  Administered 2017-12-31: 8 mg via INTRAVENOUS
  Filled 2017-12-31: qty 4

## 2017-12-31 MED ORDER — ONDANSETRON 8 MG PO TBDP: 8.0000 mg | ORAL_TABLET | Freq: Three times a day (TID) | ORAL | 0 refills | Status: DC | PRN

## 2017-12-31 MED ORDER — PROMETHAZINE HCL 25 MG PO TABS: 25.0000 mg | ORAL_TABLET | Freq: Every evening | ORAL | 0 refills | Status: DC | PRN

## 2017-12-31 MED ORDER — M.V.I. ADULT IV INJ
Freq: Once | INTRAVENOUS | Status: AC
  Administered 2017-12-31: 16:00:00 via INTRAVENOUS
  Filled 2017-12-31: qty 1000

## 2017-12-31 MED ORDER — FAMOTIDINE IN NACL 20-0.9 MG/50ML-% IV SOLN
20.0000 mg | Freq: Once | INTRAVENOUS | Status: AC
  Administered 2017-12-31: 20 mg via INTRAVENOUS
  Filled 2017-12-31: qty 50

## 2017-12-31 MED ORDER — DEXTROSE 5 % IN LACTATED RINGERS IV BOLUS
1000.0000 mL | Freq: Once | INTRAVENOUS | Status: AC
  Administered 2017-12-31: 1000 mL via INTRAVENOUS

## 2017-12-31 NOTE — Addendum Note (Signed)
Addended by: Clovis PuMARTIN, TAMIKA L on: 12/31/2017 04:45 PM   Modules accepted: Orders

## 2017-12-31 NOTE — MAU Provider Note (Signed)
History     CSN: [id number]  Arrival date and time: 12/31/17 1235   First Provider Initiated Contact with Patient 12/31/17 1354      No chief complaint on file.  HPI   Ms.Kathryn Shelton is a 27 y.o. female G3P2002 @[redacted]w[redacted]d  here in MAU with complaints with N/V. Says she has not tried anything medication at home for N/V. Says she cannot keep anything down. She cannot keep fluids or food down at this point. She was in the office today and was actively vomiting. No bleeding or pain.   OB History    Gravida  3   Para  2   Term  2   Preterm  0   AB  0   Living  2     SAB  0   TAB  0   Ectopic  0   Multiple  0   Live Births  2           Past Medical History:  Diagnosis Date  . Diabetes mellitus without complication ([place])   . GERD (gastroesophageal reflux disease)   . Hypertension   . Sleep apnea     History reviewed. No pertinent surgical history.  Family History  Adopted: Yes  Family history unknown: Yes    Social History   Tobacco Use  . Smoking status: Never Smoker  . Smokeless tobacco: Never Used  Substance Use Topics  . Alcohol use: No  . Drug use: No    Allergies:  Allergies  Allergen Reactions  . Shellfish-Derived Products Swelling    Medications Prior to Admission  Medication Sig Dispense Refill Last Dose  . Insulin Glargine (LANTUS) 100 UNIT/ML Solostar Pen Inject 10 Units into the skin daily at 10 pm. (Patient taking differently: Inject 14 Units into the skin daily at 10 pm. ) 15 mL 11 Past Week at Unknown time  . metFORMIN (GLUCOPHAGE) 500 MG tablet Take 1 tablet (500 mg total) by mouth 2 (two) times daily with a meal. 60 tablet 0 12/30/2017 at Unknown time  . Blood Glucose Monitoring Suppl (ACCU-CHEK AVIVA PLUS) w/Device KIT Check blood sugars fasting and at bedtime 1 kit 0 Taking  . Doxylamine-Pyridoxine 10-10 MG TBEC Take 2 tablets by mouth at bedtime. (Patient not taking: Reported on 12/31/2017) 60 tablet 0 Not Taking at Unknown time   . glucose blood (ACCU-CHEK AVIVA) test strip Use as instructed 100 each 12 Taking  . ibuprofen (ADVIL,MOTRIN) 600 MG tablet Take 1 tablet (600 mg total) by mouth every 6 (six) hours as needed. (Patient not taking: Reported on 12/22/2017) 30 tablet 0 Not Taking  . Lancet Devices (ACCU-CHEK SOFTCLIX) lancets Use as instructed 100 each 1 Taking  . metoCLOPramide (REGLAN) 10 MG tablet Take 1 tablet (10 mg total) by mouth every 6 (six) hours. (Patient not taking: Reported on 12/22/2017) 120 tablet 1 Not Taking  . Prenatal MV & Min w/FA-DHA (PRENATAL ADULT GUMMY/DHA/FA) 0.4-25 MG CHEW Chew 1 tablet by mouth daily. (Patient not taking: Reported on 12/31/2017) 30 tablet 12 Not Taking at Unknown time  . promethazine (PHENERGAN) 25 MG tablet Take 1 tablet (25 mg total) by mouth every 6 (six) hours as needed for nausea or vomiting. (Patient not taking: Reported on 12/22/2017) 12 tablet 0 Not Taking  . ranitidine (ZANTAC) 150 MG tablet Take 1 tablet (150 mg total) by mouth 2 (two) times daily. (Patient not taking: Reported on 12/31/2017) 60 tablet 0 Not Taking at Unknown time   Results for orders placed  or performed during the hospital encounter of 12/31/17 (from the past 72 hour(s))  Comprehensive metabolic panel     Status: Abnormal   Collection Time: 12/31/17  2:00 PM  Result Value Ref Range   Sodium 138 135 - 145 mmol/L   Potassium 4.1 3.5 - 5.1 mmol/L   Chloride 108 98 - 111 mmol/L   CO2 16 (L) 22 - 32 mmol/L   Glucose, Bld 138 (H) 70 - 99 mg/dL   BUN 9 6 - 20 mg/dL   Creatinine, Ser 0.87 0.44 - 1.00 mg/dL   Calcium 9.2 8.9 - 10.3 mg/dL   Total Protein 6.8 6.5 - 8.1 g/dL   Albumin 3.4 (L) 3.5 - 5.0 g/dL   AST 22 15 - 41 U/L   ALT 17 0 - 44 U/L   Alkaline Phosphatase 62 38 - 126 U/L   Total Bilirubin 0.8 0.3 - 1.2 mg/dL   GFR calc non Af Amer >60 >60 mL/min   GFR calc Af Amer >60 >60 mL/min    Comment: (NOTE) The eGFR has been calculated using the CKD EPI equation. This calculation has not been  validated in all clinical situations. eGFR's persistently <60 mL/min signify possible Chronic Kidney Disease.    Anion gap 14 5 - 15    Comment: Performed at [facility], [address], [place], [place] [address]  Urinalysis, Routine w reflex microscopic     Status: Abnormal   Collection Time: 12/31/17  4:20 PM  Result Value Ref Range   Color, Urine YELLOW YELLOW   APPearance CLEAR CLEAR   Specific Gravity, Urine 1.015 1.005 - 1.030   pH 5.0 5.0 - 8.0   Glucose, UA >=500 (A) NEGATIVE mg/dL   Hgb urine dipstick SMALL (A) NEGATIVE   Bilirubin Urine NEGATIVE NEGATIVE   Ketones, ur 80 (A) NEGATIVE mg/dL   Protein, ur NEGATIVE NEGATIVE mg/dL   Nitrite NEGATIVE NEGATIVE   Leukocytes, UA NEGATIVE NEGATIVE   RBC / HPF 0-5 0 - 5 RBC/hpf   WBC, UA 0-5 0 - 5 WBC/hpf   Bacteria, UA RARE (A) NONE SEEN   Squamous Epithelial / LPF 0-5 0 - 5   Mucus PRESENT     Comment: Performed at [facility], [address], [place], [place] [address]   Review of Systems  Constitutional: Negative for fever.  Gastrointestinal: Positive for abdominal pain, nausea and vomiting.  Genitourinary: Negative for vaginal bleeding and vaginal discharge.   Physical Exam   Blood pressure 119/72, pulse 73, temperature 98.1 F (36.7 C), temperature source Oral, resp. rate 18, weight 94.8 kg, last menstrual period 10/07/2017.  Physical Exam  Constitutional: She is oriented to person, place, and time. She appears well-developed and well-nourished. No distress.  Musculoskeletal: Normal range of motion.  Neurological: She is alert and oriented to person, place, and time.  Skin: Skin is warm. She is not diaphoretic.  Psychiatric: Her behavior is normal.   MAU Course  Procedures  None  MDM  Lr Bolus MVI X1 Phenergan, Zofran, Pepcid. Patient tolerating fluids and crackers at this time.   Assessment and Plan   A:  1. Hyperemesis affecting pregnancy, antepartum   2. [redacted] weeks gestation of pregnancy      P:  Discharge home in stable condition Rx: Zofran, Zantac, phenergan Return to MAU if symptoms worsen  Small, frequent meals Follow up with OB  Lezlie Lye, NP 01/02/2018 8:59 PM

## 2017-12-31 NOTE — MAU Note (Signed)
Pt states she feels like she is going to pass out, unable to go to the BR at this time.  To room 4 by WC.

## 2017-12-31 NOTE — MAU Note (Signed)
Pt sent from MD office for vomiting.  Pt states she has been vomiting ever since she found out she was pregnant.  Pt denies pain or bleeding.

## 2017-12-31 NOTE — Progress Notes (Signed)
Subjective:    Kathryn Shelton is being seen today for her first obstetrical visit.  This is not a planned pregnancy. She is at 5580w4d gestation. Her obstetrical history is significant for non-compliance, obesity and cHTN & IDDM. Relationship with FOB -- FOB not involved (FOB was a one night stand and she does not know his name): significant other, living together ([place] -- NOT FOB) Patient does intend to breast feed. Pregnancy history fully reviewed.  Patient reports fatigue, nausea and vomiting. "Unable to keep anything down. Feeling like I'm about to pass out."  Review of Systems:   Review of Systems  Constitutional: Positive for appetite change and fatigue.  HENT: Negative.   Eyes: Negative.   Respiratory: Negative.   Cardiovascular: Negative.   Gastrointestinal: Positive for nausea and vomiting.  Endocrine: Negative.   Genitourinary:       "some urine leaking"  Musculoskeletal: Negative.   Skin: Negative.   Allergic/Immunologic: Negative.   Neurological: Negative.   Hematological: Negative.   Psychiatric/Behavioral: Negative.     Objective:     BP 107/80   Pulse 72   Temp 98.4 F (36.9 C)   Wt 204 lb (92.5 kg)   LMP 10/07/2017 (Approximate)   BMI 33.95 kg/m    Physical Exam  Nursing note and vitals reviewed. Constitutional: She is oriented to person, place, and time. She appears well-developed and well-nourished. She appears lethargic.  HENT:  Head: Normocephalic and atraumatic.  Right Ear: External ear normal.  Left Ear: External ear normal.  Nose: Nose normal.  Mouth/Throat: Oropharynx is clear and moist.  Eyes: Pupils are equal, round, and reactive to light. Conjunctivae and EOM are normal.  Neck: Normal range of motion. Neck supple.  Cardiovascular: Normal rate, regular rhythm, normal heart sounds and intact distal pulses.  Respiratory: Effort normal and breath sounds normal.  GI: Soft. Bowel sounds are normal.  Genitourinary: Uterus normal. Vaginal  discharge found.  Genitourinary Comments: Uterus: enlarged, S=D, SE: cervix is smooth, pink, no lesions, moderate amt of thick, yellowish-green vaginal d/c -- Pap with co-testing, WP, GC/CT done, closed/long/firm, no CMT, (+) friability, no adnexal tenderness // strong urine smell  Musculoskeletal: Normal range of motion.  Neurological: She is oriented to person, place, and time. She has normal reflexes. She appears lethargic.  Skin: Skin is warm and dry.  Psychiatric: She has a normal mood and affect. Her behavior is normal. Judgment and thought content normal.    Maternal Exam:  Introitus: Vagina is positive for vaginal discharge.     Pt informed that the ultrasound is considered a limited OB ultrasound and is not intended to be a complete ultrasound exam.  Patient also informed that the ultrasound is not being completed with the intent of assessing for fetal or placental anomalies or any pelvic abnormalities.  Explained that the purpose of today's ultrasound is to assess for  viability. Fetus appears to be 9 wks. S=D.  Patient acknowledges the purpose of the exam and the limitations of the study.    Assessment:    Pregnancy: [id number] Patient Active Problem List   Diagnosis Date Noted  . Supervision of high risk pregnancy, antepartum 12/31/2017  . Chronic hypertension during pregnancy, antepartum 12/31/2017  . Insulin-dependent diabetes mellitus during pregnancy, antepartum ([facility]) 12/31/2017  Hyperemesis Gravidarium     Plan:    Initial labs not done -- phlebotomy unable to draw blood d/t severe dehydration. Prenatal vitamins. Advised to p/u Rx for Diclegis and take as directed. Continue taking Reglan to  decrease acid reflux. Problem list reviewed and updated. AFP3 discussed: ordered. Role of ultrasound in pregnancy discussed; fetal survey: ordered. Amniocentesis discussed: not indicated at this time. Schedule < 14 wks U/S. Follow up in 4 weeks - transfer to [facility] at [facility] for HROB  care. To MAU for IVFs and prenatal labs to be drawn. 100% of 40 min visit spent on counseling and coordination of care.    Raelyn Mora, MSN, CNM 12/31/2017

## 2017-12-31 NOTE — Discharge Instructions (Signed)

## 2018-01-01 LAB — SMN1 COPY NUMBER ANALYSIS (SMA CARRIER SCREENING)

## 2018-01-01 LAB — CYTOLOGY - PAP
Bacterial vaginitis: POSITIVE — AB
CANDIDA VAGINITIS: NEGATIVE
Chlamydia: NEGATIVE
Diagnosis: NEGATIVE
Neisseria Gonorrhea: NEGATIVE
Trichomonas: NEGATIVE

## 2018-01-02 ENCOUNTER — Other Ambulatory Visit: Payer: Self-pay | Admitting: Obstetrics and Gynecology

## 2018-01-02 DIAGNOSIS — N760 Acute vaginitis: Principal | ICD-10-CM

## 2018-01-02 DIAGNOSIS — B9689 Other specified bacterial agents as the cause of diseases classified elsewhere: Secondary | ICD-10-CM | POA: Insufficient documentation

## 2018-01-02 LAB — PROTEIN / CREATININE RATIO, URINE
CREATININE, UR: 290.1 mg/dL
PROTEIN UR: 67.1 mg/dL
Protein/Creat Ratio: 231 mg/g creat — ABNORMAL HIGH (ref 0–200)

## 2018-01-02 MED ORDER — METRONIDAZOLE 0.75 % VA GEL: 1.0000 | Freq: Two times a day (BID) | VAGINAL | 0 refills | Status: AC

## 2018-01-02 NOTE — Progress Notes (Signed)
TC to patient to notify of BV on Pap and tx with Metrogel 0.75% pv hs x 5 days. Patient reports feeling better than she did on Thursday 12/31/17. Patient verbalized an understanding of the plan of care and agrees.   Kathryn MoraRolitta Idalia Shelton, CNM  01/02/18 5:39 PM

## 2018-01-03 LAB — CULTURE, OB URINE

## 2018-01-03 LAB — URINE CULTURE, OB REFLEX

## 2018-01-05 ENCOUNTER — Ambulatory Visit (HOSPITAL_COMMUNITY)
Admission: RE | Admit: 2018-01-05 | Discharge: 2018-01-05 | Disposition: A | Discharge status: Home / Self Care | Payer: Medicaid Other | Source: Ambulatory Visit | Attending: Obstetrics and Gynecology | Admitting: Obstetrics and Gynecology

## 2018-01-05 DIAGNOSIS — O4693 Antepartum hemorrhage, unspecified, third trimester: Secondary | ICD-10-CM | POA: Diagnosis not present

## 2018-01-05 DIAGNOSIS — O0990 Supervision of high risk pregnancy, unspecified, unspecified trimester: Secondary | ICD-10-CM | POA: Diagnosis present

## 2018-01-05 DIAGNOSIS — Z794 Long term (current) use of insulin: Secondary | ICD-10-CM | POA: Insufficient documentation

## 2018-01-05 DIAGNOSIS — O0991 Supervision of high risk pregnancy, unspecified, first trimester: Secondary | ICD-10-CM | POA: Insufficient documentation

## 2018-01-05 DIAGNOSIS — O10919 Unspecified pre-existing hypertension complicating pregnancy, unspecified trimester: Secondary | ICD-10-CM

## 2018-01-05 DIAGNOSIS — O10913 Unspecified pre-existing hypertension complicating pregnancy, third trimester: Secondary | ICD-10-CM | POA: Diagnosis not present

## 2018-01-05 DIAGNOSIS — IMO0001 Reserved for inherently not codable concepts without codable children: Secondary | ICD-10-CM

## 2018-01-05 DIAGNOSIS — O24919 Unspecified diabetes mellitus in pregnancy, unspecified trimester: Secondary | ICD-10-CM

## 2018-01-05 DIAGNOSIS — O24913 Unspecified diabetes mellitus in pregnancy, third trimester: Secondary | ICD-10-CM | POA: Insufficient documentation

## 2018-01-13 ENCOUNTER — Ambulatory Visit (HOSPITAL_BASED_OUTPATIENT_CLINIC_OR_DEPARTMENT_OTHER): Payer: Medicaid Other | Admitting: Internal Medicine

## 2018-01-13 DIAGNOSIS — G4730 Sleep apnea, unspecified: Secondary | ICD-10-CM

## 2018-01-13 DIAGNOSIS — G4733 Obstructive sleep apnea (adult) (pediatric): Secondary | ICD-10-CM

## 2018-01-13 DIAGNOSIS — G4710 Hypersomnia, unspecified: Secondary | ICD-10-CM

## 2018-01-13 DIAGNOSIS — R0683 Snoring: Secondary | ICD-10-CM

## 2018-01-14 ENCOUNTER — Other Ambulatory Visit: Payer: Self-pay

## 2018-01-14 ENCOUNTER — Inpatient Hospital Stay (HOSPITAL_COMMUNITY)
Admission: AD | Admit: 2018-01-14 | Discharge: 2018-01-16 | DRG: 832 | Disposition: A | Discharge status: Home / Self Care | Payer: Medicaid Other | Attending: Obstetrics and Gynecology | Admitting: Obstetrics and Gynecology

## 2018-01-14 ENCOUNTER — Encounter (HOSPITAL_COMMUNITY): Payer: Self-pay | Admitting: *Deleted

## 2018-01-14 DIAGNOSIS — IMO0001 Reserved for inherently not codable concepts without codable children: Secondary | ICD-10-CM

## 2018-01-14 DIAGNOSIS — R1110 Vomiting, unspecified: Secondary | ICD-10-CM

## 2018-01-14 DIAGNOSIS — O10011 Pre-existing essential hypertension complicating pregnancy, first trimester: Secondary | ICD-10-CM | POA: Diagnosis present

## 2018-01-14 DIAGNOSIS — Z3A11 11 weeks gestation of pregnancy: Secondary | ICD-10-CM

## 2018-01-14 DIAGNOSIS — O24111 Pre-existing diabetes mellitus, type 2, in pregnancy, first trimester: Secondary | ICD-10-CM | POA: Diagnosis present

## 2018-01-14 DIAGNOSIS — E119 Type 2 diabetes mellitus without complications: Secondary | ICD-10-CM | POA: Diagnosis present

## 2018-01-14 DIAGNOSIS — Z794 Long term (current) use of insulin: Secondary | ICD-10-CM | POA: Diagnosis not present

## 2018-01-14 DIAGNOSIS — O219 Vomiting of pregnancy, unspecified: Secondary | ICD-10-CM | POA: Diagnosis present

## 2018-01-14 DIAGNOSIS — O210 Mild hyperemesis gravidarum: Principal | ICD-10-CM | POA: Diagnosis present

## 2018-01-14 DIAGNOSIS — O10919 Unspecified pre-existing hypertension complicating pregnancy, unspecified trimester: Secondary | ICD-10-CM

## 2018-01-14 DIAGNOSIS — O24919 Unspecified diabetes mellitus in pregnancy, unspecified trimester: Secondary | ICD-10-CM

## 2018-01-14 HISTORY — DX: Type 2 diabetes mellitus without complications: E11.9

## 2018-01-14 HISTORY — DX: Vomiting of pregnancy, unspecified: O21.9

## 2018-01-14 LAB — COMPREHENSIVE METABOLIC PANEL
ALBUMIN: 3.7 g/dL (ref 3.5–5.0)
ALT: 18 U/L (ref 0–44)
ANION GAP: 16 — AB (ref 5–15)
AST: 19 U/L (ref 15–41)
Alkaline Phosphatase: 73 U/L (ref 38–126)
BUN: 10 mg/dL (ref 6–20)
CO2: 16 mmol/L — ABNORMAL LOW (ref 22–32)
Calcium: 9.3 mg/dL (ref 8.9–10.3)
Chloride: 106 mmol/L (ref 98–111)
Creatinine, Ser: 0.99 mg/dL (ref 0.44–1.00)
GFR calc Af Amer: >60 mL/min (ref >60)
GFR calc non Af Amer: >60 mL/min (ref >60)
GLUCOSE: 131 mg/dL — AB (ref 70–99)
POTASSIUM: 3.7 mmol/L (ref 3.5–5.1)
SODIUM: 138 mmol/L (ref 135–145)
TOTAL PROTEIN: 8 g/dL (ref 6.5–8.1)
Total Bilirubin: 1.4 mg/dL — ABNORMAL HIGH (ref 0.3–1.2)

## 2018-01-14 LAB — URINALYSIS, ROUTINE W REFLEX MICROSCOPIC
BILIRUBIN URINE: NEGATIVE
Bacteria, UA: NONE SEEN
GLUCOSE, UA: NEGATIVE mg/dL
Ketones, ur: 80 mg/dL — AB
Leukocytes, UA: NEGATIVE
Nitrite: NEGATIVE
PROTEIN: 100 mg/dL — AB
Specific Gravity, Urine: 1.015 (ref 1.005–1.030)
pH: 5 (ref 5.0–8.0)

## 2018-01-14 LAB — HEMOGLOBIN A1C
HEMOGLOBIN A1C: 8.2 % — AB (ref 4.8–5.6)
Mean Plasma Glucose: 188.64 mg/dL

## 2018-01-14 LAB — CBC
HCT: 39.5 % (ref 36.0–46.0)
Hemoglobin: 13.4 g/dL (ref 12.0–15.0)
MCH: 27.5 pg (ref 26.0–34.0)
MCHC: 33.9 g/dL (ref 30.0–36.0)
MCV: 80.9 fL (ref 78.0–100.0)
Platelets: 274 10*3/uL (ref 150–400)
RBC: 4.88 MIL/uL (ref 3.87–5.11)
RDW: 13.2 % (ref 11.5–15.5)
WBC: 5.5 10*3/uL (ref 4.0–10.5)

## 2018-01-14 LAB — RAPID URINE DRUG SCREEN, HOSP PERFORMED
AMPHETAMINES: NOT DETECTED
BENZODIAZEPINES: NOT DETECTED
Barbiturates: NOT DETECTED
Cocaine: NOT DETECTED
Opiates: NOT DETECTED
Tetrahydrocannabinol: NOT DETECTED

## 2018-01-14 LAB — GLUCOSE, CAPILLARY
GLUCOSE-CAPILLARY: 85 mg/dL (ref 70–99)
Glucose-Capillary: 114 mg/dL — ABNORMAL HIGH (ref 70–99)
Glucose-Capillary: 80 mg/dL (ref 70–99)

## 2018-01-14 LAB — MAGNESIUM: Magnesium: 1.7 mg/dL (ref 1.7–2.4)

## 2018-01-14 LAB — TSH: TSH: 0.605 u[IU]/mL (ref 0.350–4.500)

## 2018-01-14 MED ORDER — ACETAMINOPHEN 325 MG PO TABS: 650.0000 mg | ORAL_TABLET | ORAL | Status: DC | PRN

## 2018-01-14 MED ORDER — INSULIN ASPART 100 UNIT/ML ~~LOC~~ SOLN
0.0000 [IU] | Freq: Four times a day (QID) | SUBCUTANEOUS | Status: DC
  Administered 2018-01-15: 3 [IU] via SUBCUTANEOUS

## 2018-01-14 MED ORDER — DOCUSATE SODIUM 100 MG PO CAPS
100.0000 mg | ORAL_CAPSULE | Freq: Two times a day (BID) | ORAL | Status: DC | PRN
  Filled 2018-01-14: qty 1

## 2018-01-14 MED ORDER — SODIUM CHLORIDE 0.9 % IV SOLN
8.0000 mg | Freq: Four times a day (QID) | INTRAVENOUS | Status: DC
  Administered 2018-01-14 – 2018-01-15 (×4): 8 mg via INTRAVENOUS
  Filled 2018-01-14 (×6): qty 4

## 2018-01-14 MED ORDER — FAMOTIDINE IN NACL 20-0.9 MG/50ML-% IV SOLN
20.0000 mg | Freq: Once | INTRAVENOUS | Status: AC
  Administered 2018-01-14: 20 mg via INTRAVENOUS
  Filled 2018-01-14: qty 50

## 2018-01-14 MED ORDER — SCOPOLAMINE 1 MG/3DAYS TD PT72
1.0000 | MEDICATED_PATCH | TRANSDERMAL | Status: DC
  Administered 2018-01-14: 1.5 mg via TRANSDERMAL
  Filled 2018-01-14: qty 1

## 2018-01-14 MED ORDER — THIAMINE HCL 100 MG/ML IJ SOLN
Freq: Once | INTRAVENOUS | Status: AC
  Administered 2018-01-14: 14:00:00 via INTRAVENOUS
  Filled 2018-01-14: qty 1000

## 2018-01-14 MED ORDER — M.V.I. ADULT IV INJ
Freq: Once | INTRAVENOUS | Status: DC
  Administered 2018-01-14: 12:00:00 via INTRAVENOUS
  Filled 2018-01-14: qty 10

## 2018-01-14 MED ORDER — LACTATED RINGERS IV BOLUS
1000.0000 mL | Freq: Once | INTRAVENOUS | Status: AC
  Administered 2018-01-14: 1000 mL via INTRAVENOUS

## 2018-01-14 MED ORDER — CALCIUM CARBONATE ANTACID 500 MG PO CHEW: 2.0000 | CHEWABLE_TABLET | ORAL | Status: DC | PRN

## 2018-01-14 MED ORDER — PYRIDOXINE HCL 25 MG PO TABS
25.0000 mg | ORAL_TABLET | Freq: Two times a day (BID) | ORAL | Status: DC
  Administered 2018-01-14 – 2018-01-16 (×4): 25 mg via ORAL
  Filled 2018-01-14 (×5): qty 1

## 2018-01-14 MED ORDER — ENOXAPARIN SODIUM 40 MG/0.4ML ~~LOC~~ SOLN
40.0000 mg | SUBCUTANEOUS | Status: DC
  Administered 2018-01-14 – 2018-01-15 (×2): 40 mg via SUBCUTANEOUS
  Filled 2018-01-14 (×3): qty 0.4

## 2018-01-14 MED ORDER — LACTATED RINGERS IV SOLN
INTRAVENOUS | Status: DC
  Administered 2018-01-14 – 2018-01-15 (×2): via INTRAVENOUS

## 2018-01-14 MED ORDER — METOCLOPRAMIDE HCL 5 MG/ML IJ SOLN
10.0000 mg | Freq: Four times a day (QID) | INTRAMUSCULAR | Status: DC
  Administered 2018-01-14 – 2018-01-15 (×6): 10 mg via INTRAVENOUS
  Filled 2018-01-14 (×6): qty 2

## 2018-01-14 MED ORDER — DIPHENHYDRAMINE HCL 25 MG PO CAPS
25.0000 mg | ORAL_CAPSULE | Freq: Three times a day (TID) | ORAL | Status: DC
  Administered 2018-01-14 – 2018-01-16 (×5): 25 mg via ORAL
  Filled 2018-01-14 (×5): qty 1

## 2018-01-14 MED ORDER — FAMOTIDINE IN NACL 20-0.9 MG/50ML-% IV SOLN
20.0000 mg | Freq: Two times a day (BID) | INTRAVENOUS | Status: DC
  Administered 2018-01-14 – 2018-01-15 (×2): 20 mg via INTRAVENOUS
  Filled 2018-01-14 (×3): qty 50

## 2018-01-14 MED ORDER — PROMETHAZINE HCL 25 MG/ML IJ SOLN
25.0000 mg | Freq: Once | INTRAMUSCULAR | Status: AC
  Administered 2018-01-14: 25 mg via INTRAVENOUS
  Filled 2018-01-14: qty 1

## 2018-01-14 NOTE — MAU Provider Note (Signed)
History     CSN: [id number]  Arrival date and time: 01/14/18 9024   First Provider Initiated Contact with Patient 01/14/18 1019      Chief Complaint  Patient presents with  . Emesis  . Fatigue   HPI Kathryn Shelton is a 27 y.o. G3P2002 at 23w4dwho presents with nausea and vomiting. This is an ongoing issue in this pregnancy. She states she has not eaten any food in the last 3 weeks. She is able to keep down sips of liquids until yesterday. She states she throws up multiple times every hour. She is not able to keep her antiemetic medications down due to the vomiting. She denies any vaginal bleeding, discharge or pain.  This pregnancy is complicated by insulin dependent diabetes and chronic hypertension.   OB History    Gravida  3   Para  2   Term  2   Preterm  0   AB  0   Living  2     SAB  0   TAB  0   Ectopic  0   Multiple  0   Live Births  2           Past Medical History:  Diagnosis Date  . Diabetes mellitus without complication ([place]   . GERD (gastroesophageal reflux disease)   . Hypertension   . Sleep apnea     History reviewed. No pertinent surgical history.  Family History  Adopted: Yes  Family history unknown: Yes    Social History   Tobacco Use  . Smoking status: Never Smoker  . Smokeless tobacco: Never Used  Substance Use Topics  . Alcohol use: No  . Drug use: No    Allergies:  Allergies  Allergen Reactions  . Shellfish-Derived Products Swelling    Medications Prior to Admission  Medication Sig Dispense Refill Last Dose  . Blood Glucose Monitoring Suppl (ACCU-CHEK AVIVA PLUS) w/Device KIT Check blood sugars fasting and at bedtime 1 kit 0 Taking  . Doxylamine-Pyridoxine 10-10 MG TBEC Take 2 tablets by mouth at bedtime. (Patient not taking: Reported on 12/31/2017) 60 tablet 0 Not Taking at Unknown time  . glucose blood (ACCU-CHEK AVIVA) test strip Use as instructed 100 each 12 Taking  . Insulin Glargine (LANTUS) 100 UNIT/ML  Solostar Pen Inject 10 Units into the skin daily at 10 pm. (Patient taking differently: Inject 14 Units into the skin daily at 10 pm. ) 15 mL 11 Past Week at Unknown time  . Lancet Devices (ACCU-CHEK SOFTCLIX) lancets Use as instructed 100 each 1 Taking  . metFORMIN (GLUCOPHAGE) 500 MG tablet Take 1 tablet (500 mg total) by mouth 2 (two) times daily with a meal. 60 tablet 0 12/30/2017 at Unknown time  . metoCLOPramide (REGLAN) 10 MG tablet Take 1 tablet (10 mg total) by mouth every 6 (six) hours. (Patient not taking: Reported on 12/22/2017) 120 tablet 1 Not Taking  . ondansetron (ZOFRAN ODT) 8 MG disintegrating tablet Take 1 tablet (8 mg total) by mouth every 8 (eight) hours as needed for nausea or vomiting. 20 tablet 0   . Prenatal MV & Min w/FA-DHA (PRENATAL ADULT GUMMY/DHA/FA) 0.4-25 MG CHEW Chew 1 tablet by mouth daily. (Patient not taking: Reported on 12/31/2017) 30 tablet 12 Not Taking at Unknown time  . promethazine (PHENERGAN) 25 MG tablet Take 1 tablet (25 mg total) by mouth every 6 (six) hours as needed for nausea or vomiting. (Patient not taking: Reported on 12/22/2017) 12 tablet 0 Not Taking  .  promethazine (PHENERGAN) 25 MG tablet Take 1 tablet (25 mg total) by mouth at bedtime as needed for nausea or vomiting. 30 tablet 0   . ranitidine (ZANTAC) 150 MG tablet Take 1 tablet (150 mg total) by mouth 2 (two) times daily. (Patient not taking: Reported on 12/31/2017) 60 tablet 0 Not Taking at Unknown time  . ranitidine (ZANTAC) 150 MG tablet Take 1 tablet (150 mg total) by mouth 2 (two) times daily. 60 tablet 2     Review of Systems  Constitutional: Negative.  Negative for fatigue and fever.  HENT: Negative.   Respiratory: Negative.  Negative for shortness of breath.   Cardiovascular: Negative.  Negative for chest pain.  Gastrointestinal: Positive for nausea and vomiting. Negative for abdominal pain, constipation and diarrhea.  Genitourinary: Negative.  Negative for dysuria, vaginal bleeding and  vaginal discharge.  Neurological: Negative.  Negative for dizziness and headaches.   Physical Exam   Blood pressure 113/84, pulse (!) 111, temperature 98.5 F (36.9 C), temperature source Oral, resp. rate 20, weight 90.4 kg, last menstrual period 10/07/2017, SpO2 100 %.  Physical Exam  Nursing note and vitals reviewed. Constitutional: She is oriented to person, place, and time. She appears well-developed and well-nourished. No distress.  HENT:  Head: Normocephalic.  Eyes: Pupils are equal, round, and reactive to light.  Cardiovascular: Normal rate, regular rhythm and normal heart sounds.  Respiratory: Effort normal and breath sounds normal. No respiratory distress.  GI: Soft. Bowel sounds are normal. She exhibits no distension. There is no tenderness.  Neurological: She is alert and oriented to person, place, and time.  Skin: Skin is warm and dry.  Psychiatric: She has a normal mood and affect. Her behavior is normal. Judgment and thought content normal.   FHR: 158 bpm  MAU Course  Procedures Results for orders placed or performed during the hospital encounter of 01/14/18 (from the past 24 hour(s))  Urinalysis, Routine w reflex microscopic     Status: Abnormal   Collection Time: 01/14/18 10:20 AM  Result Value Ref Range   Color, Urine YELLOW YELLOW   APPearance HAZY (A) CLEAR   Specific Gravity, Urine 1.015 1.005 - 1.030   pH 5.0 5.0 - 8.0   Glucose, UA NEGATIVE NEGATIVE mg/dL   Hgb urine dipstick SMALL (A) NEGATIVE   Bilirubin Urine NEGATIVE NEGATIVE   Ketones, ur 80 (A) NEGATIVE mg/dL   Protein, ur 100 (A) NEGATIVE mg/dL   Nitrite NEGATIVE NEGATIVE   Leukocytes, UA NEGATIVE NEGATIVE   RBC / HPF 0-5 0 - 5 RBC/hpf   WBC, UA 0-5 0 - 5 WBC/hpf   Bacteria, UA NONE SEEN NONE SEEN   Squamous Epithelial / LPF 0-5 0 - 5   Mucus PRESENT   CBC     Status: None   Collection Time: 01/14/18 10:58 AM  Result Value Ref Range   WBC 5.5 4.0 - 10.5 K/uL   RBC 4.88 3.87 - 5.11 MIL/uL    Hemoglobin 13.4 12.0 - 15.0 g/dL   HCT 39.5 36.0 - 46.0 %   MCV 80.9 78.0 - 100.0 fL   MCH 27.5 26.0 - 34.0 pg   MCHC 33.9 30.0 - 36.0 g/dL   RDW 13.2 11.5 - 15.5 %   Platelets 274 150 - 400 K/uL  Comprehensive metabolic panel     Status: Abnormal   Collection Time: 01/14/18 10:58 AM  Result Value Ref Range   Sodium 138 135 - 145 mmol/L   Potassium 3.7 3.5 - 5.1 mmol/L  Chloride 106 98 - 111 mmol/L   CO2 16 (L) 22 - 32 mmol/L   Glucose, Bld 131 (H) 70 - 99 mg/dL   BUN 10 6 - 20 mg/dL   Creatinine, Ser 0.99 0.44 - 1.00 mg/dL   Calcium 9.3 8.9 - 10.3 mg/dL   Total Protein 8.0 6.5 - 8.1 g/dL   Albumin 3.7 3.5 - 5.0 g/dL   AST 19 15 - 41 U/L   ALT 18 0 - 44 U/L   Alkaline Phosphatase 73 38 - 126 U/L   Total Bilirubin 1.4 (H) 0.3 - 1.2 mg/dL   GFR calc non Af Amer >60 >60 mL/min   GFR calc Af Amer >60 >60 mL/min   Anion gap 16 (H) 5 - 15   MDM UA CBC, CMP IV LR bolus Multivitamin in LR Phenergan IV Pepcid IV  Patient has lost 26 pounds since ED visit on 7/30.  Consulted with Dr. Ilda Basset regarding labs and weight loss- will admit to [facility] High [facility] for ongoing management of vomiting and diabetes  Assessment and Plan   1. Hyperemesis   2. Chronic hypertension during pregnancy, antepartum   3. Insulin-dependent diabetes mellitus during pregnancy, antepartum ([place])   4. [redacted] weeks gestation of pregnancy    -Admit to OB High Risk  -Continue IV fluids  Wende Mott CNM 01/14/2018, 10:20 AM

## 2018-01-14 NOTE — H&P (Signed)
History     CSN: [id number]  Arrival date and time: 01/14/18 2202   First Provider Initiated Contact with Patient 01/14/18 1019      Chief Complaint  Patient presents with  . Emesis  . Fatigue   HPI Kathryn Shelton is a 27 y.o. G3P2002 at 81w4dwho presents with nausea and vomiting. This is an ongoing issue in this pregnancy. She states she has not eaten any food in the last 3 weeks. She is able to keep down sips of liquids until yesterday. She states she throws up multiple times every hour. She is not able to keep her antiemetic medications down due to the vomiting. She denies any vaginal bleeding, discharge or pain.  This pregnancy is complicated by insulin dependent diabetes and chronic hypertension.   OB History    Gravida  3   Para  2   Term  2   Preterm  0   AB  0   Living  2     SAB  0   TAB  0   Ectopic  0   Multiple  0   Live Births  2           Past Medical History:  Diagnosis Date  . [place] complication ([place]   . GERD (gastroesophageal reflux disease)   . Hypertension   . Sleep apnea     History reviewed. No pertinent surgical history.  Family History  Adopted: Yes  Family history unknown: Yes    Social History   Tobacco Use  . Smoking status: Never Smoker  . Smokeless tobacco: Never Used  Substance Use Topics  . Alcohol use: No  . Drug use: No    Allergies:  Allergies  Allergen Reactions  . Shellfish-Derived Products Swelling    Medications Prior to Admission  Medication Sig Dispense Refill Last Dose  . Blood Glucose Monitoring Suppl (ACCU-CHEK AVIVA PLUS) w/Device KIT Check blood sugars fasting and at bedtime 1 kit 0 Taking  . Doxylamine-Pyridoxine 10-10 MG TBEC Take 2 tablets by mouth at bedtime. (Patient not taking: Reported on 12/31/2017) 60 tablet 0 Not Taking at Unknown time  . glucose blood (ACCU-CHEK AVIVA) test strip Use as instructed 100 each 12 Taking  . Insulin Glargine (LANTUS) 100 UNIT/ML  Solostar Pen Inject 10 Units into the skin daily at 10 pm. (Patient taking differently: Inject 14 Units into the skin daily at 10 pm. ) 15 mL 11 Past Week at Unknown time  . Lancet Devices (ACCU-CHEK SOFTCLIX) lancets Use as instructed 100 each 1 Taking  . metFORMIN (GLUCOPHAGE) 500 MG tablet Take 1 tablet (500 mg total) by mouth 2 (two) times daily with a meal. 60 tablet 0 12/30/2017 at Unknown time  . metoCLOPramide (REGLAN) 10 MG tablet Take 1 tablet (10 mg total) by mouth every 6 (six) hours. (Patient not taking: Reported on 12/22/2017) 120 tablet 1 Not Taking  . ondansetron (ZOFRAN ODT) 8 MG disintegrating tablet Take 1 tablet (8 mg total) by mouth every 8 (eight) hours as needed for nausea or vomiting. 20 tablet 0   . Prenatal MV & Min w/FA-DHA (PRENATAL ADULT GUMMY/DHA/FA) 0.4-25 MG CHEW Chew 1 tablet by mouth daily. (Patient not taking: Reported on 12/31/2017) 30 tablet 12 Not Taking at Unknown time  . promethazine (PHENERGAN) 25 MG tablet Take 1 tablet (25 mg total) by mouth every 6 (six) hours as needed for nausea or vomiting. (Patient not taking: Reported on 12/22/2017) 12 tablet 0 Not Taking  .  promethazine (PHENERGAN) 25 MG tablet Take 1 tablet (25 mg total) by mouth at bedtime as needed for nausea or vomiting. 30 tablet 0   . ranitidine (ZANTAC) 150 MG tablet Take 1 tablet (150 mg total) by mouth 2 (two) times daily. (Patient not taking: Reported on 12/31/2017) 60 tablet 0 Not Taking at Unknown time  . ranitidine (ZANTAC) 150 MG tablet Take 1 tablet (150 mg total) by mouth 2 (two) times daily. 60 tablet 2     Review of Systems  Constitutional: Negative.  Negative for fatigue and fever.  HENT: Negative.   Respiratory: Negative.  Negative for shortness of breath.   Cardiovascular: Negative.  Negative for chest pain.  Gastrointestinal: Positive for nausea and vomiting. Negative for abdominal pain, constipation and diarrhea.  Genitourinary: Negative.  Negative for dysuria, vaginal bleeding and  vaginal discharge.  Neurological: Negative.  Negative for dizziness and headaches.   Physical Exam   Blood pressure 113/84, pulse (!) 111, temperature 98.5 F (36.9 C), temperature source Oral, resp. rate 20, weight 90.4 kg, last menstrual period 10/07/2017, SpO2 100 %.  Physical Exam  Nursing note and vitals reviewed. Constitutional: She is oriented to person, place, and time. She appears well-developed and well-nourished. No distress.  HENT:  Head: Normocephalic.  Eyes: Pupils are equal, round, and reactive to light.  Cardiovascular: Normal rate, regular rhythm and normal heart sounds.  Respiratory: Effort normal and breath sounds normal. No respiratory distress.  GI: Soft. Bowel sounds are normal. She exhibits no distension. There is no tenderness.  Neurological: She is alert and oriented to person, place, and time.  Skin: Skin is warm and dry.  Psychiatric: She has a normal mood and affect. Her behavior is normal. Judgment and thought content normal.   FHR: 158 bpm  MAU Course  Procedures Results for orders placed or performed during the hospital encounter of 01/14/18 (from the past 24 hour(s))  Urinalysis, Routine w reflex microscopic     Status: Abnormal   Collection Time: 01/14/18 10:20 AM  Result Value Ref Range   Color, Urine YELLOW YELLOW   APPearance HAZY (A) CLEAR   Specific Gravity, Urine 1.015 1.005 - 1.030   pH 5.0 5.0 - 8.0   Glucose, UA NEGATIVE NEGATIVE mg/dL   Hgb urine dipstick SMALL (A) NEGATIVE   Bilirubin Urine NEGATIVE NEGATIVE   Ketones, ur 80 (A) NEGATIVE mg/dL   Protein, ur 100 (A) NEGATIVE mg/dL   Nitrite NEGATIVE NEGATIVE   Leukocytes, UA NEGATIVE NEGATIVE   RBC / HPF 0-5 0 - 5 RBC/hpf   WBC, UA 0-5 0 - 5 WBC/hpf   Bacteria, UA NONE SEEN NONE SEEN   Squamous Epithelial / LPF 0-5 0 - 5   Mucus PRESENT   CBC     Status: None   Collection Time: 01/14/18 10:58 AM  Result Value Ref Range   WBC 5.5 4.0 - 10.5 K/uL   RBC 4.88 3.87 - 5.11 MIL/uL    Hemoglobin 13.4 12.0 - 15.0 g/dL   HCT 39.5 36.0 - 46.0 %   MCV 80.9 78.0 - 100.0 fL   MCH 27.5 26.0 - 34.0 pg   MCHC 33.9 30.0 - 36.0 g/dL   RDW 13.2 11.5 - 15.5 %   Platelets 274 150 - 400 K/uL  Comprehensive metabolic panel     Status: Abnormal   Collection Time: 01/14/18 10:58 AM  Result Value Ref Range   Sodium 138 135 - 145 mmol/L   Potassium 3.7 3.5 - 5.1 mmol/L  Chloride 106 98 - 111 mmol/L   CO2 16 (L) 22 - 32 mmol/L   Glucose, Bld 131 (H) 70 - 99 mg/dL   BUN 10 6 - 20 mg/dL   Creatinine, Ser 0.99 0.44 - 1.00 mg/dL   Calcium 9.3 8.9 - 10.3 mg/dL   Total Protein 8.0 6.5 - 8.1 g/dL   Albumin 3.7 3.5 - 5.0 g/dL   AST 19 15 - 41 U/L   ALT 18 0 - 44 U/L   Alkaline Phosphatase 73 38 - 126 U/L   Total Bilirubin 1.4 (H) 0.3 - 1.2 mg/dL   GFR calc non Af Amer >60 >60 mL/min   GFR calc Af Amer >60 >60 mL/min   Anion gap 16 (H) 5 - 15   MDM UA CBC, CMP IV LR bolus Multivitamin in LR Phenergan IV Pepcid IV  Patient has lost 26 pounds since ED visit on 7/30.  Consulted with Dr. Ilda Basset regarding labs and weight loss- will admit to [facility] High Risk  Assessment and Plan   1. Hyperemesis   2. Chronic hypertension during pregnancy, antepartum   3. Insulin-dependent diabetes mellitus during pregnancy, antepartum ([place])   4. [redacted] weeks gestation of pregnancy    -Admit to OB High Risk for management of hyperemesis and diabetes  [place] 01/14/2018, 10:20 AM

## 2018-01-14 NOTE — MAU Note (Signed)
Weak, can't keep anything down.  Hasn't ate in 3 wks.  Every time she throws up she pisses and there is a strong odor to it.

## 2018-01-15 LAB — BASIC METABOLIC PANEL
ANION GAP: 11 (ref 5–15)
BUN: 7 mg/dL (ref 6–20)
CHLORIDE: 108 mmol/L (ref 98–111)
CO2: 20 mmol/L — AB (ref 22–32)
Calcium: 8.4 mg/dL — ABNORMAL LOW (ref 8.9–10.3)
Creatinine, Ser: 0.86 mg/dL (ref 0.44–1.00)
GFR calc non Af Amer: >60 mL/min (ref >60)
Glucose, Bld: 129 mg/dL — ABNORMAL HIGH (ref 70–99)
Potassium: 3.3 mmol/L — ABNORMAL LOW (ref 3.5–5.1)
SODIUM: 139 mmol/L (ref 135–145)

## 2018-01-15 LAB — CULTURE, OB URINE

## 2018-01-15 LAB — GLUCOSE, CAPILLARY
GLUCOSE-CAPILLARY: 144 mg/dL — AB (ref 70–99)
Glucose-Capillary: 154 mg/dL — ABNORMAL HIGH (ref 70–99)
Glucose-Capillary: 190 mg/dL — ABNORMAL HIGH (ref 70–99)

## 2018-01-15 MED ORDER — METOCLOPRAMIDE HCL 10 MG PO TABS
10.0000 mg | ORAL_TABLET | Freq: Four times a day (QID) | ORAL | Status: DC
  Administered 2018-01-15 – 2018-01-16 (×2): 10 mg via ORAL
  Filled 2018-01-15 (×2): qty 1

## 2018-01-15 MED ORDER — FAMOTIDINE 20 MG PO TABS
20.0000 mg | ORAL_TABLET | Freq: Two times a day (BID) | ORAL | Status: DC
  Administered 2018-01-15 – 2018-01-16 (×2): 20 mg via ORAL
  Filled 2018-01-15 (×2): qty 1

## 2018-01-15 MED ORDER — SODIUM CHLORIDE 0.9% FLUSH: 3.0000 mL | INTRAVENOUS | Status: DC | PRN

## 2018-01-15 MED ORDER — SODIUM CHLORIDE 0.9 % IV SOLN: 250.0000 mL | INTRAVENOUS | Status: DC | PRN

## 2018-01-15 MED ORDER — INSULIN ASPART 100 UNIT/ML ~~LOC~~ SOLN
0.0000 [IU] | SUBCUTANEOUS | Status: DC
  Administered 2018-01-15: 3 [IU] via SUBCUTANEOUS
  Administered 2018-01-15: 5 [IU] via SUBCUTANEOUS

## 2018-01-15 MED ORDER — ONDANSETRON HCL 4 MG PO TABS
8.0000 mg | ORAL_TABLET | Freq: Four times a day (QID) | ORAL | Status: DC
  Administered 2018-01-15 – 2018-01-16 (×2): 8 mg via ORAL
  Filled 2018-01-15 (×2): qty 2

## 2018-01-15 MED ORDER — SODIUM CHLORIDE 0.9% FLUSH: 3.0000 mL | Freq: Two times a day (BID) | INTRAVENOUS | Status: DC

## 2018-01-15 MED ORDER — GLYCOPYRROLATE 1 MG PO TABS
1.0000 mg | ORAL_TABLET | Freq: Two times a day (BID) | ORAL | Status: DC
  Administered 2018-01-15 – 2018-01-16 (×3): 1 mg via ORAL
  Filled 2018-01-15 (×3): qty 1

## 2018-01-15 NOTE — Progress Notes (Signed)
Initial Nutrition Assessment  DOCUMENTATION CODES:   Obesity unspecified  INTERVENTION:  GDM diet Encouraged small freq low fat meals - as per GDM diet   NUTRITION DIAGNOSIS:  Inadequate oral intake related to nausea, vomiting as evidenced by percent weight loss.  GOAL:  Patient will meet greater than or equal to 90% of their needs, Weight gain  MONITOR:  PO intake  REASON FOR ASSESSMENT:  Other (Comment)(Hyperemesis)   ASSESSMENT:   11 5/7 weeks. 5+ week hx of n/v. weight 7/19 224 lbs, BMI 36.2. 21 lb weight loss to date.  GDM diet Tol beverages well at time of my visit and had consumed part of lunch sucessfully    Diet Order:   Diet Order            Diet gestational carb mod Fluid consistency: Thin; Room service appropriate? Yes  Diet effective now              EDUCATION NEEDS:   No education needs have been identified at this time Pt had no questions/concerns about GDM diet  Skin:  Skin Assessment: Reviewed RN Assessment  Height:   Ht Readings from Last 1 Encounters:  01/14/18 5\' 6"  (1.676 m)    Weight:   Wt Readings from Last 1 Encounters:  01/15/18 92.4 kg    Ideal Body Weight:     BMI:  Body mass index is 32.86 kg/m.  Estimated Nutritional Needs:   Kcal:  22-2400 ( 2nd trimester)  Protein:  100-110  Fluid:  2.4 L    Elisabeth CaraKatherine Brylyn Novakovich M.Odis LusterEd. R.D. LDN Neonatal Nutrition Support Specialist/RD III Pager [phone]      Phone [phone]

## 2018-01-15 NOTE — Progress Notes (Addendum)
Daily Antepartum Note  Admission Date: 01/14/2018 Current Date: 01/15/2018 9:50 AM  Kathryn Shelton is a 27 y.o. [id number] @ 5943w5d , HD#2, admitted for n/v of pregnancy.  Pregnancy complicated by: Patient Active Problem List   Diagnosis Date Noted  . Nausea and vomiting during pregnancy prior to [redacted] weeks gestation 01/14/2018  . Bacterial vaginitis 01/02/2018  . Supervision of high risk pregnancy, antepartum 12/31/2017  . Chronic hypertension during pregnancy, antepartum 12/31/2017  . DM2 in pregnancy 12/31/2017    Overnight/24hr events:  Had asymptomatic temp (see below)  Subjective:  Patient states she feels better. No n/v but doesn't have an appetite but RN states she had normal food yesterday. +psytallism   Objective:     Current Vital Signs 24h Vital Sign Ranges  T 98.6 F (37 C) Temp  Avg: 99 F (37.2 C)  Min: 98.3 F (36.8 C)  Max: 100.5 F (38.1 C)  BP 110/76 BP  Min: 90/79  Max: 127/81  HR 77 Pulse  Avg: 91.4  Min: 71  Max: 111  RR 18 Resp  Avg: 18.3  Min: 18  Max: 20  SaO2 100 % Room Air SpO2  Avg: 98.9 %  Min: 95 %  Max: 100 %       24 Hour I/O Current Shift I/O  Time Ins Outs 08/22 0701 - 08/23 0700 In: 4664.7 [P.O.:240; I.V.:1050.2] Out: 1200 [Urine:1200] No intake/output data recorded.   Patient Vitals for the past 24 hrs:  BP Temp Temp src Pulse Resp SpO2 Height Weight  01/15/18 0809 110/76 98.6 F (37 C) Oral 77 18 100 % - -  01/15/18 0433 127/81 98.3 F (36.8 C) Oral 100 18 98 % - 92.4 kg  01/15/18 0000 102/73 99.1 F (37.3 C) Oral 76 18 99 % - -  01/14/18 2030 90/79 99.1 F (37.3 C) Oral 94 18 95 % - -  01/14/18 1553 100/66 98.6 F (37 C) Oral (!) 111 18 100 % - -  01/14/18 1225 118/77 (!) 100.5 F (38.1 C) Oral 71 18 100 % 5\' 6"  (1.676 m) 90.4 kg  01/14/18 0954 113/84 98.5 F (36.9 C) Oral (!) 111 20 100 % - 90.4 kg    Physical exam: General: Well nourished, well developed female in no acute distress. Pt looks better Abdomen: obese,  nttp Cardiovascular: S1, S2 normal, no murmur, rub or gallop, regular rate and rhythm Respiratory: CTAB Extremities: no clubbing, cyanosis or edema Skin: Warm and dry.   Medications: Current Facility-Administered Medications  Medication Dose Route Frequency Provider Last Rate Last Dose  . 0.9 %  sodium chloride infusion  250 mL Intravenous PRN [place] BingPickens, Brandye Inthavong, MD      . acetaminophen (TYLENOL) tablet 650 mg  650 mg Oral Q4H PRN [place] BingPickens, Sybel Standish, MD      . diphenhydrAMINE (BENADRYL) capsule 25 mg  25 mg Oral Q8H [place] BingPickens, Opel Lejeune, MD   25 mg at 01/15/18 0512  . docusate sodium (COLACE) capsule 100 mg  100 mg Oral BID PRN [place] BingPickens, Sussan Meter, MD      . enoxaparin (LOVENOX) injection 40 mg  40 mg Subcutaneous Q24H [place] BingPickens, Iren Whipp, MD   40 mg at 01/14/18 1346  . famotidine (PEPCID) IVPB 20 mg premix  20 mg Intravenous Q12H [place] BingPickens, Raiden Haydu, MD 100 mL/hr at 01/15/18 0936 20 mg at 01/15/18 0936  . insulin aspart (novoLOG) injection 0-14 Units  0-14 Units Subcutaneous Q6H  BingPickens, Florian Chauca, MD   3 Units at 01/15/18 0522  . metoCLOPramide (REGLAN) injection 10  mg  10 mg Intravenous Q6H [place] Bing, MD   10 mg at 01/15/18 0512  . ondansetron (ZOFRAN) 8 mg in sodium chloride 0.9 % 50 mL IVPB  8 mg Intravenous Q6H [place] Bing, MD   Stopped at 01/15/18 0209  . pyridOXINE (VITAMIN B-6) tablet 25 mg  25 mg Oral BID [place] Bing, MD   25 mg at 01/14/18 2122  . scopolamine (TRANSDERM-SCOP) 1 MG/3DAYS 1.5 mg  1 patch Transdermal Q72H [place] Bing, MD   1.5 mg at 01/14/18 1736  . sodium chloride flush (NS) 0.9 % injection 3 mL  3 mL Intravenous Q12H [place] Bing, MD      . sodium chloride flush (NS) 0.9 % injection 3 mL  3 mL Intravenous PRN  Bing, MD        Labs:  Recent Labs  Lab 01/14/18 1058  WBC 5.5  HGB 13.4  HCT 39.5  PLT 274   Negative: TSH, Mg A1c: 8.2 (was 12.1 in 2018)  Recent Labs  Lab 01/14/18 1058 01/15/18 0604  NA 138 139  K 3.7 3.3*  CL 106 108   CO2 16* 20*  BUN 10 7  CREATININE 0.99 0.86  CALCIUM 9.3 8.4*  PROT 8.0  --   BILITOT 1.4*  --   ALKPHOS 73  --   ALT 18  --   AST 19  --   GLUCOSE 131* 129*   Pending: UCx  Results for ESHIKA, RECKART (MRN [id number]) as of 01/15/2018 09:54  Ref. Range 01/14/2018 12:42 01/14/2018 17:35 01/14/2018 23:41 01/15/2018 05:17 01/15/2018 06:04  Glucose-Capillary Latest Ref Range: 70 - 99 mg/dL 85 295 (H) 80 284 (H)     Radiology: no new imaging  Assessment & Plan:  Pt doing better *Pregnancy: routine care. PRN FHTs *N/v of pregnancy: keep on dm diet, pt told to pick and choose. D/c MIVF since pt states she's voiding a lot. Continue current regimen. Try to put on PO meds tomorros *DM2: will switch from q6h to AM fasting and 2hr post prandial BS checks. Continue with SSI. Pt was on metformin and lantus (pt unsure but thinks 13-14 units) that she took in the mid to late afternoon. A1c elevated *PPx: lovenox, oob ad lib *FEN/GI: full liquids. SLIV *Dispo: when able to take more PO  Cornelia Copa. MD Attending Center for [facility])

## 2018-01-15 NOTE — Progress Notes (Signed)
Patient able to tolerate regular diet. Patient states she is not nauseous just tired.

## 2018-01-16 LAB — GLUCOSE, CAPILLARY: GLUCOSE-CAPILLARY: 90 mg/dL (ref 70–99)

## 2018-01-16 MED ORDER — ONDANSETRON HCL 8 MG PO TABS: 8.0000 mg | ORAL_TABLET | Freq: Four times a day (QID) | ORAL | 0 refills | Status: DC

## 2018-01-16 MED ORDER — GLYCOPYRROLATE 1 MG PO TABS: 1.0000 mg | ORAL_TABLET | Freq: Two times a day (BID) | ORAL | 1 refills | Status: DC

## 2018-01-16 MED ORDER — METOCLOPRAMIDE HCL 10 MG PO TABS: 10.0000 mg | ORAL_TABLET | Freq: Three times a day (TID) | ORAL | 1 refills | Status: DC

## 2018-01-16 NOTE — Discharge Instructions (Signed)

## 2018-01-16 NOTE — Plan of Care (Signed)
Pt. Condition will continue to improve 

## 2018-01-16 NOTE — Progress Notes (Signed)
Discharged home, ambulatory, in stable condition. 

## 2018-01-16 NOTE — Progress Notes (Signed)
Discharge instructions given to patient and she verbalized understanding of all instructions given. Written copy of AVS given to patient. 

## 2018-01-16 NOTE — Discharge Summary (Signed)
Physician Discharge Summary  Patient ID: Kathryn Shelton MRN: [id number] DOB/AGE: February 03, 1991 27 y.o.  Admit date: 01/14/2018 Discharge date: 01/16/2018  Admission Diagnoses: [id number], ptyalism IDDM CHTN  Discharge Diagnoses:  Active Problems:   Nausea and vomiting during pregnancy prior to [redacted] weeks gestation   Discharged Condition: good  Hospital Course: pt was rehydrated and weaned off IV meds over a period of 2 days Tolerated regular food and liquids within 24 hours  Consults: None  Significant Diagnostic Studies:   Treatments: IV hydration  Discharge Exam: Blood pressure 114/79, pulse 86, temperature 98.7 F (37.1 C), temperature source Oral, resp. rate 18, height 5' 6"  (1.676 m), weight 92.4 kg, last menstrual period 10/07/2017, SpO2 100 %. General appearance: alert, cooperative and no distress GI: soft, non-tender; bowel sounds normal; no masses,  no organomegaly  Disposition: Discharge disposition: 01-Home or Self Care       Discharge Instructions    Diet - low sodium heart healthy   Complete by:  As directed    Increase activity slowly   Complete by:  As directed      Allergies as of 01/16/2018      Reactions   Shellfish-derived Products Swelling      Medication List    TAKE these medications   ACCU-CHEK AVIVA PLUS w/Device Kit Check blood sugars fasting and at bedtime   accu-chek softclix lancets Use as instructed   Doxylamine-Pyridoxine 10-10 MG Tbec Take 2 tablets by mouth at bedtime.   glucose blood test strip Use as instructed   glycopyrrolate 1 MG tablet Commonly known as:  ROBINUL Take 1 tablet (1 mg total) by mouth 2 (two) times daily.   Insulin Glargine 100 UNIT/ML Solostar Pen Commonly known as:  LANTUS Inject 10 Units into the skin daily at 10 pm. What changed:  how much to take   metFORMIN 500 MG tablet Commonly known as:  GLUCOPHAGE Take 1 tablet (500 mg total) by mouth 2 (two) times daily with a meal.   metoCLOPramide  10 MG tablet Commonly known as:  REGLAN Take 1 tablet (10 mg total) by mouth every 6 (six) hours. What changed:  Another medication with the same name was added. Make sure you understand how and when to take each.   metoCLOPramide 10 MG tablet Commonly known as:  REGLAN Take 1 tablet (10 mg total) by mouth 3 (three) times daily before meals. What changed:  You were already taking a medication with the same name, and this prescription was added. Make sure you understand how and when to take each.   ondansetron 8 MG disintegrating tablet Commonly known as:  ZOFRAN-ODT Take 1 tablet (8 mg total) by mouth every 8 (eight) hours as needed for nausea or vomiting.   ondansetron 8 MG tablet Commonly known as:  ZOFRAN Take 1 tablet (8 mg total) by mouth every 6 (six) hours.   Prenatal Adult Gummy/DHA/FA 0.4-25 MG Chew Chew 1 tablet by mouth daily.   promethazine 25 MG tablet Commonly known as:  PHENERGAN Take 1 tablet (25 mg total) by mouth at bedtime as needed for nausea or vomiting.   ranitidine 150 MG tablet Commonly known as:  ZANTAC Take 1 tablet (150 mg total) by mouth 2 (two) times daily.      [place]   Why:  high risk cl;inic Contact information: [phone]  SignedFlorian Buff 01/16/2018, 8:47 AM

## 2018-01-18 ENCOUNTER — Encounter: Payer: Medicaid Other | Attending: Internal Medicine | Admitting: Registered"

## 2018-01-18 ENCOUNTER — Encounter: Payer: Self-pay | Admitting: Registered"

## 2018-01-18 DIAGNOSIS — I10 Essential (primary) hypertension: Secondary | ICD-10-CM | POA: Insufficient documentation

## 2018-01-18 DIAGNOSIS — Z713 Dietary counseling and surveillance: Secondary | ICD-10-CM | POA: Insufficient documentation

## 2018-01-18 DIAGNOSIS — E1165 Type 2 diabetes mellitus with hyperglycemia: Secondary | ICD-10-CM | POA: Insufficient documentation

## 2018-01-18 DIAGNOSIS — E782 Mixed hyperlipidemia: Secondary | ICD-10-CM | POA: Diagnosis not present

## 2018-01-18 DIAGNOSIS — O24119 Pre-existing diabetes mellitus, type 2, in pregnancy, unspecified trimester: Secondary | ICD-10-CM

## 2018-01-18 NOTE — Patient Instructions (Signed)
Aim to eat 3 balanced meals per day, snacks if hungry Check your blood sugar 4x per day, ask you doctor what your target numbers are Ask you doctor about the less expensive insulin (NPH) When getting your meformin, also get more supplies for your glucose meter.

## 2018-01-18 NOTE — Progress Notes (Signed)
Diabetes Self-Management Education  Visit Type: First/Initial  Appt. Start Time: 1015 Appt. End Time: 1115  01/18/2018  [name], identified by name and date of birth, is a 27 y.o. female with a diagnosis of Diabetes: Gestational Diabetes.   ASSESSMENT Patient states she has not been taking metformin for about a week because she ran out. Patient also states she is supposed to be using insulin (Lantus), but it is too expensive and has not filled the prescription. RD instructed patient to ask her doctor about NPH insulin which may be a less expensive option. Patient was not aware of her A1c per chart it had been ~12% but most recent was 8.3% Patient is not sure what she did to bring it down. Patient's spouse said he was able to get her to join him at the gym for awhile, but not much recently. Patient states she has spent time in the hospital recently. Today her random BG was 259 mg/dL. She ate some cake in the waiting area, but not much time had passed since eating and she had no other food today.  This patient is accompanied in the office by her spouse. Patient's spouse states he tries to encourage his wife to exercise more and choose foods that will be better to manage her blood sugar. Pt's spouse he support her by trying to help her stay calm as well as he cooks meals for the family. Patient had a couple of phone calls during the visit and states that her sister and her friends call her all the time and tells her their troubles.  Patient states she likes sweet things, not a fan of sparkling waters but was open to diet cranberry juice.  Patient was distracted by her young daughter and her phone, but her husband seemed fully engaged in the education and may be able to be very supportive in necessary changes to manage blood sugar.  Meter provided: Accu-chek Guide Lot # [id number] Exp: 10/03/18 BG: 259 mg/dL  Diabetes Self-Management Education - 01/18/18 1453      Visit Information   Visit Type  First/Initial      Initial Visit   Diabetes Type  Gestational Diabetes    Are you currently following a meal plan?  No    Are you taking your medications as prescribed?  No    ran out x1 week, can't afford insulin   Date Diagnosed  "all my life"      Health Coping   How would you rate your overall health?  Poor      Psychosocial Assessment   Patient Belief/Attitude about Diabetes  Motivated to manage diabetes    Other persons present  Spouse/SO    How often do you need to have someone help you when you read instructions, pamphlets, or other written materials from your doctor or pharmacy?  1 - Never    What is the last grade level you completed in school?  12      Complications   Last HgB A1C per patient/outside source  8.3 %    How often do you check your blood sugar?  0 times/day (not testing)    Have you had a dilated eye exam in the past 12 months?  No    Have you had a dental exam in the past 12 months?  No    Are you checking your feet?  No      Exercise   Exercise Type  ADL's    How many  days per week to you exercise?  0    How many minutes per day do you exercise?  0    Total minutes per week of exercise  0      Patient Education   Previous Diabetes Education  No    Nutrition management   Role of diet in the treatment of diabetes and the relationship between the three main macronutrients and blood glucose level    Physical activity and exercise   Role of exercise on diabetes management, blood pressure control and cardiac health.    Medications  Reviewed patients medication for diabetes, action, purpose, timing of dose and side effects.    Monitoring  Taught/evaluated SMBG meter.    Preconception care  Reviewed with patient blood glucose goals with pregnancy      Individualized Goals (developed by patient)   Nutrition  General guidelines for healthy choices and portions discussed    Medications  take my medication as prescribed      Outcomes   Expected  Outcomes  Demonstrated interest in learning. Expect positive outcomes    Future DMSE  PRN    Program Status  Not Completed     Individualized Plan for Diabetes Self-Management Training:   Learning Objective:  Patient will have a greater understanding of diabetes self-management. Patient education plan is to attend individual and/or group sessions per assessed needs and concerns.   Patient Instructions  Aim to eat 3 balanced meals per day, snacks if hungry Check your blood sugar 4x per day, ask you doctor what your target numbers are Ask you doctor about the less expensive insulin (NPH) When getting your meformin, also get more supplies for your glucose meter.  Expected Outcomes:  Demonstrated interest in learning. Expect positive outcomes  Education material provided: A1C conversion sheet, My Plate and Carbohydrate counting sheet , snack sheet, Gestational Diabetes packet  If problems or questions, patient to contact team via:  Phone  Future DSME appointment: PRN

## 2018-01-24 DIAGNOSIS — G4733 Obstructive sleep apnea (adult) (pediatric): Secondary | ICD-10-CM | POA: Diagnosis not present

## 2018-01-24 NOTE — Procedures (Signed)
  Patient Name: Kathryn Shelton, Alling Date: 01/13/2018 Gender: Female D.O.B: Oct 02, 1990 Age (years): 27 Referring Provider: Jamison Oka MD Height (inches): 66 Interpreting Physician: Jetty Duhamel MD, ABSM Weight (lbs): 215 RPSGT: Celene Kras BMI: 35 MRN: [id number] Neck Size: 14.00  CLINICAL INFORMATION Sleep Study Type: HST Indication for sleep study: Excessive Daytime Sleepiness, Snoring  Epworth Sleepiness Score: 13  SLEEP STUDY TECHNIQUE A multi-channel overnight portable sleep study was performed. The channels recorded were: nasal airflow, thoracic respiratory movement, and oxygen saturation with a pulse oximetry. Snoring was also monitored.  MEDICATIONS Patient self administered medications include: none reported.  SLEEP ARCHITECTURE Patient was studied for 300.1 minutes. The sleep efficiency was 85.7 % and the patient was supine for 36.1%. The arousal index was 0.0 per hour.  RESPIRATORY PARAMETERS The overall AHI was 10.6 per hour, with a central apnea index of 0.0 per hour. The oxygen nadir was 80% during sleep.  CARDIAC DATA Mean heart rate during sleep was 86.2 bpm.  IMPRESSIONS - Mild obstructive sleep apnea occurred during this study (AHI = 10.6/h). - No significant central sleep apnea occurred during this study (CAI = 0.0/h). - Oxygen desaturation was noted during this study (Min O2 = 80%, Mean 95%). - Patient snored.  DIAGNOSIS - Obstructive Sleep Apnea (327.23 [G47.33 ICD-10])  RECOMMENDATIONS - Treatment for mild OSA is directed at symptoms. Conservative measures might include observation, weight loss, sleep position off back. Other options including CPAP, a fitted oral appliance or ENT evaluation, would be based on clinical judgment. - Be careful with alcohol, sedatives and other CNS depressants that may worsen sleep apnea and disrupt normal sleep architecture. - Sleep hygiene should be reviewed to assess factors that may improve sleep  quality. - Weight management and regular exercise should be initiated or continued.  [Electronically signed] 01/24/2018 10:46 AM  Jetty Duhamel MD, ABSM Diplomate, American Board of Sleep Medicine   NPI: [id number]                           Jetty Duhamel Diplomate, American Board of Sleep Medicine  ELECTRONICALLY SIGNED ON:  01/24/2018, 10:42 AM [facility] PH: [phone]   FX: [phone] ACCREDITED BY THE AMERICAN ACADEMY OF SLEEP MEDICINE

## 2018-01-28 ENCOUNTER — Ambulatory Visit (INDEPENDENT_AMBULATORY_CARE_PROVIDER_SITE_OTHER): Payer: Medicaid Other | Admitting: Obstetrics and Gynecology

## 2018-01-28 ENCOUNTER — Encounter: Payer: Self-pay | Admitting: Obstetrics and Gynecology

## 2018-01-28 VITALS — BP 115/77 | HR 92 | Wt 209.9 lb

## 2018-01-28 DIAGNOSIS — O10911 Unspecified pre-existing hypertension complicating pregnancy, first trimester: Secondary | ICD-10-CM

## 2018-01-28 DIAGNOSIS — O24119 Pre-existing diabetes mellitus, type 2, in pregnancy, unspecified trimester: Secondary | ICD-10-CM

## 2018-01-28 DIAGNOSIS — O10919 Unspecified pre-existing hypertension complicating pregnancy, unspecified trimester: Secondary | ICD-10-CM

## 2018-01-28 DIAGNOSIS — Z23 Encounter for immunization: Secondary | ICD-10-CM

## 2018-01-28 DIAGNOSIS — O0990 Supervision of high risk pregnancy, unspecified, unspecified trimester: Secondary | ICD-10-CM

## 2018-01-28 DIAGNOSIS — O24111 Pre-existing diabetes mellitus, type 2, in pregnancy, first trimester: Secondary | ICD-10-CM

## 2018-01-28 DIAGNOSIS — O0991 Supervision of high risk pregnancy, unspecified, first trimester: Secondary | ICD-10-CM

## 2018-01-28 MED ORDER — ASPIRIN EC 81 MG PO TBEC: 81.0000 mg | DELAYED_RELEASE_TABLET | Freq: Every day | ORAL | 2 refills | Status: DC

## 2018-01-28 MED ORDER — VITAFOL GUMMIES 3.33-0.333-34.8 MG PO CHEW: 2.0000 | CHEWABLE_TABLET | Freq: Every day | ORAL | 6 refills | Status: AC

## 2018-01-28 NOTE — Progress Notes (Signed)
   PRENATAL VISIT NOTE  Subjective:  Kathryn Shelton is a 27 y.o. G3P2002 at [redacted]w[redacted]d being seen today for ongoing prenatal care.  She is currently monitored for the following issues for this high-risk pregnancy and has Supervision of high risk pregnancy, antepartum; Chronic hypertension during pregnancy, antepartum; Pre-existing type 2 diabetes mellitus during pregnancy, antepartum; Bacterial vaginitis; and Nausea and vomiting during pregnancy prior to [redacted] weeks gestation on their problem list.  Patient reports no complaints.  Contractions: Not present. Vag. Bleeding: None.  Movement: Absent. Denies leaking of fluid.   The following portions of the patient's history were reviewed and updated as appropriate: allergies, current medications, past family history, past medical history, past social history, past surgical history and problem list. Problem list updated.  Objective:   Vitals:   01/28/18 1006  BP: 115/77  Pulse: 92  Weight: 209 lb 14.4 oz (95.2 kg)    Fetal Status: Fetal Heart Rate (bpm): 156   Movement: Absent     General:  Alert, oriented and cooperative. Patient is in no acute distress.  Skin: Skin is warm and dry. No rash noted.   Cardiovascular: Normal heart rate noted  Respiratory: Normal respiratory effort, no problems with respiration noted  Abdomen: Soft, gravid, appropriate for gestational age.  Pain/Pressure: Absent     Pelvic: Cervical exam deferred        Extremities: Normal range of motion.  Edema: None  Mental Status: Normal mood and affect. Normal behavior. Normal judgment and thought content.   Assessment and Plan:  Pregnancy: G3P2002 at [redacted]w[redacted]d  1. Supervision of high risk pregnancy, antepartum Patient is doing well  Anatomy ultrasound ordered - Flu Vaccine QUAD 36+ mos IM (Fluarix, Quad PF) - Obstetric Panel, Including HIV - Genetic Screening  2. Pre-existing type 2 diabetes mellitus during pregnancy, antepartum Patient did not bring CBG log and admits to  not checking CBGs regularly. She was prescribed Lantus 10 on Monday which she started on Tuesday. She reports post prandial of 200 on Wednesday Patient is scheduled to follow up with endocrinologist next week. Advised patient to keep a CBG log for insulin dosage adjustment Fetal echo ordered  3. Chronic hypertension during pregnancy, antepartum Start ASA Normotensive without medication  Preterm labor symptoms and general obstetric precautions including but not limited to vaginal bleeding, contractions, leaking of fluid and fetal movement were reviewed in detail with the patient. Please refer to After Visit Summary for other counseling recommendations.  No follow-ups on file.  No future appointments.  Catalina Antigua, MD

## 2018-02-01 ENCOUNTER — Other Ambulatory Visit: Payer: Medicaid Other

## 2018-02-15 ENCOUNTER — Other Ambulatory Visit: Payer: Medicaid Other

## 2018-02-25 ENCOUNTER — Ambulatory Visit (INDEPENDENT_AMBULATORY_CARE_PROVIDER_SITE_OTHER): Payer: Medicaid Other | Admitting: Obstetrics and Gynecology

## 2018-02-25 ENCOUNTER — Encounter: Payer: Self-pay | Admitting: Obstetrics and Gynecology

## 2018-02-25 VITALS — BP 110/76 | HR 88 | Wt 211.7 lb

## 2018-02-25 DIAGNOSIS — O0990 Supervision of high risk pregnancy, unspecified, unspecified trimester: Secondary | ICD-10-CM

## 2018-02-25 DIAGNOSIS — O10919 Unspecified pre-existing hypertension complicating pregnancy, unspecified trimester: Secondary | ICD-10-CM

## 2018-02-25 DIAGNOSIS — O24119 Pre-existing diabetes mellitus, type 2, in pregnancy, unspecified trimester: Secondary | ICD-10-CM

## 2018-02-25 NOTE — Addendum Note (Signed)
Addended by: Dalphine Handing on: 02/25/2018 02:42 PM   Modules accepted: Orders

## 2018-02-25 NOTE — Progress Notes (Signed)
Pt is here for ROB. G3P2 [redacted]w[redacted]d.

## 2018-02-25 NOTE — Progress Notes (Signed)
   PRENATAL VISIT NOTE  Subjective:  Kathryn Shelton is a 27 y.o. G3P2002 at [redacted]w[redacted]d being seen today for ongoing prenatal care.  She is currently monitored for the following issues for this high-risk pregnancy and has Supervision of high risk pregnancy, antepartum; Chronic hypertension during pregnancy, antepartum; Pre-existing type 2 diabetes mellitus during pregnancy, antepartum; Bacterial vaginitis; and Nausea and vomiting during pregnancy prior to [redacted] weeks gestation on their problem list.  Patient reports no complaints.  Contractions: Not present. Vag. Bleeding: None.   . Denies leaking of fluid.   The following portions of the patient's history were reviewed and updated as appropriate: allergies, current medications, past family history, past medical history, past social history, past surgical history and problem list. Problem list updated.  Objective:   Vitals:   02/25/18 1307  BP: 110/76  Pulse: 88  Weight: 211 lb 11.2 oz (96 kg)    Fetal Status: Fetal Heart Rate (bpm): 147         General:  Alert, oriented and cooperative. Patient is in no acute distress.  Skin: Skin is warm and dry. No rash noted.   Cardiovascular: Normal heart rate noted  Respiratory: Normal respiratory effort, no problems with respiration noted  Abdomen: Soft, gravid, appropriate for gestational age.  Pain/Pressure: Absent     Pelvic: Cervical exam deferred        Extremities: Normal range of motion.  Edema: None  Mental Status: Normal mood and affect. Normal behavior. Normal judgment and thought content. Patient appears to have limited understanding of vocabulary, had to use very simple language to explain issues   Assessment and Plan:  Pregnancy: G3P2002 at [redacted]w[redacted]d  1. Supervision of high risk pregnancy, antepartum - NOB panel today (not done at original visit due to lack of access)  2. Pre-existing type 2 diabetes mellitus during pregnancy, antepartum - Pt not checking her BG or using insulin as  prescribed. States she forgets or just doesn't check, no other reason. When asked about her understanding of diabetes, patient reports no understanding. She also reports she does not understand how to inject herself with insulin and has not gotten supplies. - I reviewed diabetes and the short-term and long-term potential complications of diabetes and the importance of control. I reviewed risks such as damage to kidneys, vessels, eyes, liver, etc. I also reviewed the importance of tight control during pregnancy and risks to fetus of poor control such as fetal heart defects, macrosomia, seizures and death. Patient verbalizes understanding of these risks and verbalizes that she will start being compliance with checking BG and taking meds. - will arrange for diabetic teaching, insulin teaching - will arrange for f/u with endocrinology - has appt with Dr. Elizebeth Brooking end of October - has anatomy US scheduled  3. Chronic hypertension during pregnancy, antepartum cont baby ASA Stable, no meds  Preterm labor symptoms and general obstetric precautions including but not limited to vaginal bleeding, contractions, leaking of fluid and fetal movement were reviewed in detail with the patient. Please refer to After Visit Summary for other counseling recommendations.  Return in about 2 weeks (around 03/11/2018) for OB visit (MD).  Future Appointments  Date Time Provider Department Center  03/09/2018 11:00 AM WOC-EDUCATION [facility]  03/09/2018  1:30 PM [facility] [place] 1 [facility]-M[facility]-US  03/11/2018  3:45 PM Conan Bowens, MD [facility] None    Conan Bowens, MD

## 2018-02-25 NOTE — Progress Notes (Signed)
Discussed with pt in detail about diabetes, diet, and how important it is to take her insulin, check and bring all CBG readings to each appt. During conversation, pt states her insurance would not cover the insulin. I called her pharmacy and the insulin rx is $3. Pt told me she had to do this during her previous pregnancies and "just don't feel like going through this again." Her friends were in the room and also explained to her how important it is for her to be compliant. Pt scheduled for insulin teaching 03/09/18 and agrees to bring insulin supplies to the appt. Endocrinologist referral ordered.

## 2018-02-26 LAB — OBSTETRIC PANEL, INCLUDING HIV
Antibody Screen: NEGATIVE
BASOS ABS: 0 10*3/uL (ref 0.0–0.2)
Basos: 0 %
EOS (ABSOLUTE): 0.1 10*3/uL (ref 0.0–0.4)
Eos: 1 %
HEP B S AG: NEGATIVE
HIV Screen 4th Generation wRfx: NONREACTIVE
Hematocrit: 32.7 % — ABNORMAL LOW (ref 34.0–46.6)
Hemoglobin: 11.1 g/dL (ref 11.1–15.9)
IMMATURE GRANULOCYTES: 0 %
Immature Grans (Abs): 0 10*3/uL (ref 0.0–0.1)
LYMPHS ABS: 1.7 10*3/uL (ref 0.7–3.1)
Lymphs: 30 %
MCH: 27.5 pg (ref 26.6–33.0)
MCHC: 33.9 g/dL (ref 31.5–35.7)
MCV: 81 fL (ref 79–97)
MONOS ABS: 0.3 10*3/uL (ref 0.1–0.9)
Monocytes: 5 %
NEUTROS PCT: 64 %
Neutrophils Absolute: 3.6 10*3/uL (ref 1.4–7.0)
PLATELETS: 353 10*3/uL (ref 150–450)
RBC: 4.04 x10E6/uL (ref 3.77–5.28)
RDW: 14.6 % (ref 12.3–15.4)
RPR: NONREACTIVE
Rh Factor: POSITIVE
Rubella Antibodies, IGG: 1.5 index (ref >0.99)
WBC: 5.7 10*3/uL (ref 3.4–10.8)

## 2018-03-01 ENCOUNTER — Other Ambulatory Visit: Payer: Self-pay | Admitting: Obstetrics and Gynecology

## 2018-03-01 DIAGNOSIS — O0990 Supervision of high risk pregnancy, unspecified, unspecified trimester: Secondary | ICD-10-CM

## 2018-03-09 ENCOUNTER — Other Ambulatory Visit: Payer: Self-pay

## 2018-03-09 ENCOUNTER — Ambulatory Visit (HOSPITAL_COMMUNITY)
Admission: RE | Admit: 2018-03-09 | Discharge: 2018-03-09 | Disposition: A | Discharge status: Home / Self Care | Payer: Medicaid Other | Source: Ambulatory Visit | Attending: Obstetrics and Gynecology | Admitting: Obstetrics and Gynecology

## 2018-03-09 ENCOUNTER — Inpatient Hospital Stay (HOSPITAL_COMMUNITY)
Admission: AD | Admit: 2018-03-09 | Discharge: 2018-03-09 | DRG: 832 | Disposition: A | Discharge status: Home / Self Care | Payer: Medicaid Other | Source: Ambulatory Visit | Attending: Obstetrics & Gynecology | Admitting: Obstetrics & Gynecology

## 2018-03-09 ENCOUNTER — Encounter (HOSPITAL_COMMUNITY): Payer: Self-pay

## 2018-03-09 ENCOUNTER — Ambulatory Visit (HOSPITAL_BASED_OUTPATIENT_CLINIC_OR_DEPARTMENT_OTHER)
Admission: RE | Admit: 2018-03-09 | Discharge: 2018-03-09 | Disposition: A | Discharge status: Home / Self Care | Payer: Medicaid Other | Source: Ambulatory Visit | Attending: Obstetrics and Gynecology | Admitting: Obstetrics and Gynecology

## 2018-03-09 ENCOUNTER — Other Ambulatory Visit (HOSPITAL_COMMUNITY): Payer: Self-pay | Admitting: *Deleted

## 2018-03-09 DIAGNOSIS — Z79899 Other long term (current) drug therapy: Secondary | ICD-10-CM | POA: Diagnosis not present

## 2018-03-09 DIAGNOSIS — O24119 Pre-existing diabetes mellitus, type 2, in pregnancy, unspecified trimester: Secondary | ICD-10-CM | POA: Diagnosis present

## 2018-03-09 DIAGNOSIS — O99212 Obesity complicating pregnancy, second trimester: Secondary | ICD-10-CM | POA: Diagnosis present

## 2018-03-09 DIAGNOSIS — O10919 Unspecified pre-existing hypertension complicating pregnancy, unspecified trimester: Secondary | ICD-10-CM

## 2018-03-09 DIAGNOSIS — O4102X0 Oligohydramnios, second trimester, not applicable or unspecified: Secondary | ICD-10-CM | POA: Insufficient documentation

## 2018-03-09 DIAGNOSIS — O10912 Unspecified pre-existing hypertension complicating pregnancy, second trimester: Secondary | ICD-10-CM | POA: Diagnosis present

## 2018-03-09 DIAGNOSIS — O26892 Other specified pregnancy related conditions, second trimester: Secondary | ICD-10-CM | POA: Diagnosis present

## 2018-03-09 DIAGNOSIS — O99612 Diseases of the digestive system complicating pregnancy, second trimester: Secondary | ICD-10-CM | POA: Diagnosis present

## 2018-03-09 DIAGNOSIS — O24112 Pre-existing diabetes mellitus, type 2, in pregnancy, second trimester: Secondary | ICD-10-CM

## 2018-03-09 DIAGNOSIS — O42912 Preterm premature rupture of membranes, unspecified as to length of time between rupture and onset of labor, second trimester: Principal | ICD-10-CM

## 2018-03-09 DIAGNOSIS — O10012 Pre-existing essential hypertension complicating pregnancy, second trimester: Secondary | ICD-10-CM | POA: Insufficient documentation

## 2018-03-09 DIAGNOSIS — O0992 Supervision of high risk pregnancy, unspecified, second trimester: Secondary | ICD-10-CM | POA: Diagnosis not present

## 2018-03-09 DIAGNOSIS — Z363 Encounter for antenatal screening for malformations: Secondary | ICD-10-CM | POA: Insufficient documentation

## 2018-03-09 DIAGNOSIS — Z7982 Long term (current) use of aspirin: Secondary | ICD-10-CM

## 2018-03-09 DIAGNOSIS — Z794 Long term (current) use of insulin: Secondary | ICD-10-CM | POA: Diagnosis not present

## 2018-03-09 DIAGNOSIS — E119 Type 2 diabetes mellitus without complications: Secondary | ICD-10-CM | POA: Diagnosis present

## 2018-03-09 DIAGNOSIS — G4730 Sleep apnea, unspecified: Secondary | ICD-10-CM | POA: Diagnosis present

## 2018-03-09 DIAGNOSIS — Z91013 Allergy to seafood: Secondary | ICD-10-CM

## 2018-03-09 DIAGNOSIS — K219 Gastro-esophageal reflux disease without esophagitis: Secondary | ICD-10-CM | POA: Diagnosis present

## 2018-03-09 DIAGNOSIS — O4102X9 Oligohydramnios, second trimester, other fetus: Secondary | ICD-10-CM | POA: Diagnosis present

## 2018-03-09 DIAGNOSIS — Z3A19 19 weeks gestation of pregnancy: Secondary | ICD-10-CM | POA: Insufficient documentation

## 2018-03-09 DIAGNOSIS — O0990 Supervision of high risk pregnancy, unspecified, unspecified trimester: Secondary | ICD-10-CM

## 2018-03-09 LAB — CBC WITH DIFFERENTIAL/PLATELET
Basophils Absolute: 0 10*3/uL (ref 0.0–0.1)
Basophils Relative: 0 %
Eosinophils Absolute: 0 10*3/uL (ref 0.0–0.5)
Eosinophils Relative: 1 %
HEMATOCRIT: 32.7 % — AB (ref 36.0–46.0)
Hemoglobin: 11 g/dL — ABNORMAL LOW (ref 12.0–15.0)
LYMPHS ABS: 1.5 10*3/uL (ref 0.7–4.0)
LYMPHS PCT: 25 %
MCH: 27.9 pg (ref 26.0–34.0)
MCHC: 33.6 g/dL (ref 30.0–36.0)
MCV: 83 fL (ref 80.0–100.0)
MONO ABS: 0.3 10*3/uL (ref 0.1–1.0)
Monocytes Relative: 4 %
NEUTROS ABS: 4.1 10*3/uL (ref 1.7–7.7)
Neutrophils Relative %: 70 %
Platelets: 333 10*3/uL (ref 150–400)
RBC: 3.94 MIL/uL (ref 3.87–5.11)
RDW: 13.8 % (ref 11.5–15.5)
WBC: 5.9 10*3/uL (ref 4.0–10.5)
nRBC: 0 % (ref 0.0–0.2)

## 2018-03-09 LAB — WET PREP, GENITAL
CLUE CELLS WET PREP: NONE SEEN
Sperm: NONE SEEN
Trich, Wet Prep: NONE SEEN
YEAST WET PREP: NONE SEEN

## 2018-03-09 LAB — OB RESULTS CONSOLE GC/CHLAMYDIA: Gonorrhea: NEGATIVE

## 2018-03-09 LAB — OB RESULTS CONSOLE GBS: GBS: NEGATIVE

## 2018-03-09 MED ORDER — INSULIN GLARGINE 100 UNIT/ML ~~LOC~~ SOLN
10.0000 [IU] | Freq: Every day | SUBCUTANEOUS | Status: DC
  Filled 2018-03-09: qty 0.1

## 2018-03-09 MED ORDER — ZOLPIDEM TARTRATE 5 MG PO TABS: 5.0000 mg | ORAL_TABLET | Freq: Every evening | ORAL | Status: DC | PRN

## 2018-03-09 MED ORDER — ONDANSETRON HCL 4 MG PO TABS: 4.0000 mg | ORAL_TABLET | Freq: Four times a day (QID) | ORAL | Status: DC | PRN

## 2018-03-09 MED ORDER — HYDROMORPHONE HCL 1 MG/ML IJ SOLN: 1.0000 mg | INTRAMUSCULAR | Status: DC | PRN

## 2018-03-09 MED ORDER — ONDANSETRON HCL 4 MG/2ML IJ SOLN: 4.0000 mg | Freq: Four times a day (QID) | INTRAMUSCULAR | Status: DC | PRN

## 2018-03-09 MED ORDER — MAGNESIUM HYDROXIDE 400 MG/5ML PO SUSP
30.0000 mL | Freq: Every day | ORAL | Status: DC | PRN
  Filled 2018-03-09: qty 30

## 2018-03-09 MED ORDER — DOCUSATE SODIUM 100 MG PO CAPS
100.0000 mg | ORAL_CAPSULE | Freq: Two times a day (BID) | ORAL | Status: DC
  Filled 2018-03-09 (×2): qty 1

## 2018-03-09 MED ORDER — MAGNESIUM CITRATE PO SOLN: 1.0000 | Freq: Once | ORAL | Status: DC | PRN

## 2018-03-09 MED ORDER — METFORMIN HCL 500 MG PO TABS
500.0000 mg | ORAL_TABLET | Freq: Two times a day (BID) | ORAL | Status: DC
  Filled 2018-03-09 (×2): qty 1

## 2018-03-09 MED ORDER — OXYCODONE-ACETAMINOPHEN 5-325 MG PO TABS: 1.0000 | ORAL_TABLET | ORAL | Status: DC | PRN

## 2018-03-09 MED ORDER — ALUM & MAG HYDROXIDE-SIMETH 200-200-20 MG/5ML PO SUSP: 30.0000 mL | ORAL | Status: DC | PRN

## 2018-03-09 MED ORDER — MISOPROSTOL 200 MCG PO TABS: 400.0000 ug | ORAL_TABLET | ORAL | Status: DC

## 2018-03-09 MED ORDER — BISACODYL 5 MG PO TBEC: 5.0000 mg | DELAYED_RELEASE_TABLET | Freq: Every day | ORAL | Status: DC | PRN

## 2018-03-09 MED ORDER — LACTATED RINGERS IV SOLN: INTRAVENOUS | Status: DC

## 2018-03-09 MED ORDER — IBUPROFEN 600 MG PO TABS: 600.0000 mg | ORAL_TABLET | Freq: Four times a day (QID) | ORAL | Status: DC | PRN

## 2018-03-09 MED ORDER — MISOPROSTOL 200 MCG PO TABS: 800.0000 ug | ORAL_TABLET | Freq: Once | ORAL | Status: DC

## 2018-03-09 NOTE — Progress Notes (Signed)
Patient ID: Kathryn Shelton, female   DOB: 1990-07-27, 27 y.o.   MRN: [id number]  Shortly after admission patient's family returned to [facility]. After meeting with family and [facility] chaplain patient has elected to cancel IOL and follow the expectant management plan of care.  See my H&P for ROS and physical assessment information  [facility] messaged to call patient to schedule weekly follow-up as advised by Dr. Perry Mount  Discharge home in stable condition with strict precautions for weekly follow-up and daily monitoring for signs of worsening acuity including but not limited to fever, abdominal tenderness, vaginal bleeding, foul-smelling vaginal discharge, or general feeling of malaise.  Clayton Bibles, CNM 03/09/18  6:23 PM     Attestation of Attending Supervision of Advanced Practice Provider (PA/CNM/NP): Evaluation and management procedures were performed by the Advanced Practice Provider under my supervision and collaboration.  I have reviewed the Advanced Practice Provider's note and chart, and I agree with the management and plan.  Jaynie Collins, MD, FACOG Obstetrician & Gynecologist, [facility] for [employer], [facility]

## 2018-03-09 NOTE — MAU Provider Note (Signed)
Kathryn Shelton is a 27 y.o.G3P2002 at [redacted]w[redacted]d presenting after diagnosis of anhydramnios  Reports leaking of fluid x 2 weeks. Sent from MFM today after scan and has received counseling by Dr. Perry Mount.  Patient received prenatal care at [facility]. Pregnancy is complicated by the following:  Patient Active Problem List   Diagnosis Date Noted  . Oligohydramnios antepartum, second trimester, other fetus 03/09/2018  . Ruptured, membranes, premature 03/09/2018  . Obesity, morbid (HCC) 03/09/2018  . Preterm premature rupture of membranes, unspecified as to length of time between rupture and onset of labor, second trimester 03/09/2018  . Nausea and vomiting during pregnancy prior to [redacted] weeks gestation 01/14/2018  . Bacterial vaginitis 01/02/2018  . Supervision of high risk pregnancy, antepartum 12/31/2017  . Chronic hypertension during pregnancy, antepartum 12/31/2017  . Pre-existing type 2 diabetes mellitus during pregnancy, antepartum 12/31/2017   OB History  Gravida Para Term Preterm AB Living  3 2 2  0 0 2  SAB TAB Ectopic Multiple Live Births  0 0 0 0 2    # Outcome Date GA Lbr Len/2nd Weight Sex Delivery Anes PTL Lv  3 Current           2 Term 12/11/11    F Vag-Spont  N LIV  1 Term 09/04/10    F Vag-Spont   LIV   Past Medical History:  Diagnosis Date  . DM (diabetes mellitus), type 2 (HCC)   . GERD (gastroesophageal reflux disease)   . Hypertension   . Sleep apnea    History reviewed. No pertinent surgical history.  No current facility-administered medications on file prior to encounter.    Current Outpatient Medications on File Prior to Encounter  Medication Sig Dispense Refill  . aspirin EC 81 MG tablet Take 1 tablet (81 mg total) by mouth daily. Take after 12 weeks for prevention of preeclampsia later in pregnancy 300 tablet 2  . Insulin Glargine (LANTUS) 100 UNIT/ML Solostar Pen Inject 10 Units into the skin daily at 10 pm. 15 mL 11  . Prenatal MV & Min w/FA-DHA (PRENATAL  ADULT GUMMY/DHA/FA) 0.4-25 MG CHEW Chew 1 tablet by mouth daily. 30 tablet 12  . Doxylamine-Pyridoxine 10-10 MG TBEC Take 2 tablets by mouth at bedtime. (Patient not taking: Reported on 12/31/2017) 60 tablet 0  . glycopyrrolate (ROBINUL) 1 MG tablet Take 1 tablet (1 mg total) by mouth 2 (two) times daily. (Patient not taking: Reported on 01/28/2018) 60 tablet 1  . metFORMIN (GLUCOPHAGE) 500 MG tablet Take 1 tablet (500 mg total) by mouth 2 (two) times daily with a meal. (Patient not taking: Reported on 01/28/2018) 60 tablet 0  . metoCLOPramide (REGLAN) 10 MG tablet Take 1 tablet (10 mg total) by mouth every 6 (six) hours. (Patient not taking: Reported on 12/22/2017) 120 tablet 1  . metoCLOPramide (REGLAN) 10 MG tablet Take 1 tablet (10 mg total) by mouth 3 (three) times daily before meals. (Patient not taking: Reported on 01/28/2018) 90 tablet 1  . ondansetron (ZOFRAN ODT) 8 MG disintegrating tablet Take 1 tablet (8 mg total) by mouth every 8 (eight) hours as needed for nausea or vomiting. (Patient not taking: Reported on 01/15/2018) 20 tablet 0  . ondansetron (ZOFRAN) 8 MG tablet Take 1 tablet (8 mg total) by mouth every 6 (six) hours. (Patient not taking: Reported on 01/28/2018) 20 tablet 0  . promethazine (PHENERGAN) 25 MG tablet Take 1 tablet (25 mg total) by mouth at bedtime as needed for nausea or vomiting. (Patient not taking:  Reported on 01/15/2018) 30 tablet 0  . ranitidine (ZANTAC) 150 MG tablet Take 1 tablet (150 mg total) by mouth 2 (two) times daily. (Patient not taking: Reported on 02/25/2018) 60 tablet 2   Allergies  Allergen Reactions  . Shellfish-Derived Products Swelling    Review of Systems  Constitutional: Negative for chills and fever.  Gastrointestinal: Negative for abdominal pain.  Genitourinary: Negative for dysuria, frequency and urgency.  Musculoskeletal: Negative for back pain.  Neurological: Negative for headaches.  All other systems reviewed and are negative.    Physical Exam   Blood pressure 107/77, pulse 81, temperature 98.1 F (36.7 C), temperature source Oral, resp. rate 16, weight 94.8 kg, last menstrual period 10/07/2017. Nursing note and vitals reviewed. Constitutional: She is oriented to person, place, and time. She appears well-developed and well-nourished.  Cardiovascular: Normal rate, normal heart sounds and intact distal pulses.  Respiratory: Effort normal and breath sounds normal.  GI: She exhibits no distension. There is no tenderness. There is no rebound and no guarding.  Gravid  Genitourinary: Vaginal discharge found.  Genitourinary Comments: Thin white vaginal discharge visible on specimen collection  Musculoskeletal: Normal range of motion.  Neurological: She is alert and oriented to person, place, and time. She has normal reflexes.  Skin: Skin is warm and dry.  Psychiatric: She has a normal mood and affect. Her behavior is normal. Judgment and thought content normal.    Prenatal labs: ABO, Rh: A/Positive/-- (10/03 1708) Antibody: Negative (10/03 1708) Rubella: 1.50 (10/03 1708) RPR: Non Reactive (10/03 1708)  HBsAg: Negative (10/03 1708)  HIV: Non Reactive (10/03 1708)  GBS:    [place] Mfm Ob Detail +14 Wk  Result Date: 03/09/2018 ----------------------------------------------------------------------  OBSTETRICS REPORT                       (Signed Final 03/09/2018 03:10 pm) ---------------------------------------------------------------------- Patient Info  ID #:       [id number]                          D.O.B.:  March 09, 1991 (27 yrs)  Name:       Kathryn Shelton                  Visit Date: 03/09/2018 01:37 pm ---------------------------------------------------------------------- Performed By  Performed By:     Marcellina Millin          Ref. Address:     Faculty                    RDMS  Attending:        Patsi Sears      Location:         [facility]                    MD  Referred By:      Catalina Antigua MD  ---------------------------------------------------------------------- Orders   #  Description                          Code         Ordered By   1  [place] MFM OB DETAIL +14 WK              [id number]     PEGGY CONSTANT  ----------------------------------------------------------------------   #  Order #                    Accession #                 Episode #   1  [id number]                  [id number]                  [id number]  ---------------------------------------------------------------------- Indications   Encounter for antenatal screening for          Z36.3   malformations (low risk panorama)   [redacted] weeks gestation of pregnancy                Z3A.19   Hypertension - Chronic/Pre-existing (no        O10.019   meds)   Pre-existing diabetes, type 2, in pregnancy,   O24.112   second trimester (metformin, insulin, not   currently taking either)   Oligohydraminios, second trimester,            O41.02X0   unspecified (unknown until today)  ---------------------------------------------------------------------- Fetal Evaluation  Num Of Fetuses:         1  Fetal Heart Rate(bpm):  150  Cardiac Activity:       Observed  Presentation:           Cephalic  Placenta:               Anterior  P. Cord Insertion:      Visualized  Amniotic Fluid  AFI FV:      Anhydramnios ---------------------------------------------------------------------- Biometry  BPD:        40  mm     G. Age:  18w 1d         10  %    CI:        70.17   %    70 - 86                                                          FL/HC:      19.1   %    16.1 - 18.3  HC:      152.3  mm     G. Age:  18w 2d          7  %    HC/AC:      1.00        1.09 - 1.39  AC:      152.9  mm     G. Age:  20w 3d         81  %    FL/BPD:     72.8   %  FL:       29.1  mm     G. Age:  19w 0d         32  %    FL/AC:      19.0   %    20 - 24  HUM:      27.4  mm     G. Age:  18w 5d         37  %  Est. FW:     300  gm    0 lb 11 oz  51  %  ---------------------------------------------------------------------- OB History  Gravidity:    3         Term:   2        Prem:   0        SAB:   0  TOP:          0       Ectopic:  0        Living: 2 ---------------------------------------------------------------------- Gestational Age  LMP:           21w 6d        Date:  10/07/17                 EDD:   07/14/18  U/S Today:     19w 0d                                        EDD:   08/03/18  Best:          19w 2d     Det. By:  Marcella Dubs         EDD:   08/01/18                                      (12/22/17) ---------------------------------------------------------------------- Anatomy  Cranium:               Appears normal         Aortic Arch:            Appears normal  Cavum:                 Appears normal         Ductal Arch:            Appears normal  Ventricles:            Appears normal         Diaphragm:              Appears normal  Choroid Plexus:        Appears normal         Stomach:                Appears normal, left                                                                        sided  Cerebellum:            Appears normal         Abdomen:                Appears normal  Posterior Fossa:       Appears normal         Abdominal Wall:         Not well visualized  Nuchal Fold:           Appears normal         Cord Vessels:           Appears normal (3  vessel cord)  Face:                  Orbits nl; profile not Kidneys:                Appear normal                         well visualized  Lips:                  Not well visualized    Bladder:                Appears normal  Thoracic:              Appears normal         Spine:                  Not well visualized  Heart:                 Appears normal         Upper Extremities:      Appears normal,                         (4CH, axis, and situs                          hands nws  RVOT:                  Appears normal         Lower  Extremities:      Appears normal,                                                                        feet nws  LVOT:                  Appears normal  Other:  Technically difficult due to low amniotic fluid. ---------------------------------------------------------------------- Doppler - Fetal Vessels  Umbilical Artery   S/D     %tile                                            ADFV    RDFV  4.93       69                                                No      No ---------------------------------------------------------------------- Cervix Uterus Adnexa  Cervix  Length:              3  cm.  Normal appearance by transabdominal scan. ---------------------------------------------------------------------- Comments  Kathryn Shelton is a 27 yo African American female, G 3 P  2002 LMP 0/38/20 EDC 07/14/18 by U/S now 19 2/7 weeks  seen in consultation as requested secondary to:  1) Anhydramnios that was found on U/S today.  Patient  reports a 2  week history of leaking clear fluid.  She denies  fever, chills, or contractions  PREVIOUS OBSTETRICAL HISTORY:  1) 2011 - NSVD @ term, female, ? weight, without  complications 2) 2012 - NSVD @ term, female, ? weight,  without complications   PREVIOUS GYN HISTORY:  Abnormal PAP - neg           GC - neg          Chlamydia -  neg    Syphilis - neg    CAT - 12 x irreg x 7       Contraception -  none  PREVIOUS MEDICAL HISTORY:  DM - childhood      HTN - ? date of diagnosis  Asthma - neg  neg        Thyroid - neg     Rheumatic Fever - neg    Heart - neg       Lung - neg        Liver - neg  Kidney - neg        Epilepsy - neg    TB - neg          Herpes - neg      UTI - neg  PREVIOUS SURGICAL HISTORY:  None  MEDICATIONS:  Prenatal Vitamins, Lantus 14 units Q AM  ALLERGIES/REACTIONS:  None  HABITS:  Smoking - neg       Drinking - neg    Drugs - neg  PSYCHOSOCIAL:  Separated; FOB is not involved  PROFESSION:  Homemaker  FAMILY HISTORY:  Adopted  ---------------------------------------------------------------------- Impression  OLIGOHYDRAMNIOS:  General counseling was then performed regarding  oligohydramnios.  SROM, congenital renal agenesis, urinary  tract obstruction, IUGR, placental insufficiency, chromosomal  abnormalities and other congenital malformations were  discussed.  Marked oligohydramnios greater than 5 weeks  occurring before 25 weeks has been associated with a 27%  risk of hypoplastic fetal lungs.  The limitations of U/S in  making this diagnosis was stressed.   Increased perinatal  morbidity and mortality is present.  SROM:  General counseling was performed regarding spontaneous  rupture of membranes.  The increased risks of infection,  intrauterine fetal demise, pulmonary hypoplasia and preterm  labor/delivery were discussed.  The role of Celestone @ 24  weeks, GBS prophylaxis and short term tocolysis was  discussed.  Several investigators have added IV  Erythromycin in an attempt to prolong the latency period.  An  alternative to daily E-mycin is PO Azithromycin 1 gram orally  which is repeated 5 days later (total 2 doses) Close A-P  surveillance is recommended (BIW BPP) and serial U/S  every 3-4 weeks for growth.   Delivery @ 34 weeks is  recommended.  Outcomes may be better than originally thought as reflected  by a recent study of conservative management of PROM  prior to 24 weeks (excluded were patients who delivered  within 12 hours of PROM, multiple gestation, anomalous  fetuses and total number of patients -159- were low), indicate  an overall survival of 56% of which 48% had no major  neonatal morbidities (27% of all neonates).  [place], TA,  Roberta, AG, [place], MS, Ellis Savage, MW, Silver, RM.  Outcomes of Expectantly Managed Premature Rupture of  Membranes Occurring Before 24 weeks of Gestation.  Obstet  Gynecol 2009;114:29-37. ---------------------------------------------------------------------- Recommendations  1) To  MAU for SROM confirmation and observation 2)  Outpatient management with weekly OB and MFM follow-up  until 24 weeks 3) Admission @  24 weeks for Celestone,  MgSO4 for fetal neuro-prophylaxis, serial U/S for growth, A-P  surveillance and delivery @ 34 weeks 4) 17-OH  Progesterone Caproate weekly from 16-36 weeks for all  subsequent pregnancies   60 minutes spent in face-to-face consultation with greater  than 50% of the time spent in counseling. ----------------------------------------------------------------------               Patsi Sears, MD Electronically Signed Final Report   03/09/2018 03:10 pm ----------------------------------------------------------------------   Assessment/Plan: Patient s/p discussion of management options in MAU and with MFM immediately following [place] this afternoon. Patient requests expectant management for now; had originally decided to proceed with IOL but now wants expectant management. Dr. Macon Large informed of patient's choice.  Calvert Cantor, CNM 03/09/2018, 5:37 PM    Attestation of Attending Supervision of Advanced Practice Provider (PA/CNM/NP): Evaluation and management procedures were performed by the Advanced Practice Provider under my supervision and collaboration.  I have reviewed the Advanced Practice Provider's note and chart, and I agree with the management and plan.  Patient is aware of risks of increased risks of infection (chorioamnionitis, endometritis), abruptio placentae, cord prolapse, fetal death, preterm birth, and need for cesarean delivery. Also emphasized that the neonate is at risk for morbidity/mortality related to preterm birth, infection, pulmonary hypoplasia, and musculoskeletal deformation.  She was told that if she makes it to 22 5/7 weeks (this is the limit used at this institution), she will be admitted for inpatient observation, steroids and antibiotics; none of that is indicated for now.  She was told to call for any  signs/symptoms of infection or bleeding; chorioamnionitis or abruption are indications for delivery. She was discharged to home; will follow up in clinic next week as recommended.  Will follow Dr. Judithe Modest recommendations as listed in ultrasound report for outpatient management of previable PPROM, appreciate his help and expertise.   Jaynie Collins, MD, FACOG Obstetrician & Gynecologist, [facility] for [employer], [facility]

## 2018-03-09 NOTE — H&P (Deleted)
Kathryn Shelton is a 27 y.o. female presenting after diagnosis of anhydramnios at [redacted]w[redacted]d.  Reports leaking of fluid x 2 weeks. Sent from MFM today after scan and has received counseling by Dr. Perry Mount.  Patient received prenatal care at [facility]. Pregnancy is complicated by the following:  Patient Active Problem List   Diagnosis Date Noted  . Oligohydramnios antepartum, second trimester, other fetus 03/09/2018  . Ruptured, membranes, premature 03/09/2018  . Obesity, morbid (HCC) 03/09/2018  . Preterm premature rupture of membranes, unspecified as to length of time between rupture and onset of labor, second trimester 03/09/2018  . Nausea and vomiting during pregnancy prior to [redacted] weeks gestation 01/14/2018  . Bacterial vaginitis 01/02/2018  . Supervision of high risk pregnancy, antepartum 12/31/2017  . Chronic hypertension during pregnancy, antepartum 12/31/2017  . Pre-existing type 2 diabetes mellitus during pregnancy, antepartum 12/31/2017     OB History    Gravida  3   Para  2   Term  2   Preterm  0   AB  0   Living  2     SAB  0   TAB  0   Ectopic  0   Multiple  0   Live Births  2          Past Medical History:  Diagnosis Date  . DM (diabetes mellitus), type 2 (HCC)   . GERD (gastroesophageal reflux disease)   . Hypertension   . Sleep apnea    History reviewed. No pertinent surgical history. Family History: She was adopted. Family history is unknown by patient. Social History:  reports that she has never smoked. She has never used smokeless tobacco. She reports that she does not drink alcohol or use drugs.    Maternal Diabetes: Yes:  Diabetes Type:  Insulin/Medication controlled Pt consistently reports she is not taking prescribed Insulin Genetic Screening: Declined Maternal Ultrasounds/Referrals: Abnormal:  Findings:   Other: Fetal Ultrasounds or other Referrals:  Referred to Materal Fetal Medicine  for Anhydramnios Maternal Substance Abuse:   No Significant Maternal Medications:  None Significant Maternal Lab Results:  None Other Comments:  None  Review of Systems  Constitutional: Negative for chills and fever.  Gastrointestinal: Negative for abdominal pain.  Genitourinary: Negative for dysuria, frequency and urgency.  Musculoskeletal: Negative for back pain.  Neurological: Negative for headaches.  All other systems reviewed and are negative.  History   Blood pressure 120/78, pulse 82, temperature 98.2 F (36.8 C), resp. rate 16, weight 94.8 kg, last menstrual period 10/07/2017. Maternal Exam:  Introitus: Vagina is positive for vaginal discharge.    Physical Exam  Nursing note and vitals reviewed. Constitutional: She is oriented to person, place, and time. She appears well-developed and well-nourished.  Cardiovascular: Normal rate, normal heart sounds and intact distal pulses.  Respiratory: Effort normal and breath sounds normal.  GI: She exhibits no distension. There is no tenderness. There is no rebound and no guarding.  Gravid  Genitourinary: Vaginal discharge found.  Genitourinary Comments: Thin white vaginal discharge visible on specimen collection  Musculoskeletal: Normal range of motion.  Neurological: She is alert and oriented to person, place, and time. She has normal reflexes.  Skin: Skin is warm and dry.  Psychiatric: She has a normal mood and affect. Her behavior is normal. Judgment and thought content normal.    Prenatal labs: ABO, Rh: A/Positive/-- (10/03 1708) Antibody: Negative (10/03 1708) Rubella: 1.50 (10/03 1708) RPR: Non Reactive (10/03 1708)  HBsAg: Negative (10/03 1708)  HIV: Non  Reactive (10/03 1708)  GBS:     [place] Mfm Ob Detail +14 Wk  Result Date: 03/09/2018 ----------------------------------------------------------------------  OBSTETRICS REPORT                       (Signed Final 03/09/2018 03:10 pm) ---------------------------------------------------------------------- Patient Info   ID #:       [id number]                          D.O.B.:  1990/06/03 (27 yrs)  Name:       Kathryn Shelton                  Visit Date: 03/09/2018 01:37 pm ---------------------------------------------------------------------- Performed By  Performed By:     Marcellina Millin          Ref. Address:     Faculty                    RDMS  Attending:        Patsi Sears      Location:         [facility]                    MD  Referred By:      Catalina Antigua MD ---------------------------------------------------------------------- Orders   #  Description                          Code         Ordered By   1  [place] MFM OB DETAIL +14 WK              76811.01     PEGGY CONSTANT  ----------------------------------------------------------------------   #  Order #                    Accession #                 Episode #   1  [id number]                  [id number]                  [id number]  ---------------------------------------------------------------------- Indications   Encounter for antenatal screening for          Z36.3   malformations (low risk panorama)   [redacted] weeks gestation of pregnancy                Z3A.19   Hypertension - Chronic/Pre-existing (no        O10.019   meds)   Pre-existing diabetes, type 2, in pregnancy,   O24.112   second trimester (metformin, insulin, not   currently taking either)   Oligohydraminios, second trimester,            O41.02X0   unspecified (unknown until today)  ---------------------------------------------------------------------- Fetal Evaluation  Num Of Fetuses:         1  Fetal Heart Rate(bpm):  150  Cardiac Activity:       Observed  Presentation:           Cephalic  Placenta:               Anterior  P. Cord Insertion:      Visualized  Amniotic Fluid  AFI FV:      Anhydramnios ---------------------------------------------------------------------- Biometry  BPD:        40  mm     G. Age:  18w 1d         10  %    CI:        70.17   %    70 - 86                                                           FL/HC:      19.1   %    16.1 - 18.3  HC:      152.3  mm     G. Age:  18w 2d          7  %    HC/AC:      1.00        1.09 - 1.39  AC:      152.9  mm     G. Age:  20w 3d         81  %    FL/BPD:     72.8   %  FL:       29.1  mm     G. Age:  19w 0d         32  %    FL/AC:      19.0   %    20 - 24  HUM:      27.4  mm     G. Age:  18w 5d         37  %  Est. FW:     300  gm    0 lb 11 oz      51  % ---------------------------------------------------------------------- OB History  Gravidity:    3         Term:   2        Prem:   0        SAB:   0  TOP:          0       Ectopic:  0        Living: 2 ---------------------------------------------------------------------- Gestational Age  LMP:           21w 6d        Date:  10/07/17                 EDD:   07/14/18  U/S Today:     19w 0d                                        EDD:   08/03/18  Best:          19w 2d     Det. ByMarcella Dubs         EDD:   08/01/18                                      (12/22/17) ---------------------------------------------------------------------- Anatomy  Cranium:  Appears normal         Aortic Arch:            Appears normal  Cavum:                 Appears normal         Ductal Arch:            Appears normal  Ventricles:            Appears normal         Diaphragm:              Appears normal  Choroid Plexus:        Appears normal         Stomach:                Appears normal, left                                                                        sided  Cerebellum:            Appears normal         Abdomen:                Appears normal  Posterior Fossa:       Appears normal         Abdominal Wall:         Not well visualized  Nuchal Fold:           Appears normal         Cord Vessels:           Appears normal (3                                                                        vessel cord)  Face:                  Orbits nl; profile not Kidneys:                Appear normal                          well visualized  Lips:                  Not well visualized    Bladder:                Appears normal  Thoracic:              Appears normal         Spine:                  Not well visualized  Heart:                 Appears normal         Upper Extremities:      Appears normal,                         (  4CH, axis, and situs                          hands nws  RVOT:                  Appears normal         Lower Extremities:      Appears normal,                                                                        feet nws  LVOT:                  Appears normal  Other:  Technically difficult due to low amniotic fluid. ---------------------------------------------------------------------- Doppler - Fetal Vessels  Umbilical Artery   S/D     %tile                                            ADFV    RDFV  4.93       69                                                No      No ---------------------------------------------------------------------- Cervix Uterus Adnexa  Cervix  Length:              3  cm.  Normal appearance by transabdominal scan. ---------------------------------------------------------------------- Comments  Arielis Leonhart is a 27 yo African American female, G 3 P  2002 LMP 0/38/20 EDC 07/14/18 by U/S now 19 2/7 weeks  seen in consultation as requested secondary to:  1) Anhydramnios that was found on U/S today.  Patient  reports a 2 week history of leaking clear fluid.  She denies  fever, chills, or contractions  PREVIOUS OBSTETRICAL HISTORY:  1) 2011 - NSVD @ term, female, ? weight, without  complications 2) 2012 - NSVD @ term, female, ? weight,  without complications   PREVIOUS GYN HISTORY:  Abnormal PAP - neg           GC - neg          Chlamydia -  neg    Syphilis - neg    CAT - 12 x irreg x 7       Contraception -  none  PREVIOUS MEDICAL HISTORY:  DM - childhood      HTN - ? date of diagnosis  Asthma - neg  neg        Thyroid - neg     Rheumatic Fever - neg    Heart - neg       Lung - neg         Liver - neg  Kidney - neg        Epilepsy - neg    TB - neg          Herpes - neg      UTI - neg  PREVIOUS SURGICAL HISTORY:  None  MEDICATIONS:  Prenatal Vitamins, Lantus 14 units Q AM  ALLERGIES/REACTIONS:  None  HABITS:  Smoking - neg       Drinking - neg    Drugs - neg  PSYCHOSOCIAL:  Separated; FOB is not involved  PROFESSION:  Homemaker  FAMILY HISTORY:  Adopted ---------------------------------------------------------------------- Impression  OLIGOHYDRAMNIOS:  General counseling was then performed regarding  oligohydramnios.  SROM, congenital renal agenesis, urinary  tract obstruction, IUGR, placental insufficiency, chromosomal  abnormalities and other congenital malformations were  discussed.  Marked oligohydramnios greater than 5 weeks  occurring before 25 weeks has been associated with a 27%  risk of hypoplastic fetal lungs.  The limitations of U/S in  making this diagnosis was stressed.   Increased perinatal  morbidity and mortality is present.  SROM:  General counseling was performed regarding spontaneous  rupture of membranes.  The increased risks of infection,  intrauterine fetal demise, pulmonary hypoplasia and preterm  labor/delivery were discussed.  The role of Celestone @ 24  weeks, GBS prophylaxis and short term tocolysis was  discussed.  Several investigators have added IV  Erythromycin in an attempt to prolong the latency period.  An  alternative to daily E-mycin is PO Azithromycin 1 gram orally  which is repeated 5 days later (total 2 doses) Close A-P  surveillance is recommended (BIW BPP) and serial U/S  every 3-4 weeks for growth.   Delivery @ 34 weeks is  recommended.  Outcomes may be better than originally thought as reflected  by a recent study of conservative management of PROM  prior to 24 weeks (excluded were patients who delivered  within 12 hours of PROM, multiple gestation, anomalous  fetuses and total number of patients -159- were low), indicate  an overall survival of 56% of  which 48% had no major  neonatal morbidities (27% of all neonates).  [place], TA,  [place], AG, [place], MS, Ellis Savage, MW, Silver, RM.  Outcomes of Expectantly Managed Premature Rupture of  Membranes Occurring Before 24 weeks of Gestation.  Obstet  Gynecol 2009;114:29-37. ---------------------------------------------------------------------- Recommendations  1) To MAU for SROM confirmation and observation 2)  Outpatient management with weekly OB and MFM follow-up  until 24 weeks 3) Admission @ 24 weeks for Celestone,  MgSO4 for fetal neuro-prophylaxis, serial U/S for growth, A-P  surveillance and delivery @ 34 weeks 4) 17-OH  Progesterone Caproate weekly from 16-36 weeks for all  subsequent pregnancies   60 minutes spent in face-to-face consultation with greater  than 50% of the time spent in counseling. ----------------------------------------------------------------------               Patsi Sears, MD Electronically Signed Final Report   03/09/2018 03:10 pm ----------------------------------------------------------------------   Assessment/Plan: Patient s/p discussion of management options in MAU and with MFM immediately following [place] this afternoon. Patient requests expectant management for now; had originally decided to proceed with IOL but now wants expectant management.  Re-discussed risks of increased risks of infection (chorioamnionitis, endometritis), abruptio placentae, cord prolapse, fetal death, preterm birth, and need for cesarean delivery. Also emphasized that the neonate is at risk for morbidity/mortality related to preterm birth, infection, pulmonary hypoplasia, and musculoskeletal deformation.  She was told that if she makes it to 22 5/7 weeks (this is the limit used as this institution), she will be admitted for inpatient observation, steroids and antibiotics; none of that is indicated for now.  She was told to call for any signs/symptoms of infection or bleeding;  chorioamnionitis or abruption are indications for  delivery. She was discharged to home; will follow up in clinic next week as recommended.  Will follow Dr. Judithe Modest recommendations as listed in ultrasound report for outpatient management of previable PPROM, appreciate his help and expertise.  Calvert Cantor, CNM 03/09/2018, 5:37 PM   Attestation of Attending Supervision of Advanced Practice Provider (PA/CNM/NP): Evaluation and management procedures were performed by the Advanced Practice Provider under my supervision and collaboration.  I have reviewed the Advanced Practice Provider's note and chart, and I agree with the management and plan.  Jaynie Collins, MD, FACOG Obstetrician & Gynecologist, [facility] for [employer], [facility]

## 2018-03-09 NOTE — Consult Note (Signed)
Consultation:   Kathryn Shelton is a 27 yo African American female, G 3 P 2002 LMP 0/38/20 EDC 07/14/18 by U/S now 19 2/7 weeks seen in consultation as requested secondary to:  1) Anhydramnios that was found on U/S today.  Patient reports a 2 week history of leaking clear fluid.  She denies fever, chills, or contractions    PREVIOUS OBSTETRICAL HISTORY:  1) 2011 - NSVD @ term, female, ? weight, without complications 2) 2012 - NSVD @ term, female, ? weight, without complications     PREVIOUS GYN HISTORY:   Abnormal PAP - neg   GC - neg   Chlamydia - neg    Syphilis - neg   CAT - 12 x irreg x 7   Contraception - none    PREVIOUS MEDICAL HISTORY:  DM - childhood   HTN - ? date of diagnosis   Asthma - neg   Thyroid - neg   Rheumatic Fever - neg    Heart - neg   Lung - neg   Liver - neg   Kidney - neg   Epilepsy - neg    TB - neg   Herpes - neg   UTI - neg        PREVIOUS SURGICAL HISTORY:  None    MEDICATIONS:  Prenatal Vitamins, Lantus 14 units Q AM       ALLERGIES/REACTIONS:  None      HABITS:  Smoking - neg   Drinking - neg   Drugs - neg    PSYCHOSOCIAL:  Separated; FOB is not involved      PROFESSION:  Homemaker    FAMILY HISTORY:  Adopted   OLIGOHYDRAMNIOS:  General counseling was then performed regarding oligohydramnios.  SROM, congenital renal agenesis, urinary tract obstruction, IUGR, placental insufficiency, chromosomal abnormalities and other congenital malformations were discussed.  Marked oligohydramnios greater than 5 weeks occurring before 25 weeks has been associated with a 27% risk of hypoplastic fetal lungs.  The limitations of U/S in making this diagnosis was stressed.   Increased perinatal morbidity and mortality is present.    SROM: General counseling was performed regarding spontaneous rupture of membranes.  The increased risks of infection, intrauterine fetal demise, pulmonary hypoplasia and preterm labor/delivery were discussed.  The  role of Celestone @ 24 weeks, GBS prophylaxis and short term tocolysis was discussed.  Several investigators have added IV Erythromycin in an attempt to prolong the latency period.  An alternative to daily E-mycin is PO Azithromycin 1 gram orally which is repeated 5 days later (total 2 doses) Close A-P surveillance is recommended (BIW BPP) and serial U/S every 3-4 weeks for growth.   Delivery @ 34 weeks is recommended.  Outcomes may be better than originally thought as reflected by a recent study of conservative management of PROM prior to 24 weeks (excluded were patients who delivered within 12 hours of PROM, multiple gestation, anomalous fetuses and total number of patients -159- were low), indicate an overall survival of 56% of which 48% had no major neonatal morbidities (27% of all neonates).  [place], TA, [place], AG, [place], MS, Ellis Savage, MW, Silver, RM.  Outcomes of Expectantly Managed Premature Rupture of Membranes Occurring Before 24 weeks of Gestation.  Obstet Gynecol 2009;114:29-37.   IMPRESSIONS:  1) Anhydramnios due to SROM 2) DM - not discussed 3) CHTN - not discussed 4) Obesity - not discussed    RECOMMENDATIONS:  1) To MAU for SROM confirmation and observation 2) Outpatient management with weekly  OB and MFM follow-up 3) Admission @ 24 weeks for Celestone, MgSO4 for fetal neuro-prophylaxis, serial U/S for growth, A-P surveillance and delivery @ 34 weeks 4) 17-OH Progesterone Caproate weekly from 16-36 weeks for all subsequent pregnancies             60 minutes spent in face-to-face consultation with greater than 50% of the time spent in counseling.    Thank you for utilizing our ultrasound and consultative services.  If I may be of any further service, please do not hesitate to contact me.  Sincerely,   Patsi Sears, MD Maternal-Fetal Medicine   Copy of report sent to practitioner/clinic.

## 2018-03-09 NOTE — MAU Note (Signed)
Pt in MAU lobby with family and spiritual care [place]. Offered a room inside MAU for privacy to talk. Pt made aware that other people are in the lobby and walking by. Pt states that she is not talking loud and she would like to stay where she is sitting in the lobby.

## 2018-03-09 NOTE — MAU Note (Signed)
Pt sent from MFM for ROM. Denies any vaginal bleeding

## 2018-03-09 NOTE — ED Notes (Signed)
Report called to Ginger Morris, CN, MAU. Patient escorted to registration.

## 2018-03-10 ENCOUNTER — Inpatient Hospital Stay (HOSPITAL_COMMUNITY)
Admission: AD | Admit: 2018-03-10 | Discharge: 2018-03-10 | Disposition: A | Discharge status: Home / Self Care | Payer: Medicaid Other | Source: Ambulatory Visit | Attending: Family Medicine | Admitting: Family Medicine

## 2018-03-10 ENCOUNTER — Encounter (HOSPITAL_COMMUNITY): Payer: Self-pay

## 2018-03-10 ENCOUNTER — Other Ambulatory Visit: Payer: Self-pay

## 2018-03-10 DIAGNOSIS — O10012 Pre-existing essential hypertension complicating pregnancy, second trimester: Secondary | ICD-10-CM | POA: Diagnosis not present

## 2018-03-10 DIAGNOSIS — O99512 Diseases of the respiratory system complicating pregnancy, second trimester: Secondary | ICD-10-CM | POA: Insufficient documentation

## 2018-03-10 DIAGNOSIS — G4730 Sleep apnea, unspecified: Secondary | ICD-10-CM | POA: Diagnosis not present

## 2018-03-10 DIAGNOSIS — Z79899 Other long term (current) drug therapy: Secondary | ICD-10-CM | POA: Diagnosis not present

## 2018-03-10 DIAGNOSIS — R51 Headache: Secondary | ICD-10-CM | POA: Diagnosis present

## 2018-03-10 DIAGNOSIS — B9789 Other viral agents as the cause of diseases classified elsewhere: Secondary | ICD-10-CM | POA: Diagnosis not present

## 2018-03-10 DIAGNOSIS — Z794 Long term (current) use of insulin: Secondary | ICD-10-CM | POA: Insufficient documentation

## 2018-03-10 DIAGNOSIS — J029 Acute pharyngitis, unspecified: Secondary | ICD-10-CM | POA: Diagnosis present

## 2018-03-10 DIAGNOSIS — Z3A19 19 weeks gestation of pregnancy: Secondary | ICD-10-CM | POA: Diagnosis not present

## 2018-03-10 DIAGNOSIS — K219 Gastro-esophageal reflux disease without esophagitis: Secondary | ICD-10-CM | POA: Insufficient documentation

## 2018-03-10 DIAGNOSIS — O24112 Pre-existing diabetes mellitus, type 2, in pregnancy, second trimester: Secondary | ICD-10-CM | POA: Diagnosis not present

## 2018-03-10 DIAGNOSIS — J329 Chronic sinusitis, unspecified: Secondary | ICD-10-CM | POA: Diagnosis not present

## 2018-03-10 DIAGNOSIS — O99352 Diseases of the nervous system complicating pregnancy, second trimester: Secondary | ICD-10-CM | POA: Insufficient documentation

## 2018-03-10 DIAGNOSIS — Z7982 Long term (current) use of aspirin: Secondary | ICD-10-CM | POA: Diagnosis not present

## 2018-03-10 DIAGNOSIS — O9989 Other specified diseases and conditions complicating pregnancy, childbirth and the puerperium: Secondary | ICD-10-CM | POA: Diagnosis not present

## 2018-03-10 DIAGNOSIS — O99612 Diseases of the digestive system complicating pregnancy, second trimester: Secondary | ICD-10-CM | POA: Diagnosis not present

## 2018-03-10 LAB — URINALYSIS, ROUTINE W REFLEX MICROSCOPIC
BILIRUBIN URINE: NEGATIVE
GLUCOSE, UA: NEGATIVE mg/dL
HGB URINE DIPSTICK: NEGATIVE
Ketones, ur: NEGATIVE mg/dL
Leukocytes, UA: NEGATIVE
Nitrite: NEGATIVE
PH: 6 (ref 5.0–8.0)
Protein, ur: NEGATIVE mg/dL
SPECIFIC GRAVITY, URINE: 1.011 (ref 1.005–1.030)

## 2018-03-10 LAB — GC/CHLAMYDIA PROBE AMP (~~LOC~~) NOT AT ARMC
CHLAMYDIA, DNA PROBE: NEGATIVE
NEISSERIA GONORRHEA: NEGATIVE

## 2018-03-10 NOTE — MAU Note (Signed)
Doesn't know if she has a fever or what.  Her face has been cold and she has a HA.  Hasn't taken any Tylenol.  Wanted to make sure it was nothing with the baby. Had a sore throat too.

## 2018-03-10 NOTE — MAU Provider Note (Signed)
History     CSN: [id number]  Arrival date and time: 03/10/18 1601   First Provider Initiated Contact with Patient 03/10/18 1649      Chief Complaint  Patient presents with  . Headache  . Sore Throat   HPI This is a 27 year old G3, P2 at 19 weeks and 3 days.  She is seen for sore throat that started yesterday.  She also has sinus tenderness that started yesterday as well.  Has purulent sputum.  No fevers, chills although face has been feeling cold.  Has not tried anything.  OB History    Gravida  3   Para  2   Term  2   Preterm  0   AB  0   Living  2     SAB  0   TAB  0   Ectopic  0   Multiple  0   Live Births  2           Past Medical History:  Diagnosis Date  . DM (diabetes mellitus), type 2 (HCC)   . GERD (gastroesophageal reflux disease)   . Hypertension   . Sleep apnea     History reviewed. No pertinent surgical history.  Family History  Adopted: Yes  Family history unknown: Yes    Social History   Tobacco Use  . Smoking status: Never Smoker  . Smokeless tobacco: Never Used  Substance Use Topics  . Alcohol use: No  . Drug use: No    Allergies:  Allergies  Allergen Reactions  . Shellfish-Derived Products Swelling    Medications Prior to Admission  Medication Sig Dispense Refill Last Dose  . aspirin EC 81 MG tablet Take 1 tablet (81 mg total) by mouth daily. Take after 12 weeks for prevention of preeclampsia later in pregnancy 300 tablet 2 Past Week at Unknown time  . Doxylamine-Pyridoxine 10-10 MG TBEC Take 2 tablets by mouth at bedtime. (Patient not taking: Reported on 12/31/2017) 60 tablet 0 Not Taking  . glycopyrrolate (ROBINUL) 1 MG tablet Take 1 tablet (1 mg total) by mouth 2 (two) times daily. (Patient not taking: Reported on 01/28/2018) 60 tablet 1 Not Taking  . Insulin Glargine (LANTUS) 100 UNIT/ML Solostar Pen Inject 10 Units into the skin daily at 10 pm. 15 mL 11 unknown at Unknown time  . metFORMIN (GLUCOPHAGE) 500 MG  tablet Take 1 tablet (500 mg total) by mouth 2 (two) times daily with a meal. (Patient not taking: Reported on 01/28/2018) 60 tablet 0 Not Taking  . metoCLOPramide (REGLAN) 10 MG tablet Take 1 tablet (10 mg total) by mouth every 6 (six) hours. (Patient not taking: Reported on 12/22/2017) 120 tablet 1 Not Taking  . metoCLOPramide (REGLAN) 10 MG tablet Take 1 tablet (10 mg total) by mouth 3 (three) times daily before meals. (Patient not taking: Reported on 01/28/2018) 90 tablet 1 Not Taking  . ondansetron (ZOFRAN ODT) 8 MG disintegrating tablet Take 1 tablet (8 mg total) by mouth every 8 (eight) hours as needed for nausea or vomiting. (Patient not taking: Reported on 01/15/2018) 20 tablet 0 Not Taking  . ondansetron (ZOFRAN) 8 MG tablet Take 1 tablet (8 mg total) by mouth every 6 (six) hours. (Patient not taking: Reported on 01/28/2018) 20 tablet 0 Not Taking  . Prenatal MV & Min w/FA-DHA (PRENATAL ADULT GUMMY/DHA/FA) 0.4-25 MG CHEW Chew 1 tablet by mouth daily. 30 tablet 12 03/09/2018 at Unknown time  . promethazine (PHENERGAN) 25 MG tablet Take 1 tablet (25 mg  total) by mouth at bedtime as needed for nausea or vomiting. (Patient not taking: Reported on 01/15/2018) 30 tablet 0 Not Taking  . ranitidine (ZANTAC) 150 MG tablet Take 1 tablet (150 mg total) by mouth 2 (two) times daily. (Patient not taking: Reported on 02/25/2018) 60 tablet 2 Not Taking    Review of Systems  All other systems reviewed and are negative.  Physical Exam   Blood pressure 117/76, pulse 96, temperature 98.8 F (37.1 C), temperature source Oral, resp. rate 18, weight 94.3 kg, last menstrual period 10/07/2017, SpO2 100 %.  Physical Exam  Constitutional: She is oriented to person, place, and time. She appears well-developed and well-nourished.  HENT:  Head: Normocephalic and atraumatic.  Right Ear: External ear normal.  Left Ear: External ear normal.  Nose: No mucosal edema, rhinorrhea, nose lacerations, sinus tenderness, nasal  deformity, septal deviation or nasal septal hematoma. No epistaxis.  No foreign bodies. Right sinus exhibits maxillary sinus tenderness (mild) and frontal sinus tenderness (mild). Left sinus exhibits maxillary sinus tenderness (mild) and frontal sinus tenderness (mild).  Mouth/Throat: Mucous membranes are normal. No oropharyngeal exudate, posterior oropharyngeal edema, posterior oropharyngeal erythema or tonsillar abscesses.  Eyes: Pupils are equal, round, and reactive to light. Conjunctivae are normal. Right eye exhibits no discharge.  Neck: Normal range of motion. Neck supple. No tracheal deviation present. No thyromegaly present.  Cardiovascular: Normal rate, regular rhythm and normal heart sounds.  Respiratory: Effort normal and breath sounds normal. No respiratory distress. She has no wheezes. She has no rales. She exhibits no tenderness.  GI: Soft. Bowel sounds are normal. She exhibits no distension. There is no tenderness. There is no rebound and no guarding.  Neurological: She is alert and oriented to person, place, and time.  Skin: Skin is warm and dry.  Psychiatric: She has a normal mood and affect. Her behavior is normal. Judgment and thought content normal.    MAU Course  Procedures  MDM Viral URI  Assessment and Plan     ICD-10-CM   1. Viral sinusitis J32.9    B97.89   2. [redacted] weeks gestation of pregnancy Z3A.19    OTC meds discussed with symptomatic treatment. No antibiotics needed at this time. F/u with OB  Rhona Raider Stinson 03/10/2018, 4:56 PM

## 2018-03-10 NOTE — Discharge Instructions (Signed)

## 2018-03-11 ENCOUNTER — Ambulatory Visit (INDEPENDENT_AMBULATORY_CARE_PROVIDER_SITE_OTHER): Payer: Medicaid Other | Admitting: Obstetrics and Gynecology

## 2018-03-11 ENCOUNTER — Encounter: Payer: Self-pay | Admitting: Obstetrics and Gynecology

## 2018-03-11 VITALS — BP 104/73 | HR 108 | Wt 210.9 lb

## 2018-03-11 DIAGNOSIS — O0990 Supervision of high risk pregnancy, unspecified, unspecified trimester: Secondary | ICD-10-CM

## 2018-03-11 DIAGNOSIS — O10919 Unspecified pre-existing hypertension complicating pregnancy, unspecified trimester: Secondary | ICD-10-CM

## 2018-03-11 DIAGNOSIS — O4102X9 Oligohydramnios, second trimester, other fetus: Secondary | ICD-10-CM

## 2018-03-11 DIAGNOSIS — O24119 Pre-existing diabetes mellitus, type 2, in pregnancy, unspecified trimester: Secondary | ICD-10-CM

## 2018-03-11 DIAGNOSIS — O42119 Preterm premature rupture of membranes, onset of labor more than 24 hours following rupture, unspecified trimester: Secondary | ICD-10-CM

## 2018-03-11 LAB — CULTURE, BETA STREP (GROUP B ONLY)

## 2018-03-11 NOTE — Progress Notes (Signed)
error 

## 2018-03-11 NOTE — Progress Notes (Signed)
   PRENATAL VISIT NOTE  Subjective:  Kathryn Shelton is a 27 y.o. G3P2002 at [redacted]w[redacted]d being seen today for ongoing prenatal care.  She is currently monitored for the following issues for this high-risk pregnancy and has Supervision of high risk pregnancy, antepartum; Chronic hypertension during pregnancy, antepartum; Pre-existing type 2 diabetes mellitus during pregnancy, antepartum; Bacterial vaginitis; Nausea and vomiting during pregnancy prior to [redacted] weeks gestation; Oligohydramnios antepartum, second trimester, other fetus; Ruptured, membranes, premature; Obesity, morbid (HCC); and Preterm premature rupture of membranes, unspecified as to length of time between rupture and onset of labor, second trimester on their problem list.  Patient reports feeling stuffed up but otherwise okay. Feels cough/cold, congestion. Reports chills with congestion, denies fever.   Contractions: Not present. Vag. Bleeding: None.  Movement: Absent. Denies leaking of fluid.   The following portions of the patient's history were reviewed and updated as appropriate: allergies, current medications, past family history, past medical history, past social history, past surgical history and problem list. Problem list updated.  Objective:   Vitals:   03/11/18 1507  BP: 104/73  Pulse: (!) 108  Weight: 210 lb 14.4 oz (95.7 kg)    Fetal Status: Fetal Heart Rate (bpm): 160   Movement: Absent     General:  Alert, oriented and cooperative. Patient is in no acute distress.  Skin: Skin is warm and dry. No rash noted.   Cardiovascular: Normal heart rate noted  Respiratory: Normal respiratory effort, no problems with respiration noted  Abdomen: Soft, gravid, appropriate for gestational age.  Pain/Pressure: Absent     Pelvic: Cervical exam deferred        Extremities: Normal range of motion.  Edema: None  Mental Status: Normal mood and affect. Normal behavior. Normal judgment and thought content.   Assessment and Plan:    Pregnancy: G3P2002 at [redacted]w[redacted]d  1. Supervision of high risk pregnancy, antepartum  2. Oligohydramnios antepartum, second trimester, other fetus PPROM  3. Preterm premature rupture of membranes with onset of labor more than 24 hours following rupture S/p PPROM approx 17 weeks, noted on exam on 03/09/18 at [redacted]w[redacted]d S/p MFM consult Patient opted for expectant management, had long discussion with patient and partner and reviewed expected course, low likelihood of progression to viability, risk of fetal demise, high likelihood of significant morbidity if she does make it to viability. They discussed and would like to continue expectant management. Reviewed s/s infection and that she must present to hospital immediately with any concerning symptoms, they verbalize understanding.   4. Pre-existing type 2 diabetes mellitus during pregnancy, antepartum Patient not checking sugars, states she is waiting on strips. Encouraged her to check CBG  5. Chronic hypertension during pregnancy, antepartum Stable no meds Cont baby ASA  Preterm labor symptoms and general obstetric precautions including but not limited to vaginal bleeding, contractions, leaking of fluid and fetal movement were reviewed in detail with the patient. Please refer to After Visit Summary for other counseling recommendations.  Return in about 1 week (around 03/18/2018) for OB visit (MD).  Future Appointments  Date Time Provider Department Center  04/06/2018  2:15 PM [facility] [place] 4 [facility]    Conan Bowens, MD

## 2018-03-13 ENCOUNTER — Encounter (HOSPITAL_COMMUNITY): Payer: Self-pay

## 2018-03-13 ENCOUNTER — Inpatient Hospital Stay (HOSPITAL_COMMUNITY)
Admission: AD | Admit: 2018-03-13 | Discharge: 2018-03-13 | Disposition: A | Discharge status: Home / Self Care | Payer: Medicaid Other | Source: Ambulatory Visit | Attending: Obstetrics and Gynecology | Admitting: Obstetrics and Gynecology

## 2018-03-13 DIAGNOSIS — O98512 Other viral diseases complicating pregnancy, second trimester: Secondary | ICD-10-CM | POA: Diagnosis not present

## 2018-03-13 DIAGNOSIS — B002 Herpesviral gingivostomatitis and pharyngotonsillitis: Secondary | ICD-10-CM

## 2018-03-13 DIAGNOSIS — J069 Acute upper respiratory infection, unspecified: Secondary | ICD-10-CM | POA: Diagnosis not present

## 2018-03-13 DIAGNOSIS — Z3A19 19 weeks gestation of pregnancy: Secondary | ICD-10-CM | POA: Diagnosis not present

## 2018-03-13 DIAGNOSIS — B001 Herpesviral vesicular dermatitis: Secondary | ICD-10-CM | POA: Insufficient documentation

## 2018-03-13 DIAGNOSIS — O99512 Diseases of the respiratory system complicating pregnancy, second trimester: Secondary | ICD-10-CM | POA: Diagnosis not present

## 2018-03-13 DIAGNOSIS — O42912 Preterm premature rupture of membranes, unspecified as to length of time between rupture and onset of labor, second trimester: Secondary | ICD-10-CM | POA: Diagnosis present

## 2018-03-13 LAB — URINALYSIS, ROUTINE W REFLEX MICROSCOPIC
BILIRUBIN URINE: NEGATIVE
Bacteria, UA: NONE SEEN
Glucose, UA: NEGATIVE mg/dL
Hgb urine dipstick: NEGATIVE
KETONES UR: NEGATIVE mg/dL
Nitrite: NEGATIVE
Protein, ur: NEGATIVE mg/dL
SPECIFIC GRAVITY, URINE: 1.012 (ref 1.005–1.030)
pH: 5 (ref 5.0–8.0)

## 2018-03-13 MED ORDER — VALACYCLOVIR HCL 1 G PO TABS: 2000.0000 mg | ORAL_TABLET | Freq: Two times a day (BID) | ORAL | 0 refills | Status: AC

## 2018-03-13 NOTE — Discharge Instructions (Signed)
Cold Sore A cold sore, also called a fever blister, is a skin infection that causes small, fluid-filled sores to form inside of the mouth or on the lips, gums, nose, chin, or cheeks. Cold sores can spread to other parts of the body, such as the eyes or fingers. In some people with other medical conditions, cold sores can spread to multiple other body sites, including the genitals. Cold sores can be spread or passed from person to person (contagious) until the sores crust over completely. What are the causes? Cold sores are caused by the herpes simplex virus (HSV-1). HSV-1 is closely related to the virus that causes genital herpes (HSV-2), but these viruses are not the same. Once a person is infected with HSV-1, the virus remains permanently in the body. HSV-1 is spread from person to person through close contact, such as through kissing, touching the affected area, or sharing personal items such as lip balm, razors, or eating utensils. What increases the risk? A cold sore outbreak is more likely to develop in people who:  Are tired, stressed, or sick.  Are menstruating.  Are pregnant.  Take certain medicines.  Are exposed to cold weather or too much sun.  What are the signs or symptoms? Symptoms of a cold sore outbreak often go through different stages. Here is how a cold sore develops:  Tingling, itching, or burning is felt 1-2 days before the outbreak.  Fluid-filled blisters appear on the lips, inside the mouth, on the nose, or on the cheeks.  The blisters start to ooze clear fluid.  The blisters dry up and a yellow crust appears in its place.  The crust falls off.  Other symptoms include:  Fever.  Sore throat.  Headache.  Muscle aches.  Swollen neck glands.  You also may not have any symptoms. How is this diagnosed? This condition is often diagnosed based on your medical history and a physical exam. Your health care provider may swab your sore and then examine it in  the lab. Rarely, blood tests may be done to check for HSV-1. How is this treated? There is no cure for cold sores or HSV-1. There also is no vaccine for HSV-1. Most cold sores go away on their own without treatment within two weeks. Medicines cannot make the infection go away, but medicines can:  Help relieve some of the pain associated with the sores.  Work to stop the virus from multiplying.  Shorten healing time.  Medicines may be in the form of creams, gels, pills, or a shot. Follow these instructions at home: Medicines  Take or apply over-the-counter and prescription medicines only as told by your health care provider.  Use a cotton-tip swab to apply creams or gels to your sores. Sore Care  Do not touch the sores or pick the scabs.  Wash your hands often. Do not touch your eyes without washing your hands first.  Keep the sores clean and dry.  If directed, apply ice to the sores: ? Put ice in a plastic bag. ? Place a towel between your skin and the bag. ? Leave the ice on for 20 minutes, 2-3 times per day. Lifestyle  Do not kiss, have oral sex, or share personal items until your sores heal.  Eat a soft, bland diet. Avoid eating hot, cold, or salty foods. These can hurt your mouth.  Use a straw if it hurts to drink out of a glass.  Avoid the sun and limit your stress if these things   trigger outbreaks. If sun causes cold sores, apply sunscreen on your lips before being out in the sun. Contact a health care provider if:  You have symptoms for more than two weeks.  You have pus coming from the sores.  You have redness that is spreading.  You have pain or irritation in your eye.  You get sores on your genitals.  Your sores do not heal within two weeks.  You have frequent cold sore outbreaks. Get help right away if:  You have a fever and your symptoms suddenly get worse.  You have a headache and confusion. This information is not intended to replace advice  given to you by your health care provider. Make sure you discuss any questions you have with your health care provider. Document Released: 05/09/2000 Document Revised: 01/04/2016 Document Reviewed: 03/02/2015 Elsevier Interactive Patient Education  2018 [employer] Inc[employer]  

## 2018-03-13 NOTE — MAU Provider Note (Addendum)
Chief Complaint: Nasal Congestion; Cough; and Rupture of Membranes   First Provider Initiated Contact with Patient 03/13/18 0907     SUBJECTIVE HPI: Kathryn Shelton is a 27 y.o. G3P2002 at [redacted]w[redacted]d who presents to [facility] reporting LOF, URI symptoms, & cold sores.  Patient diagnosed with PPROM last week. Had chosen expectant managements. States she was leaking some clear fluid this morning. Denies fever/chills or abdominal pain. No vaginal bleeding.  Was seen in MAU on Wednesday for viral URI. Has not taken any of the medications that Dr. Adrian Blackwater discussed with her. Endorses cough and nasal congestion.  Has history of oral HSV. Current outbreak started a few days ago. States has never been this bad. Has been treating with carmex without relief. States lips are painful. Describes as burning sensation that she rates 7/10. Drinking and licking her lips make pain worse.    Past Medical History:  Diagnosis Date  . DM (diabetes mellitus), type 2 (HCC)   . GERD (gastroesophageal reflux disease)   . Hypertension   . Sleep apnea    OB History  Gravida Para Term Preterm AB Living  3 2 2  0 0 2  SAB TAB Ectopic Multiple Live Births  0 0 0 0 2    # Outcome Date GA Lbr Len/2nd Weight Sex Delivery Anes PTL Lv  3 Current           2 Term 12/11/11    F Vag-Spont  N LIV  1 Term 09/04/10    F Vag-Spont   LIV   History reviewed. No pertinent surgical history. Social History   Socioeconomic History  . Marital status: Legally Separated    Spouse name: Not on file  . Number of children: 2  . Years of education: [employer]  . Highest education level: 12th grade  Occupational History  . Not on file  Social Needs  . Financial resource strain: Not very hard  . Food insecurity:    Worry: Never true    Inability: Never true  . Transportation needs:    Medical: No    Non-medical: No  Tobacco Use  . Smoking status: Never Smoker  . Smokeless tobacco: Never Used  Substance and Sexual  Activity  . Alcohol use: No  . Drug use: No  . Sexual activity: Not Currently    Birth control/protection: None  Lifestyle  . Physical activity:    Days per week: Not on file    Minutes per session: Not on file  . Stress: Not at all  Relationships  . Social connections:    Talks on phone: More than three times a week    Gets together: Not on file    Attends religious service: Never    Active member of club or organization: No    Attends meetings of clubs or organizations: Never    Relationship status: Separated  . Intimate partner violence:    Fear of current or ex partner: No    Emotionally abused: No    Physically abused: No    Forced sexual activity: No  Other Topics Concern  . Not on file  Social History Narrative  . Not on file   Family History  Adopted: Yes  Family history unknown: Yes   No current facility-administered medications on file prior to encounter.    Current Outpatient Medications on File Prior to Encounter  Medication Sig Dispense Refill  . aspirin EC 81 MG tablet Take 1 tablet (81 mg total) by mouth daily.  Take after 12 weeks for prevention of preeclampsia later in pregnancy 300 tablet 2  . Doxylamine-Pyridoxine 10-10 MG TBEC Take 2 tablets by mouth at bedtime. 60 tablet 0  . glycopyrrolate (ROBINUL) 1 MG tablet Take 1 tablet (1 mg total) by mouth 2 (two) times daily. 60 tablet 1  . Insulin Glargine (LANTUS) 100 UNIT/ML Solostar Pen Inject 10 Units into the skin daily at 10 pm. (Patient not taking: Reported on 03/11/2018) 15 mL 11  . metoCLOPramide (REGLAN) 10 MG tablet Take 1 tablet (10 mg total) by mouth every 6 (six) hours. 120 tablet 1  . metoCLOPramide (REGLAN) 10 MG tablet Take 1 tablet (10 mg total) by mouth 3 (three) times daily before meals. 90 tablet 1  . ondansetron (ZOFRAN ODT) 8 MG disintegrating tablet Take 1 tablet (8 mg total) by mouth every 8 (eight) hours as needed for nausea or vomiting. 20 tablet 0  . ondansetron (ZOFRAN) 8 MG tablet  Take 1 tablet (8 mg total) by mouth every 6 (six) hours. 20 tablet 0  . Prenatal MV & Min w/FA-DHA (PRENATAL ADULT GUMMY/DHA/FA) 0.4-25 MG CHEW Chew 1 tablet by mouth daily. 30 tablet 12  . promethazine (PHENERGAN) 25 MG tablet Take 1 tablet (25 mg total) by mouth at bedtime as needed for nausea or vomiting. 30 tablet 0  . ranitidine (ZANTAC) 150 MG tablet Take 1 tablet (150 mg total) by mouth 2 (two) times daily. 60 tablet 2   Allergies  Allergen Reactions  . Shellfish-Derived Products Swelling    I have reviewed patient's Past Medical Hx, Surgical Hx, Family Hx, Social Hx, medications and allergies.   Review of Systems  Constitutional: Negative.   HENT: Positive for congestion, mouth sores and postnasal drip.   Respiratory: Positive for cough. Negative for shortness of breath and wheezing.   Cardiovascular: Negative for chest pain.  Gastrointestinal: Negative.   Genitourinary: Positive for vaginal discharge. Negative for vaginal bleeding.    OBJECTIVE Patient Vitals for the past 24 hrs:  BP Temp Temp src Pulse Resp SpO2 Weight  03/13/18 0928 116/87 - - (!) 124 - - -  03/13/18 0813 107/73 98 F (36.7 C) Oral 83 18 100 % 97.1 kg   Constitutional: Well-developed, well-nourished female in no acute distress.  Cardiovascular: normal rate & rhythm, no murmur Respiratory: normal rate and effort. Lung sounds clear throughout GI: Abd soft, non-tender, Pos BS x 4. No guarding or rebound tenderness MS: Extremities nontender, no edema, normal ROM Neurologic: Alert and oriented x 4.  HEENT: multiple vesicular lesions on upper lip    LAB RESULTS Results for orders placed or performed during the hospital encounter of 03/13/18 (from the past 24 hour(s))  Urinalysis, Routine w reflex microscopic     Status: Abnormal   Collection Time: 03/13/18  8:17 AM  Result Value Ref Range   Color, Urine YELLOW YELLOW   APPearance CLEAR CLEAR   Specific Gravity, Urine 1.012 1.005 - 1.030   pH 5.0 5.0 -  8.0   Glucose, UA NEGATIVE NEGATIVE mg/dL   Hgb urine dipstick NEGATIVE NEGATIVE   Bilirubin Urine NEGATIVE NEGATIVE   Ketones, ur NEGATIVE NEGATIVE mg/dL   Protein, ur NEGATIVE NEGATIVE mg/dL   Nitrite NEGATIVE NEGATIVE   Leukocytes, UA TRACE (A) NEGATIVE   RBC / HPF 0-5 0 - 5 RBC/hpf   WBC, UA 6-10 0 - 5 WBC/hpf   Bacteria, UA NONE SEEN NONE SEEN   Squamous Epithelial / LPF 0-5 0 - 5  IMAGING No results found.  MAU COURSE Orders Placed This Encounter  Procedures  . Urinalysis, Routine w reflex microscopic  . Discharge patient   Meds ordered this encounter  Medications  . valACYclovir (VALTREX) 1000 MG tablet    Sig: Take 2 tablets (2,000 mg total) by mouth 2 (two) times daily for 1 day.    Dispense:  4 tablet    Refill:  0    Order Specific Question:   Supervising Provider    Answer:   CONSTANT, PEGGY [[id number]]    MDM FHT 154 by doppler Afebrile. No abdominal pain.  Discussed ongoing plan regarding PPROM. Avoided exam today to decrease chance of infection. Also discussed pelvic rest & no IC d/t risk for infection.  Will tx oral HSV with valtrex, sent to pharmacy. Discussed not to share drinks or kiss anyone. Wash hands after touching lips and make sure to avoid touching her eyes.  Reiterated tx of URI using OTC meds.   ASSESSMENT 1. Oral herpes   2. Premature rupture of membranes in second trimester, unspecified duration to onset of labor   3. [redacted] weeks gestation of pregnancy   4. Viral upper respiratory tract infection     PLAN Discharge home in stable condition. Infection precautions  Allergies as of 03/13/2018      Reactions   Shellfish-derived Products Swelling      Medication List    TAKE these medications   aspirin EC 81 MG tablet Take 1 tablet (81 mg total) by mouth daily. Take after 12 weeks for prevention of preeclampsia later in pregnancy   Doxylamine-Pyridoxine 10-10 MG Tbec Take 2 tablets by mouth at bedtime.   glycopyrrolate 1 MG  tablet Commonly known as:  ROBINUL Take 1 tablet (1 mg total) by mouth 2 (two) times daily.   Insulin Glargine 100 UNIT/ML Solostar Pen Commonly known as:  LANTUS Inject 10 Units into the skin daily at 10 pm.   metoCLOPramide 10 MG tablet Commonly known as:  REGLAN Take 1 tablet (10 mg total) by mouth every 6 (six) hours.   metoCLOPramide 10 MG tablet Commonly known as:  REGLAN Take 1 tablet (10 mg total) by mouth 3 (three) times daily before meals.   ondansetron 8 MG disintegrating tablet Commonly known as:  ZOFRAN-ODT Take 1 tablet (8 mg total) by mouth every 8 (eight) hours as needed for nausea or vomiting.   ondansetron 8 MG tablet Commonly known as:  ZOFRAN Take 1 tablet (8 mg total) by mouth every 6 (six) hours.   Prenatal Adult Gummy/DHA/FA 0.4-25 MG Chew Chew 1 tablet by mouth daily.   promethazine 25 MG tablet Commonly known as:  PHENERGAN Take 1 tablet (25 mg total) by mouth at bedtime as needed for nausea or vomiting.   ranitidine 150 MG tablet Commonly known as:  ZANTAC Take 1 tablet (150 mg total) by mouth 2 (two) times daily.   valACYclovir 1000 MG tablet Commonly known as:  VALTREX Take 2 tablets (2,000 mg total) by mouth 2 (two) times daily for 1 day.        Judeth Horn, NP 03/13/2018  5:35 PM

## 2018-03-13 NOTE — MAU Note (Signed)
Kathryn Shelton is a 27 y.o. at [redacted]w[redacted]d here in MAU reporting: LOF clear. 1st noticed last night and has gotten worse this morning. Not having to wear a pad. Patient states she was not aware that her water was broke. Endorses not having made a decision yet about the options she was given during a previous visit. Pain score: denies Denies vaginal bleeding Patient states she has a cold. +congestion. Productive cough-yellow. Broken out on lips. Vitals:   03/13/18 0813  BP: 107/73  Pulse: 83  Resp: 18  Temp: 98 F (36.7 C)  SpO2: 100%    Lab orders placed from triage: ua

## 2018-03-16 ENCOUNTER — Ambulatory Visit (HOSPITAL_COMMUNITY): Payer: Medicaid Other

## 2018-03-18 ENCOUNTER — Ambulatory Visit (INDEPENDENT_AMBULATORY_CARE_PROVIDER_SITE_OTHER): Payer: Medicaid Other | Admitting: Obstetrics & Gynecology

## 2018-03-18 ENCOUNTER — Encounter: Payer: Self-pay | Admitting: Obstetrics & Gynecology

## 2018-03-18 VITALS — BP 120/89 | HR 97 | Wt 212.3 lb

## 2018-03-18 DIAGNOSIS — O42912 Preterm premature rupture of membranes, unspecified as to length of time between rupture and onset of labor, second trimester: Secondary | ICD-10-CM

## 2018-03-18 DIAGNOSIS — O10919 Unspecified pre-existing hypertension complicating pregnancy, unspecified trimester: Secondary | ICD-10-CM

## 2018-03-18 DIAGNOSIS — O24119 Pre-existing diabetes mellitus, type 2, in pregnancy, unspecified trimester: Secondary | ICD-10-CM

## 2018-03-18 DIAGNOSIS — O0990 Supervision of high risk pregnancy, unspecified, unspecified trimester: Secondary | ICD-10-CM

## 2018-03-18 MED ORDER — ACCU-CHEK GUIDE W/DEVICE KIT: 1.0000 | PACK | Freq: Four times a day (QID) | 0 refills | Status: DC

## 2018-03-18 MED ORDER — GLUCOSE BLOOD VI STRP: ORAL_STRIP | 12 refills | Status: DC

## 2018-03-18 NOTE — Progress Notes (Signed)
PRENATAL VISIT NOTE  Subjective:  Kathryn Shelton is a 27 y.o. G3P2002 at 60w4dbeing seen today for ongoing prenatal care.  She is currently monitored for the following issues for this high-risk pregnancy and has Supervision of high risk pregnancy, antepartum; Chronic hypertension during pregnancy, antepartum; Pre-existing type 2 diabetes mellitus during pregnancy, antepartum; Bacterial vaginitis; Nausea and vomiting during pregnancy prior to [redacted] weeks gestation; Oligohydramnios antepartum, second trimester, other fetus; Ruptured, membranes, premature; Obesity, morbid ([place]; and Preterm premature rupture of membranes, unspecified as to length of time between rupture and onset of labor, second trimester on their problem list.  Patient reports no complaints.  Contractions: Not present. Vag. Bleeding: None.  Movement: Absent. Denies leaking of fluid.   The following portions of the patient's history were reviewed and updated as appropriate: allergies, current medications, past family history, past medical history, past social history, past surgical history and problem list. Problem list updated.  Objective:   Vitals:   03/18/18 1010  BP: 120/89  Pulse: 97  Weight: 212 lb 4.8 oz (96.3 kg)    Fetal Status: Fetal Heart Rate (bpm): 140 on u/s   Movement: Absent     General:  Alert, oriented and cooperative. Patient is in no acute distress.  Skin: Skin is warm and dry. No rash noted.   Cardiovascular: Normal heart rate noted  Respiratory: Normal respiratory effort, no problems with respiration noted  Abdomen: Soft, gravid, nontender, appropriate for gestational age.  Pain/Pressure: Absent     Pelvic: Cervical exam deferred        Extremities: Normal range of motion.  Edema: None  Mental Status: Normal mood and affect. Normal behavior. Normal judgment and thought content.   [place] Ob Detail +14 Wk  Result Date:  03/09/2018 ----------------------------------------------------------------------  OBSTETRICS REPORT                       (Signed Final 03/09/2018 03:10 pm) ---------------------------------------------------------------------- Patient Info  ID #:       [id number]                         D.O.B.:  [birth year](27 yrs)  Name:       Kathryn Shelton                 Visit Date: 03/09/2018 01:37 pm ---------------------------------------------------------------------- Performed By  Performed By:     KBerlinda Last         Ref. Address:     Faculty                    RDMS  Attending:        RLajoyce Lauber     [facility]                   MD  Referred By:      PMora BellmanMD ---------------------------------------------------------------------- Orders   #  Description                          Code         Ordered By   1  [place] OB DETAIL +14 WK  70340.35     [place]  ----------------------------------------------------------------------   #  Order #                    Accession #                 Episode #   1  [id number]                  [id number]                  [id number]  ---------------------------------------------------------------------- Indications   Encounter for antenatal screening for          Z36.3   malformations (low risk panorama)   [redacted] weeks gestation of pregnancy                Z3A.19   Hypertension - Chronic/Pre-existing (no        O10.019   meds)   Pre-existing diabetes, type 2, in pregnancy,   O24.112   second trimester (metformin, insulin, not   currently taking either)   Oligohydraminios, second trimester,            O41.02X0   unspecified (unknown until today)  ---------------------------------------------------------------------- Fetal Evaluation  Num Of Fetuses:         1  Fetal Heart Rate(bpm):  150  Cardiac Activity:       Observed  Presentation:           Cephalic  Placenta:               Anterior  P. Cord Insertion:       Visualized  Amniotic Fluid  AFI FV:      Anhydramnios ---------------------------------------------------------------------- Biometry  BPD:        40  mm     G. Age:  18w 1d         10  %    CI:        70.17   %    70 - 86                                                          FL/HC:      19.1   %    16.1 - 18.3  HC:      152.3  mm     G. Age:  18w 2d          7  %    HC/AC:      1.00        1.09 - 1.39  AC:      152.9  mm     G. Age:  20w 3d         81  %    FL/BPD:     72.8   %  FL:       29.1  mm     G. Age:  19w 0d         32  %    FL/AC:      19.0   %    20 - 24  HUM:      27.4  mm     G. Age:  18w 5d         37  %  Est. FW:  300  gm    0 lb 11 oz      51  % ---------------------------------------------------------------------- OB History  Gravidity:    3         Term:   2        Prem:   0        SAB:   0  TOP:          0       Ectopic:  0        Living: 2 ---------------------------------------------------------------------- Gestational Age  LMP:           21w 6d        Date:  10/07/17                 EDD:   07/14/18  U/S Today:     19w 0d                                        EDD:   08/03/18  Best:          19w 2d     Det. ByLoman Chroman         EDD:   08/01/18                                      (12/22/17) ---------------------------------------------------------------------- Anatomy  Cranium:               Appears normal         Aortic Arch:            Appears normal  Cavum:                 Appears normal         Ductal Arch:            Appears normal  Ventricles:            Appears normal         Diaphragm:              Appears normal  Choroid Plexus:        Appears normal         Stomach:                Appears normal, left                                                                        sided  Cerebellum:            Appears normal         Abdomen:                Appears normal  Posterior Fossa:       Appears normal         Abdominal Wall:         Not well visualized  Nuchal Fold:            Appears normal         Cord Vessels:  Appears normal (3                                                                        vessel cord)  Face:                  Orbits nl; profile not Kidneys:                Appear normal                         well visualized  Lips:                  Not well visualized    Bladder:                Appears normal  Thoracic:              Appears normal         Spine:                  Not well visualized  Heart:                 Appears normal         Upper Extremities:      Appears normal,                         (4CH, axis, and situs                          hands nws  RVOT:                  Appears normal         Lower Extremities:      Appears normal,                                                                        feet nws  LVOT:                  Appears normal  Other:  Technically difficult due to low amniotic fluid. ---------------------------------------------------------------------- Doppler - Fetal Vessels  Umbilical Artery   S/D     %tile                                            ADFV    RDFV  4.93       69                                                No      No ---------------------------------------------------------------------- Cervix Uterus Adnexa  Cervix  Length:              3  cm.  Normal appearance by transabdominal scan. ---------------------------------------------------------------------- Comments  Kathryn Shelton is a 27 yo African American female, G 3 P  2002 LMP 0/38/20 EDC 07/14/18 by U/S now 19 2/7 weeks  seen in consultation as requested secondary to:  1) Anhydramnios that was found on U/S today.  Patient  reports a 2 week history of leaking clear fluid.  She denies  fever, chills, or contractions  PREVIOUS OBSTETRICAL HISTORY:  1) 2011 - NSVD @ term, female, ? weight, without  complications 2) 1610 - NSVD @ term, female, ? weight,  without complications   PREVIOUS GYN HISTORY:  Abnormal PAP - neg           GC - neg           Chlamydia -  neg    Syphilis - neg    CAT - 12 x irreg x 7       Contraception -  none  PREVIOUS MEDICAL HISTORY:  DM - childhood      HTN - ? date of diagnosis  Asthma - neg  neg        Thyroid - neg     Rheumatic Fever - neg    Heart - neg       Lung - neg        Liver - neg  Kidney - neg        Epilepsy - neg    TB - neg          Herpes - neg      UTI - neg  PREVIOUS SURGICAL HISTORY:  None  MEDICATIONS:  Prenatal Vitamins, Lantus 14 units Q AM  ALLERGIES/REACTIONS:  None  HABITS:  Smoking - neg       Drinking - neg    Drugs - neg  PSYCHOSOCIAL:  Separated; FOB is not involved  PROFESSION:  Homemaker  FAMILY HISTORY:  Adopted ---------------------------------------------------------------------- Impression  OLIGOHYDRAMNIOS:  General counseling was then performed regarding  oligohydramnios.  SROM, congenital renal agenesis, urinary  tract obstruction, IUGR, placental insufficiency, chromosomal  abnormalities and other congenital malformations were  discussed.  Marked oligohydramnios greater than 5 weeks  occurring before 25 weeks has been associated with a 27%  risk of hypoplastic fetal lungs.  The limitations of U/S in  making this diagnosis was stressed.   Increased perinatal  morbidity and mortality is present.  SROM:  General counseling was performed regarding spontaneous  rupture of membranes.  The increased risks of infection,  intrauterine fetal demise, pulmonary hypoplasia and preterm  labor/delivery were discussed.  The role of Celestone @ 24  weeks, GBS prophylaxis and short term tocolysis was  discussed.  Several investigators have added IV  Erythromycin in an attempt to prolong the latency period.  An  alternative to daily E-mycin is PO Azithromycin 1 gram orally  which is repeated 5 days later (total 2 doses) Close A-P  surveillance is recommended (BIW BPP) and serial U/S  every 3-4 weeks for growth.   Delivery @ 34 weeks is  recommended.  Outcomes may be better than originally thought as reflected   by a recent study of conservative management of PROM  prior to 24 weeks (excluded were patients who delivered  within 12 hours of PROM, multiple gestation, anomalous  fetuses and total number of patients -159- were low), indicate  an overall survival of 56% of which 48%  had no major  neonatal morbidities (27% of all neonates).  [place], TA,  [place], AG, [place], MS, Marily Lente, MW, [place], RM.  Outcomes of Expectantly Managed Premature Rupture of  Membranes Occurring Before 24 weeks of Gestation.  Obstet  Gynecol 2009;114:29-37. ---------------------------------------------------------------------- Recommendations  1) To MAU for SROM confirmation and observation 2)  Outpatient management with weekly OB and MFM follow-up  until 24 weeks 3) Admission @ 24 weeks for Celestone,  MgSO4 for fetal neuro-prophylaxis, serial U/S for growth, A-P  surveillance and delivery @ 34 weeks 4) 17-OH  Progesterone Caproate weekly from 16-36 weeks for all  subsequent pregnancies   60 minutes spent in face-to-face consultation with greater  than 50% of the time spent in counseling. ----------------------------------------------------------------------               Lajoyce Lauber, MD Electronically Signed Final Report   03/09/2018 03:10 pm ----------------------------------------------------------------------   Assessment and Plan:  Pregnancy: T6O0600 at [redacted]w[redacted]d 1. Preterm premature rupture of membranes in second trimester Already had extensive counseling about this. No signs/symptoms of infection or abruption. Will be admitted at 242w5df still stable ([facility], not [facility] weeks). Repeat formal scan scheduled on 04/06/18.  2. Pre-existing type 2 diabetes mellitus during pregnancy, antepartum Unable to check BS as she does not have supplies. Reports taking insulin as prescribed though (this is opposite of what she told RN). Emphasized importance of glycemic control and need to check BS.  Medicaid  covered meter and strips prescribed, reevaluate in one week. - Blood Glucose Monitoring Suppl (ACCU-CHEK GUIDE) w/Device KIT; 1 Device by Does not apply route 4 (four) times daily.  Dispense: 1 kit; Refill: 0 - glucose blood (ACCU-CHEK GUIDE) test strip; Use to check blood sugars four times a day was instructed  Dispense: 50 each; Refill: 12  3. Chronic hypertension during pregnancy, antepartum Stable BP, continue ASA.   4. Supervision of high risk pregnancy, antepartum No other complaints or concerns.  Routine obstetric precautions reviewed.  Please refer to After Visit Summary for other counseling recommendations.  Return in about 1 week (around 03/25/2018) for OB Visit.  Future Appointments  Date Time Provider [place]10/31/2019  3:45 PM DaSloan LeiterMD [facility] None  04/06/2018  2:15 PM [place]  UgVerita SchneidersMD

## 2018-03-18 NOTE — Patient Instructions (Signed)
Return to clinic for any scheduled appointments or obstetric concerns, or go to MAU for evaluation    Type 1 or Type 2 Diabetes Mellitus During Pregnancy, Diagnosis Type 1 diabetes (type 1 diabetes mellitus) and type 2 diabetes (type 2 diabetes mellitus) are long-term (chronic) diseases. Your diabetes may be caused by one or both of these problems:  Your body does not make enough of a hormone called insulin.  Your body does not respond in a normal way to insulin that it makes.  Insulin lets sugars (glucose) go into cells in the body. This gives you energy. If you have diabetes, sugars cannot get into cells. This causes high blood sugar (hyperglycemia). If diabetes is treated, it may not hurt you or your baby. Your doctor will set treatment goals for you. In general, you should have these blood sugar levels:  After not eating for a long time (fasting): 95 mg/dL (5.3 mmol/L).  After meals (postprandial): ? One hour after a meal: at or below 140 mg/dL (7.8 mmol/L). ? Two hours after a meal: at or below 120 mg/dL (6.7 mmol/L).  A1c (hemoglobin A1c) level: 6-6.5%.  Follow these instructions at home: Questions to Ask Your Doctor  You may want to ask these questions:  Do I need to meet with a diabetes educator?  Where can I find a support group for people with diabetes?  What equipment will I need to care for myself at home?  What diabetes medicines do I need? When should I take them?  How often do I need to check my blood sugar?  What number can I call if I have questions?  When is my next doctor's visit?  General instructions  Take over-the-counter and prescription medicines only as told by your doctor.  Stay at a healthy weight during pregnancy.  Keep all follow-up visits as told by your doctor. This is important. Contact a doctor if:  Your blood sugar is at or above 240 mg/dL (16.1 mmol/L).  Your blood sugar is at or above 200 mg/dL (09.6 mmol/L), and you have ketones  in your pee (urine).  You have been sick or have had a fever for 2 days or more, and you are not getting better.  You have any of these problems for more than 6 hours: ? You cannot eat or drink. ? You feel sick to your stomach (nauseous). ? You throw up (vomit). ? You have watery poop (diarrhea). Get help right away if:  Your blood sugar is lower than 54 mg/dL (3 mmol/L).  You get confused.  You have trouble: ? Thinking clearly. ? Breathing.  Your baby moves less than normal.  You have: ? Moderate or large ketone levels in your pee (urine). ? Bleeding from your vagina. ? Unusual fluid coming from your vagina. ? Early contractions. These may feel like tightness in your belly. This information is not intended to replace advice given to you by your health care provider. Make sure you discuss any questions you have with your health care provider. Document Released: 09/03/2015 Document Revised: 03/26/2016 Document Reviewed: 06/15/2015 Elsevier Interactive Patient Education  2017 [employer].

## 2018-03-23 ENCOUNTER — Ambulatory Visit (HOSPITAL_COMMUNITY): Payer: Medicaid Other

## 2018-03-25 ENCOUNTER — Encounter: Payer: Medicaid Other | Admitting: Obstetrics and Gynecology

## 2018-03-29 ENCOUNTER — Other Ambulatory Visit: Payer: Self-pay

## 2018-03-29 ENCOUNTER — Ambulatory Visit (INDEPENDENT_AMBULATORY_CARE_PROVIDER_SITE_OTHER): Payer: Medicaid Other | Admitting: Obstetrics and Gynecology

## 2018-03-29 ENCOUNTER — Encounter: Payer: Self-pay | Admitting: Obstetrics and Gynecology

## 2018-03-29 VITALS — BP 115/78 | HR 103 | Wt 213.0 lb

## 2018-03-29 DIAGNOSIS — O24119 Pre-existing diabetes mellitus, type 2, in pregnancy, unspecified trimester: Secondary | ICD-10-CM

## 2018-03-29 DIAGNOSIS — O0990 Supervision of high risk pregnancy, unspecified, unspecified trimester: Secondary | ICD-10-CM

## 2018-03-29 DIAGNOSIS — O10919 Unspecified pre-existing hypertension complicating pregnancy, unspecified trimester: Secondary | ICD-10-CM

## 2018-03-29 DIAGNOSIS — O42912 Preterm premature rupture of membranes, unspecified as to length of time between rupture and onset of labor, second trimester: Secondary | ICD-10-CM

## 2018-03-29 NOTE — Progress Notes (Signed)
   PRENATAL VISIT NOTE  Subjective:  Kathryn Shelton is a 27 y.o. G3P2002 at [redacted]w[redacted]d being seen today for ongoing prenatal care.  She is currently monitored for the following issues for this high-risk pregnancy and has Supervision of high risk pregnancy, antepartum; Chronic hypertension during pregnancy, antepartum; Pre-existing type 2 diabetes mellitus during pregnancy, antepartum; Bacterial vaginitis; Nausea and vomiting during pregnancy prior to [redacted] weeks gestation; Oligohydramnios antepartum, second trimester, other fetus; Ruptured, membranes, premature; Obesity, morbid (HCC); Preterm premature rupture of membranes, unspecified as to length of time between rupture and onset of labor, second trimester; Type II diabetes mellitus, uncontrolled (HCC); Essential hypertension; Intermittent palpitations; Mixed hyperlipidemia; Panic attack; and Obstructive sleep apnea syndrome on their problem list.  Patient reports no complaints.  Contractions: Not present. Vag. Bleeding: None.  Movement: Present. Denies leaking of fluid.   The following portions of the patient's history were reviewed and updated as appropriate: allergies, current medications, past family history, past medical history, past social history, past surgical history and problem list. Problem list updated.  Objective:   Vitals:   03/29/18 1450  BP: 115/78  Pulse: (!) 103  Weight: 213 lb (96.6 kg)    Fetal Status: Fetal Heart Rate (bpm): 142 Fundal Height: 22 cm Movement: Present     General:  Alert, oriented and cooperative. Patient is in no acute distress.  Skin: Skin is warm and dry. No rash noted.   Cardiovascular: Normal heart rate noted  Respiratory: Normal respiratory effort, no problems with respiration noted  Abdomen: Soft, gravid, appropriate for gestational age.  Pain/Pressure: Absent     Pelvic: Cervical exam deferred        Extremities: Normal range of motion.  Edema: None  Mental Status: Normal mood and affect. Normal  behavior. Normal judgment and thought content.   Assessment and Plan:  Pregnancy: G3P2002 at [redacted]w[redacted]d  1. Supervision of high risk pregnancy, antepartum Patient is doing well without complaints  2. Chronic hypertension during pregnancy, antepartum Normotensive Continue ASA  3. Pre-existing type 2 diabetes mellitus during pregnancy, antepartum Patient has not been monitoring CBGs as she did not pick up testing supplies  4. Premature rupture of membranes in second trimester, unspecified duration to onset of labor Patient afebrile without abdominal pain. She reports persistent leakage of fluid Plan for admission at [redacted]w[redacted]d (High Risk Ob has been notified)  Preterm labor symptoms and general obstetric precautions including but not limited to vaginal bleeding, contractions, leaking of fluid and fetal movement were reviewed in detail with the patient. Please refer to After Visit Summary for other counseling recommendations.  No follow-ups on file.  Future Appointments  Date Time Provider Department Center  03/29/2018  3:45 PM Maitland Les[place], Gigi Gin, MD [facility] None  04/06/2018  2:15 PM [facility] [place] 4 [facility]    Catalina Antigua, MD

## 2018-03-30 ENCOUNTER — Ambulatory Visit (HOSPITAL_COMMUNITY): Payer: Medicaid Other

## 2018-04-02 ENCOUNTER — Other Ambulatory Visit: Payer: Self-pay

## 2018-04-02 ENCOUNTER — Encounter (HOSPITAL_COMMUNITY): Payer: Self-pay

## 2018-04-02 ENCOUNTER — Inpatient Hospital Stay (HOSPITAL_COMMUNITY)
Admission: AD | Admit: 2018-04-02 | Discharge: 2018-05-21 | DRG: 805 | Disposition: A | Discharge status: Home / Self Care | Payer: Medicaid Other | Attending: Obstetrics and Gynecology | Admitting: Obstetrics and Gynecology

## 2018-04-02 ENCOUNTER — Inpatient Hospital Stay (HOSPITAL_BASED_OUTPATIENT_CLINIC_OR_DEPARTMENT_OTHER): Payer: Medicaid Other

## 2018-04-02 DIAGNOSIS — O2412 Pre-existing diabetes mellitus, type 2, in childbirth: Secondary | ICD-10-CM | POA: Diagnosis present

## 2018-04-02 DIAGNOSIS — E119 Type 2 diabetes mellitus without complications: Secondary | ICD-10-CM | POA: Diagnosis present

## 2018-04-02 DIAGNOSIS — O24319 Unspecified pre-existing diabetes mellitus in pregnancy, unspecified trimester: Secondary | ICD-10-CM | POA: Diagnosis not present

## 2018-04-02 DIAGNOSIS — Z794 Long term (current) use of insulin: Secondary | ICD-10-CM

## 2018-04-02 DIAGNOSIS — O10012 Pre-existing essential hypertension complicating pregnancy, second trimester: Secondary | ICD-10-CM | POA: Diagnosis not present

## 2018-04-02 DIAGNOSIS — O10919 Unspecified pre-existing hypertension complicating pregnancy, unspecified trimester: Secondary | ICD-10-CM | POA: Diagnosis present

## 2018-04-02 DIAGNOSIS — O4103X0 Oligohydramnios, third trimester, not applicable or unspecified: Secondary | ICD-10-CM | POA: Diagnosis present

## 2018-04-02 DIAGNOSIS — Z3A24 24 weeks gestation of pregnancy: Secondary | ICD-10-CM | POA: Diagnosis not present

## 2018-04-02 DIAGNOSIS — O24112 Pre-existing diabetes mellitus, type 2, in pregnancy, second trimester: Secondary | ICD-10-CM

## 2018-04-02 DIAGNOSIS — Z3A29 29 weeks gestation of pregnancy: Secondary | ICD-10-CM | POA: Diagnosis not present

## 2018-04-02 DIAGNOSIS — K219 Gastro-esophageal reflux disease without esophagitis: Secondary | ICD-10-CM | POA: Diagnosis present

## 2018-04-02 DIAGNOSIS — O4102X9 Oligohydramnios, second trimester, other fetus: Secondary | ICD-10-CM | POA: Diagnosis present

## 2018-04-02 DIAGNOSIS — O42112 Preterm premature rupture of membranes, onset of labor more than 24 hours following rupture, second trimester: Secondary | ICD-10-CM | POA: Diagnosis not present

## 2018-04-02 DIAGNOSIS — O9962 Diseases of the digestive system complicating childbirth: Secondary | ICD-10-CM | POA: Diagnosis present

## 2018-04-02 DIAGNOSIS — O42912 Preterm premature rupture of membranes, unspecified as to length of time between rupture and onset of labor, second trimester: Principal | ICD-10-CM | POA: Diagnosis present

## 2018-04-02 DIAGNOSIS — O10013 Pre-existing essential hypertension complicating pregnancy, third trimester: Secondary | ICD-10-CM | POA: Diagnosis not present

## 2018-04-02 DIAGNOSIS — O24312 Unspecified pre-existing diabetes mellitus in pregnancy, second trimester: Secondary | ICD-10-CM | POA: Diagnosis not present

## 2018-04-02 DIAGNOSIS — O1002 Pre-existing essential hypertension complicating childbirth: Secondary | ICD-10-CM | POA: Diagnosis present

## 2018-04-02 DIAGNOSIS — O42919 Preterm premature rupture of membranes, unspecified as to length of time between rupture and onset of labor, unspecified trimester: Secondary | ICD-10-CM

## 2018-04-02 DIAGNOSIS — Z3A27 27 weeks gestation of pregnancy: Secondary | ICD-10-CM | POA: Diagnosis not present

## 2018-04-02 DIAGNOSIS — Z3A26 26 weeks gestation of pregnancy: Secondary | ICD-10-CM | POA: Diagnosis not present

## 2018-04-02 DIAGNOSIS — O4290 Premature rupture of membranes, unspecified as to length of time between rupture and onset of labor, unspecified weeks of gestation: Secondary | ICD-10-CM

## 2018-04-02 DIAGNOSIS — O10913 Unspecified pre-existing hypertension complicating pregnancy, third trimester: Secondary | ICD-10-CM | POA: Diagnosis not present

## 2018-04-02 DIAGNOSIS — O99214 Obesity complicating childbirth: Secondary | ICD-10-CM | POA: Diagnosis present

## 2018-04-02 DIAGNOSIS — Z3A25 25 weeks gestation of pregnancy: Secondary | ICD-10-CM | POA: Diagnosis not present

## 2018-04-02 DIAGNOSIS — Z3A22 22 weeks gestation of pregnancy: Secondary | ICD-10-CM

## 2018-04-02 DIAGNOSIS — O42913 Preterm premature rupture of membranes, unspecified as to length of time between rupture and onset of labor, third trimester: Secondary | ICD-10-CM | POA: Diagnosis not present

## 2018-04-02 DIAGNOSIS — O24119 Pre-existing diabetes mellitus, type 2, in pregnancy, unspecified trimester: Secondary | ICD-10-CM | POA: Diagnosis present

## 2018-04-02 DIAGNOSIS — O24113 Pre-existing diabetes mellitus, type 2, in pregnancy, third trimester: Secondary | ICD-10-CM | POA: Diagnosis not present

## 2018-04-02 DIAGNOSIS — Z3A28 28 weeks gestation of pregnancy: Secondary | ICD-10-CM | POA: Diagnosis not present

## 2018-04-02 DIAGNOSIS — O411230 Chorioamnionitis, third trimester, not applicable or unspecified: Secondary | ICD-10-CM | POA: Diagnosis present

## 2018-04-02 DIAGNOSIS — O24313 Unspecified pre-existing diabetes mellitus in pregnancy, third trimester: Secondary | ICD-10-CM | POA: Diagnosis not present

## 2018-04-02 DIAGNOSIS — O0993 Supervision of high risk pregnancy, unspecified, third trimester: Secondary | ICD-10-CM | POA: Diagnosis not present

## 2018-04-02 DIAGNOSIS — O4210 Premature rupture of membranes, onset of labor more than 24 hours following rupture, unspecified weeks of gestation: Secondary | ICD-10-CM | POA: Diagnosis not present

## 2018-04-02 DIAGNOSIS — O368390 Maternal care for abnormalities of the fetal heart rate or rhythm, unspecified trimester, not applicable or unspecified: Secondary | ICD-10-CM

## 2018-04-02 DIAGNOSIS — O42113 Preterm premature rupture of membranes, onset of labor more than 24 hours following rupture, third trimester: Secondary | ICD-10-CM | POA: Diagnosis not present

## 2018-04-02 DIAGNOSIS — O0990 Supervision of high risk pregnancy, unspecified, unspecified trimester: Secondary | ICD-10-CM

## 2018-04-02 HISTORY — DX: Essential (primary) hypertension: I10

## 2018-04-02 HISTORY — DX: Vomiting of pregnancy, unspecified: O21.9

## 2018-04-02 LAB — RAPID URINE DRUG SCREEN, HOSP PERFORMED
AMPHETAMINES: NOT DETECTED
BARBITURATES: NOT DETECTED
BENZODIAZEPINES: NOT DETECTED
Cocaine: NOT DETECTED
Opiates: NOT DETECTED
Tetrahydrocannabinol: NOT DETECTED

## 2018-04-02 LAB — COMPREHENSIVE METABOLIC PANEL
ALK PHOS: 56 U/L (ref 38–126)
ALT: 12 U/L (ref 0–44)
AST: 15 U/L (ref 15–41)
Albumin: 2.9 g/dL — ABNORMAL LOW (ref 3.5–5.0)
Anion gap: 7 (ref 5–15)
BUN: 7 mg/dL (ref 6–20)
CALCIUM: 8.6 mg/dL — AB (ref 8.9–10.3)
CO2: 21 mmol/L — AB (ref 22–32)
CREATININE: 0.74 mg/dL (ref 0.44–1.00)
Chloride: 108 mmol/L (ref 98–111)
GFR calc non Af Amer: >60 mL/min (ref >60)
Glucose, Bld: 124 mg/dL — ABNORMAL HIGH (ref 70–99)
Potassium: 3.6 mmol/L (ref 3.5–5.1)
Sodium: 136 mmol/L (ref 135–145)
Total Bilirubin: 0.4 mg/dL (ref 0.3–1.2)
Total Protein: 5.9 g/dL — ABNORMAL LOW (ref 6.5–8.1)

## 2018-04-02 LAB — CBC
HEMATOCRIT: 31.9 % — AB (ref 36.0–46.0)
HEMOGLOBIN: 10.5 g/dL — AB (ref 12.0–15.0)
MCH: 27.7 pg (ref 26.0–34.0)
MCHC: 32.9 g/dL (ref 30.0–36.0)
MCV: 84.2 fL (ref 80.0–100.0)
Platelets: 319 10*3/uL (ref 150–400)
RBC: 3.79 MIL/uL — ABNORMAL LOW (ref 3.87–5.11)
RDW: 13.5 % (ref 11.5–15.5)
WBC: 5.4 10*3/uL (ref 4.0–10.5)
nRBC: 0 % (ref 0.0–0.2)

## 2018-04-02 LAB — TYPE AND SCREEN
ABO/RH(D): A POS
ANTIBODY SCREEN: NEGATIVE

## 2018-04-02 LAB — GLUCOSE, CAPILLARY
Glucose-Capillary: 86 mg/dL (ref 70–99)
Glucose-Capillary: 201 mg/dL — ABNORMAL HIGH (ref 70–99)
Glucose-Capillary: 225 mg/dL — ABNORMAL HIGH (ref 70–99)

## 2018-04-02 LAB — ABO/RH: ABO/RH(D): A POS

## 2018-04-02 LAB — PROTEIN / CREATININE RATIO, URINE
Creatinine, Urine: 187 mg/dL
Protein Creatinine Ratio: 0.08 mg/mg{Cre} (ref 0.00–0.15)
Total Protein, Urine: 15 mg/dL

## 2018-04-02 MED ORDER — AZITHROMYCIN 250 MG PO TABS
500.0000 mg | ORAL_TABLET | Freq: Every day | ORAL | Status: AC
  Administered 2018-04-02 – 2018-04-08 (×7): 500 mg via ORAL
  Filled 2018-04-02 (×7): qty 2

## 2018-04-02 MED ORDER — DOCUSATE SODIUM 100 MG PO CAPS
100.0000 mg | ORAL_CAPSULE | Freq: Every day | ORAL | Status: DC
  Administered 2018-04-03 – 2018-05-18 (×44): 100 mg via ORAL
  Filled 2018-04-02 (×44): qty 1

## 2018-04-02 MED ORDER — ACETAMINOPHEN 325 MG PO TABS
650.0000 mg | ORAL_TABLET | ORAL | Status: DC | PRN
  Administered 2018-04-04 – 2018-05-19 (×11): 650 mg via ORAL
  Filled 2018-04-02 (×14): qty 2

## 2018-04-02 MED ORDER — SODIUM CHLORIDE 0.9 % IV SOLN
INTRAVENOUS | Status: DC | PRN
  Administered 2018-04-02 – 2018-04-03 (×2): 250 mL via INTRAVENOUS

## 2018-04-02 MED ORDER — CALCIUM CARBONATE ANTACID 500 MG PO CHEW
2.0000 | CHEWABLE_TABLET | ORAL | Status: DC | PRN
  Administered 2018-04-29 – 2018-05-17 (×5): 400 mg via ORAL
  Filled 2018-04-02 (×6): qty 2

## 2018-04-02 MED ORDER — AMOXICILLIN 500 MG PO CAPS
500.0000 mg | ORAL_CAPSULE | Freq: Three times a day (TID) | ORAL | Status: AC
  Administered 2018-04-04 – 2018-04-09 (×14): 500 mg via ORAL
  Filled 2018-04-02 (×14): qty 1

## 2018-04-02 MED ORDER — SODIUM CHLORIDE 0.9 % IV SOLN
2.0000 g | Freq: Four times a day (QID) | INTRAVENOUS | Status: AC
  Administered 2018-04-02 – 2018-04-04 (×7): 2 g via INTRAVENOUS
  Filled 2018-04-02 (×3): qty 2
  Filled 2018-04-02: qty 2000
  Filled 2018-04-02: qty 2
  Filled 2018-04-02: qty 2000
  Filled 2018-04-02 (×2): qty 2

## 2018-04-02 MED ORDER — INSULIN ASPART 100 UNIT/ML ~~LOC~~ SOLN: 0.0000 [IU] | Freq: Three times a day (TID) | SUBCUTANEOUS | Status: DC

## 2018-04-02 MED ORDER — PRENATAL MULTIVITAMIN CH
1.0000 | ORAL_TABLET | Freq: Every day | ORAL | Status: DC
  Administered 2018-04-02 – 2018-05-18 (×44): 1 via ORAL
  Filled 2018-04-02 (×43): qty 1

## 2018-04-02 MED ORDER — INFLUENZA VAC SPLIT QUAD 0.5 ML IM SUSY: 0.5000 mL | PREFILLED_SYRINGE | INTRAMUSCULAR | Status: DC

## 2018-04-02 MED ORDER — ZOLPIDEM TARTRATE 5 MG PO TABS: 5.0000 mg | ORAL_TABLET | Freq: Every evening | ORAL | Status: DC | PRN

## 2018-04-02 MED ORDER — ASPIRIN EC 81 MG PO TBEC
81.0000 mg | DELAYED_RELEASE_TABLET | Freq: Every day | ORAL | Status: DC
  Administered 2018-04-03 – 2018-05-18 (×46): 81 mg via ORAL
  Filled 2018-04-02 (×48): qty 1

## 2018-04-02 MED ORDER — INSULIN ASPART 100 UNIT/ML ~~LOC~~ SOLN
0.0000 [IU] | Freq: Every day | SUBCUTANEOUS | Status: DC
  Administered 2018-04-03 (×2): 2 [IU] via SUBCUTANEOUS

## 2018-04-02 MED ORDER — BETAMETHASONE SOD PHOS & ACET 6 (3-3) MG/ML IJ SUSP
12.0000 mg | INTRAMUSCULAR | Status: AC
  Administered 2018-04-02 – 2018-04-03 (×2): 12 mg via INTRAMUSCULAR
  Filled 2018-04-02 (×2): qty 2

## 2018-04-02 MED ORDER — ENOXAPARIN SODIUM 60 MG/0.6ML ~~LOC~~ SOLN
50.0000 mg | SUBCUTANEOUS | Status: DC
  Administered 2018-04-02 – 2018-04-16 (×14): 50 mg via SUBCUTANEOUS
  Filled 2018-04-02 (×15): qty 0.6

## 2018-04-02 NOTE — H&P (Signed)
Obstetric History and Physical  Kathryn Shelton is a 27 y.o. [id number] with IUP at 60w5dpresenting for admission for PPROM on 03/09/18 who opted for expectant management. Patient denies ctx, bleeding. Reports normal fetal movement. Reports intermittent leaking.   Prenatal Course Source of Care: [facility] with onset of care at 9 weeks Pregnancy complications or risks: Patient Active Problem List   Diagnosis Date Noted  . Premature rupture of membranes 04/02/2018  . Oligohydramnios antepartum, second trimester, other fetus 03/09/2018  . Ruptured, membranes, premature 03/09/2018  . Obesity, morbid ([place] 03/09/2018  . Preterm premature rupture of membranes, unspecified as to length of time between rupture and onset of labor, second trimester 03/09/2018  . Nausea and vomiting during pregnancy prior to [redacted] weeks gestation 01/14/2018  . Bacterial vaginitis 01/02/2018  . Supervision of high risk pregnancy, antepartum 12/31/2017  . Chronic hypertension during pregnancy, antepartum 12/31/2017  . Pre-existing type 2 diabetes mellitus during pregnancy, antepartum 12/31/2017  . Mixed hyperlipidemia 12/02/2017  . Type II diabetes mellitus, uncontrolled ([place] 03/01/2017  . Essential hypertension 03/01/2017  . Intermittent palpitations 03/01/2017  . Panic attack 03/01/2017  . Obstructive sleep apnea syndrome 03/01/2017    Prenatal labs and studies: ABO, Rh: A/Positive/-- (10/03 1708) Antibody: Negative (10/03 1708) Rubella: 1.50 (10/03 1708) RPR: Non Reactive (10/03 1708)  HBsAg: Negative (10/03 1708)  HIV: Non Reactive (10/03 1708)  GBS:  2 hr GTT:  N/a Genetic screening normal Anatomy UKoreaanhydramnios  Medical History:  Past Medical History:  Diagnosis Date  . DM (diabetes mellitus), type 2 (HMena   . GERD (gastroesophageal reflux disease)   . Hypertension   . Sleep apnea     No past surgical history on file.  OB History  Gravida Para Term Preterm AB Living  _0 0 0 2  SAB TAB  Ectopic Multiple Live Births  0 0 0 0 2    # Outcome Date GA Lbr Len/2nd Weight Sex Delivery Anes PTL Lv  3 Current           2 Term 12/11/11    F Vag-Spont  N LIV  1 Term 09/04/10    F Vag-Spont   LIV    Social History   Socioeconomic History  . Marital status: Legally Separated    Spouse name: Not on file  . Number of children: 2  . Years of education: [employer] . Highest education level: 12th grade  Occupational History  . Not on file  Social Needs  . Financial resource strain: Not very hard  . Food insecurity:    Worry: Never true    Inability: Never true  . Transportation needs:    Medical: No    Non-medical: No  Tobacco Use  . Smoking status: Never Smoker  . Smokeless tobacco: Never Used  Substance and Sexual Activity  . Alcohol use: No  . Drug use: No  . Sexual activity: Not Currently    Birth control/protection: None  Lifestyle  . Physical activity:    Days per week: Not on file    Minutes per session: Not on file  . Stress: Not at all  Relationships  . Social connections:    Talks on phone: More than three times a week    Gets together: Not on file    Attends religious service: Never    Active member of club or organization: No    Attends meetings of clubs or organizations: Never    Relationship status: Separated  Other Topics Concern  . Not on file  Social History Narrative  . Not on file    Family History  Adopted: Yes  Family history unknown: Yes    Medications Prior to Admission  Medication Sig Dispense Refill Last Dose  . aspirin EC 81 MG tablet Take 1 tablet (81 mg total) by mouth daily. Take after 12 weeks for prevention of preeclampsia later in pregnancy 300 tablet 2 Taking  . Blood Glucose Monitoring Suppl (ACCU-CHEK GUIDE) w/Device KIT 1 Device by Does not apply route 4 (four) times daily. 1 kit 0 Taking  . glucose blood (ACCU-CHEK GUIDE) test strip Use to check blood sugars four times a day was instructed 50 each 12 Taking  .  Insulin Glargine (LANTUS) 100 UNIT/ML Solostar Pen Inject 10 Units into the skin daily at 10 pm. 15 mL 11 Taking  . Prenatal MV & Min w/FA-DHA (PRENATAL ADULT GUMMY/DHA/FA) 0.4-25 MG CHEW Chew 1 tablet by mouth daily. 30 tablet 12 Taking  . ranitidine (ZANTAC) 150 MG tablet Take 1 tablet (150 mg total) by mouth 2 (two) times daily. 60 tablet 2 Taking    Allergies  Allergen Reactions  . Shellfish-Derived Products Swelling    Review of Systems: Negative except for what is mentioned in HPI.  Physical Exam: LMP 10/07/2017 (Approximate)  CONSTITUTIONAL: Well-developed, well-nourished female in no acute distress.  HENT:  Normocephalic, atraumatic, External right and left ear normal. Oropharynx is clear and moist EYES: Conjunctivae and EOM are normal. Pupils are equal, round, and reactive to light. No scleral icterus.  NECK: Normal range of motion, supple, no masses SKIN: Skin is warm and dry. No rash noted. Not diaphoretic. No erythema. No pallor. NEUROLOGIC: Alert and oriented to person, place, and time. Normal reflexes, muscle tone coordination. No cranial nerve deficit noted. PSYCHIATRIC: Normal mood and affect. Normal behavior. Normal judgment and thought content. CARDIOVASCULAR: Normal heart rate noted, regular rhythm RESPIRATORY: Effort and breath sounds normal, no problems with respiration noted ABDOMEN: Soft, nontender, nondistended, gravid. MUSCULOSKELETAL: Normal range of motion. No edema and no tenderness. 2+ distal pulses.  FHT: not taken yet   Pertinent Labs/Studies:   No results found for this or any previous visit (from the past 24 hour(s)).  Assessment : Kathryn Shelton is a 27 y.o. G3P2002 at 4w5dbeing admitted for PPROM on 03/09/18 who opted for expectant management. Now here for admission for viability at 23 weeks. No complaints, occasional leaking, denies other issues. Not checking CBG or taking insulin as prescribed.   Had long discussion with patient regarding  viability and potential c-section. She would like vaginal delivery if possible but affirms she wants everything done for baby. Reviewed risks of c-section including infection, hemorrhage, damage to surrounding tissue and organs, likelihood that she may need classical incision at this early gestation and implications for future pregnancies including risk of not being able to carry a future pregnancy, need for repeat c-sections, placental abnormalities. Answered all questions, she verbalized understanding and again affirms desire that she wants everything done for fetus.   Plan:  PPROM - admit to antepartum - latency abx - BTMZ 11/8-9 - NICU consult - Expectant management - Analgesia as needed  FWB  - NST TID  - MFM growth UK[place]ordered  Chronic HTN - no meds at home - stable - UPCr, CMP today  T2DM - has not been checking CBG at home - CBG fasting, before meals and 2 hrs post prandial - diabetic diet - will start insulin as  indicated    Feliz Beam, M.D. [facility]  04/02/2018, 4:24 PM

## 2018-04-02 NOTE — Progress Notes (Addendum)
CRNA notified to insert IV at 1942. No IV has been started due to pt. Being a hard stick. CRNA will come shortly.    CRNA came and was unable to get a line started. IV team notified @ 2130.

## 2018-04-03 DIAGNOSIS — O24312 Unspecified pre-existing diabetes mellitus in pregnancy, second trimester: Secondary | ICD-10-CM

## 2018-04-03 LAB — GLUCOSE, CAPILLARY
GLUCOSE-CAPILLARY: 163 mg/dL — AB (ref 70–99)
GLUCOSE-CAPILLARY: 183 mg/dL — AB (ref 70–99)
GLUCOSE-CAPILLARY: 242 mg/dL — AB (ref 70–99)
Glucose-Capillary: 229 mg/dL — ABNORMAL HIGH (ref 70–99)
Glucose-Capillary: 188 mg/dL — ABNORMAL HIGH (ref 70–99)
Glucose-Capillary: 128 mg/dL — ABNORMAL HIGH (ref 70–99)

## 2018-04-03 MED ORDER — INSULIN NPH (HUMAN) (ISOPHANE) 100 UNIT/ML ~~LOC~~ SUSP
42.0000 [IU] | Freq: Every day | SUBCUTANEOUS | Status: DC
  Administered 2018-04-03 – 2018-04-04 (×2): 42 [IU] via SUBCUTANEOUS
  Filled 2018-04-03: qty 10

## 2018-04-03 MED ORDER — MAGNESIUM SULFATE BOLUS VIA INFUSION
4.0000 g | Freq: Once | INTRAVENOUS | Status: AC
  Administered 2018-04-03: 4 g via INTRAVENOUS
  Filled 2018-04-03: qty 500

## 2018-04-03 MED ORDER — INSULIN NPH (HUMAN) (ISOPHANE) 100 UNIT/ML ~~LOC~~ SUSP
16.0000 [IU] | Freq: Every day | SUBCUTANEOUS | Status: DC
  Administered 2018-04-03 – 2018-04-04 (×2): 16 [IU] via SUBCUTANEOUS

## 2018-04-03 MED ORDER — MAGNESIUM SULFATE 40 G IN LACTATED RINGERS - SIMPLE
2.0000 g/h | INTRAVENOUS | Status: AC
  Filled 2018-04-03: qty 500

## 2018-04-03 MED ORDER — INSULIN ASPART 100 UNIT/ML ~~LOC~~ SOLN
16.0000 [IU] | Freq: Three times a day (TID) | SUBCUTANEOUS | Status: DC
  Administered 2018-04-03 – 2018-04-06 (×10): 16 [IU] via SUBCUTANEOUS

## 2018-04-03 NOTE — Progress Notes (Signed)
Patient ID: Kathryn Shelton, female   DOB: 08/02/1990, 27 y.o.   MRN: [id number] FACULTY PRACTICE ANTEPARTUM(COMPREHENSIVE) NOTE  Kathryn Shelton is a 27 y.o. G3P2002 at [redacted]w[redacted]d  who is admitted for PROM.    Fetal presentation is cephalic. Length of Stay:  1  Days  Date of admission:04/02/2018  Subjective: Patient reports feeling well. She denies vaginal bleeding or contractions. She reports fetal movement and some leakage of fluid   Vitals:  Blood pressure (!) 134/96, pulse (!) 103, temperature 97.8 F (36.6 C), resp. rate 20, height 5\' 6"  (1.676 m), weight 96.6 kg, last menstrual period 10/07/2017, SpO2 100 %. Vitals:   04/02/18 1738 04/02/18 1946 04/02/18 2310 04/03/18 0338  BP:  122/73 123/84 (!) 134/96  Pulse:  (!) 106 97 (!) 103  Resp:  17 17 20   Temp:  98.5 F (36.9 C) 98.9 F (37.2 C) 97.8 F (36.6 C)  TempSrc:  Oral    SpO2:  100%  100%  Weight: 96.6 kg     Height: 5\' 6"  (1.676 m)      Physical Examination: GENERAL: Well-developed, well-nourished female in no acute distress.  LUNGS: Clear to auscultation bilaterally.  HEART: Regular rate and rhythm. ABDOMEN: Soft, nontender, gravid PELVIC: Not indicated EXTREMITIES: No cyanosis, clubbing, or edema, 2+ distal pulses.   Fetal Monitoring:  Baseline: 150 bpm, Variability: Fair (1-6 bpm), Accelerations: Non-reactive but appropriate for gestational age and Decelerations: Absent    Toco: no contractions  Labs:  Results for orders placed or performed during the hospital encounter of 04/02/18 (from the past 24 hour(s))  CBC   Collection Time: 04/02/18  4:49 PM  Result Value Ref Range   WBC 5.4 4.0 - 10.5 K/uL   RBC 3.79 (L) 3.87 - 5.11 MIL/uL   Hemoglobin 10.5 (L) 12.0 - 15.0 g/dL   HCT 40.9 (L) 81.1 - 91.4 %   MCV 84.2 80.0 - 100.0 fL   MCH 27.7 26.0 - 34.0 pg   MCHC 32.9 30.0 - 36.0 g/dL   RDW 78.2 95.6 - 21.3 %   Platelets 319 150 - 400 K/uL   nRBC 0.0 0.0 - 0.2 %  Comprehensive metabolic panel   Collection Time:  04/02/18  4:49 PM  Result Value Ref Range   Sodium 136 135 - 145 mmol/L   Potassium 3.6 3.5 - 5.1 mmol/L   Chloride 108 98 - 111 mmol/L   CO2 21 (L) 22 - 32 mmol/L   Glucose, Bld 124 (H) 70 - 99 mg/dL   BUN 7 6 - 20 mg/dL   Creatinine, Ser 0.86 0.44 - 1.00 mg/dL   Calcium 8.6 (L) 8.9 - 10.3 mg/dL   Total Protein 5.9 (L) 6.5 - 8.1 g/dL   Albumin 2.9 (L) 3.5 - 5.0 g/dL   AST 15 15 - 41 U/L   ALT 12 0 - 44 U/L   Alkaline Phosphatase 56 38 - 126 U/L   Total Bilirubin 0.4 0.3 - 1.2 mg/dL   GFR calc non Af Amer >60 >60 mL/min   GFR calc Af Amer >60 >60 mL/min   Anion gap 7 5 - 15  Type and screen [facility] [place]   Collection Time: 04/02/18  4:49 PM  Result Value Ref Range   ABO/RH(D) A POS    Antibody Screen NEG    Sample Expiration      04/05/2018 Performed at [facility], [address], [place], [place] [address]   ABO/Rh   Collection Time: 04/02/18  4:49  PM  Result Value Ref Range   ABO/RH(D)      A POS Performed at [facility], [address], [place], [place] [address]   Glucose, capillary   Collection Time: 04/02/18  6:24 PM  Result Value Ref Range   Glucose-Capillary 86 70 - 99 mg/dL  Protein / creatinine ratio, urine   Collection Time: 04/02/18  8:16 PM  Result Value Ref Range   Creatinine, Urine 187.00 mg/dL   Total Protein, Urine 15 mg/dL   Protein Creatinine Ratio 0.08 0.00 - 0.15 mg/mg[Cre]  Urine rapid drug screen (hosp performed)not at [facility]   Collection Time: 04/02/18  8:16 PM  Result Value Ref Range   Opiates NONE DETECTED NONE DETECTED   Cocaine NONE DETECTED NONE DETECTED   Benzodiazepines NONE DETECTED NONE DETECTED   Amphetamines NONE DETECTED NONE DETECTED   Tetrahydrocannabinol NONE DETECTED NONE DETECTED   Barbiturates NONE DETECTED NONE DETECTED  Glucose, capillary   Collection Time: 04/02/18  8:19 PM  Result Value Ref Range   Glucose-Capillary 201 (H) 70 - 99 mg/dL   Comment 1 Notify RN   Glucose, capillary    Collection Time: 04/02/18 11:22 PM  Result Value Ref Range   Glucose-Capillary 225 (H) 70 - 99 mg/dL   Comment 1 Notify RN   Glucose, capillary   Collection Time: 04/03/18  6:52 AM  Result Value Ref Range   Glucose-Capillary 229 (H) 70 - 99 mg/dL   Comment 1 Notify RN     Imaging Studies:      Medications:  Scheduled . [START ON 04/04/2018] amoxicillin  500 mg Oral Q8H  . aspirin EC  81 mg Oral Daily  . azithromycin  500 mg Oral Daily  . betamethasone acetate-betamethasone sodium phosphate  12 mg Intramuscular Q24H  . docusate sodium  100 mg Oral Daily  . enoxaparin (LOVENOX) injection  50 mg Subcutaneous Q24H  . Influenza vac split quadrivalent PF  0.5 mL Intramuscular Tomorrow-1000  . insulin aspart  0-5 Units Subcutaneous QHS  . insulin aspart  16 Units Subcutaneous TID WC  . insulin NPH Human  16 Units Subcutaneous QHS  . insulin NPH Human  42 Units Subcutaneous QAC breakfast  . magnesium  4 g Intravenous Once  . prenatal multivitamin  1 tablet Oral Q1200   I have reviewed the patient's current medications.  ASSESSMENT: [id number] [redacted]w[redacted]d Estimated Date of Delivery: 08/01/18  Patient Active Problem List   Diagnosis Date Noted  . Premature rupture of membranes 04/02/2018  . Oligohydramnios antepartum, second trimester, other fetus 03/09/2018  . Ruptured, membranes, premature 03/09/2018  . Obesity, morbid (HCC) 03/09/2018  . Preterm premature rupture of membranes, unspecified as to length of time between rupture and onset of labor, second trimester 03/09/2018  . Nausea and vomiting during pregnancy prior to [redacted] weeks gestation 01/14/2018  . Bacterial vaginitis 01/02/2018  . Supervision of high risk pregnancy, antepartum 12/31/2017  . Chronic hypertension during pregnancy, antepartum 12/31/2017  . Pre-existing type 2 diabetes mellitus during pregnancy, antepartum 12/31/2017  . Mixed hyperlipidemia 12/02/2017  . Type II diabetes mellitus, uncontrolled (HCC) 03/01/2017  .  Essential hypertension 03/01/2017  . Intermittent palpitations 03/01/2017  . Panic attack 03/01/2017  . Obstructive sleep apnea syndrome 03/01/2017    PLAN: - Patient to receive second dose of BMZ today - Patient will complete magnesium sulfate - Continue latency antibiotics - NICU consult - Diabetes management with insulin - Inpatient monitoring for si/sx of chorioamnionitis  Livi Mcgann 04/03/2018,7:43 AM

## 2018-04-03 NOTE — Progress Notes (Signed)
Patient called nurse into the room having a panic attack stating she has a fever and her heart was beating fast. After sitting patient on side of bed and doing breathing exercises she calmed down. Vital signs were within normal limits with no temperature. Will continue to monitor patient

## 2018-04-04 LAB — GLUCOSE, CAPILLARY
GLUCOSE-CAPILLARY: 125 mg/dL — AB (ref 70–99)
GLUCOSE-CAPILLARY: 128 mg/dL — AB (ref 70–99)
GLUCOSE-CAPILLARY: 139 mg/dL — AB (ref 70–99)
Glucose-Capillary: 120 mg/dL — ABNORMAL HIGH (ref 70–99)
Glucose-Capillary: 146 mg/dL — ABNORMAL HIGH (ref 70–99)

## 2018-04-04 NOTE — Progress Notes (Signed)
Patient ID: Kathryn Shelton, female   DOB: 1990/12/03, 27 y.o.   MRN: [id number] FACULTY PRACTICE ANTEPARTUM(COMPREHENSIVE) NOTE  Kathryn Shelton is a 27 y.o. G3P2002 at [redacted]w[redacted]d by best clinical estimate who is admitted for PROM.   Fetal presentation is cephalic. Length of Stay:  2  Days  Subjective: Feels well. No complaints Patient reports the fetal movement as active. Patient reports uterine contraction  activity as none. Patient reports  vaginal bleeding as none. Patient describes fluid per vagina as Clear.  Vitals:  Blood pressure 121/77, pulse 77, temperature 98.6 F (37 C), temperature source Oral, resp. rate 18, height 5\' 6"  (1.676 m), weight 96.6 kg, last menstrual period 10/07/2017, SpO2 98 %. Physical Examination:  General appearance - alert, well appearing, and in no distress Chest - normal effort Abdomen - gravid, non-tender Fundal Height:  size equals dates Extremities: Homans sign is negative, no sign of DVT  Membranes:ruptured, clear fluid  Fetal Monitoring:  Baseline: 150 bpm, Variability: Good {> 6 bpm), Accelerations: Non-reactive but appropriate for gestational age and Decelerations: Absent  Labs:  Results for orders placed or performed during the hospital encounter of 04/02/18 (from the past 24 hour(s))  Glucose, capillary   Collection Time: 04/03/18 10:43 AM  Result Value Ref Range   Glucose-Capillary 163 (H) 70 - 99 mg/dL  Glucose, capillary   Collection Time: 04/03/18  1:30 PM  Result Value Ref Range   Glucose-Capillary 188 (H) 70 - 99 mg/dL  Glucose, capillary   Collection Time: 04/03/18  4:36 PM  Result Value Ref Range   Glucose-Capillary 128 (H) 70 - 99 mg/dL  Glucose, capillary   Collection Time: 04/03/18  6:24 PM  Result Value Ref Range   Glucose-Capillary 183 (H) 70 - 99 mg/dL  Glucose, capillary   Collection Time: 04/03/18 10:04 PM  Result Value Ref Range   Glucose-Capillary 242 (H) 70 - 99 mg/dL  Glucose, capillary   Collection Time: 04/04/18   7:18 AM  Result Value Ref Range   Glucose-Capillary 120 (H) 70 - 99 mg/dL    Medications:  Scheduled . amoxicillin  500 mg Oral Q8H  . aspirin EC  81 mg Oral Daily  . azithromycin  500 mg Oral Daily  . docusate sodium  100 mg Oral Daily  . enoxaparin (LOVENOX) injection  50 mg Subcutaneous Q24H  . Influenza vac split quadrivalent PF  0.5 mL Intramuscular Tomorrow-1000  . insulin aspart  0-5 Units Subcutaneous QHS  . insulin aspart  16 Units Subcutaneous TID WC  . insulin NPH Human  16 Units Subcutaneous QHS  . insulin NPH Human  42 Units Subcutaneous QAC breakfast  . prenatal multivitamin  1 tablet Oral Q1200   I have reviewed the patient's current medications.  ASSESSMENT: Active Problems:   Premature rupture of membranes   Pre-existing diabetes mellitus affecting pregnancy, antepartum  PLAN: Continue inpatient monitoring S/p BMZ CBGs are improving, continue insulin Continue latency abx Delivery with s/sx's of infection  Reva Bores, MD 04/04/2018,10:04 AM

## 2018-04-05 LAB — GLUCOSE, CAPILLARY
GLUCOSE-CAPILLARY: 93 mg/dL (ref 70–99)
GLUCOSE-CAPILLARY: 100 mg/dL — AB (ref 70–99)
GLUCOSE-CAPILLARY: 129 mg/dL — AB (ref 70–99)
Glucose-Capillary: 72 mg/dL (ref 70–99)
Glucose-Capillary: 81 mg/dL (ref 70–99)
Glucose-Capillary: 85 mg/dL (ref 70–99)
Glucose-Capillary: 109 mg/dL — ABNORMAL HIGH (ref 70–99)
Glucose-Capillary: 96 mg/dL (ref 70–99)

## 2018-04-05 LAB — TYPE AND SCREEN
ABO/RH(D): A POS
ANTIBODY SCREEN: NEGATIVE

## 2018-04-05 MED ORDER — INSULIN NPH (HUMAN) (ISOPHANE) 100 UNIT/ML ~~LOC~~ SUSP
12.0000 [IU] | Freq: Every day | SUBCUTANEOUS | Status: DC
  Administered 2018-04-05: 12 [IU] via SUBCUTANEOUS

## 2018-04-05 MED ORDER — INSULIN NPH (HUMAN) (ISOPHANE) 100 UNIT/ML ~~LOC~~ SUSP
36.0000 [IU] | Freq: Every day | SUBCUTANEOUS | Status: DC
  Administered 2018-04-05 – 2018-04-07 (×3): 36 [IU] via SUBCUTANEOUS
  Filled 2018-04-05: qty 10

## 2018-04-05 NOTE — Progress Notes (Signed)
Patient ID: Kathryn Shelton, female   DOB: 08/08/1990, 27 y.o.   MRN: [id number] ACULTY PRACTICE ANTEPARTUM COMPREHENSIVE PROGRESS NOTE  Kathryn Shelton is a 27 y.o. G3P2002 at [redacted]w[redacted]d  who is admitted for PROM.   Fetal presentation is cephalic. Length of Stay:  3  Days  Subjective: Pt reports that she cannot eat the food here.   Patient reports good fetal movement.  She reports no uterine contractions, no bleeding and no loss of fluid per vagina.  Vitals:  Blood pressure 124/72, pulse 84, temperature 98.7 F (37.1 C), temperature source Oral, resp. rate 18, height 5\' 6"  (1.676 m), weight 96.6 kg, last menstrual period 10/07/2017, SpO2 100 %. Physical Examination: General appearance - alert, well appearing, and in no distress; sleepy- awakened for exam Abdomen - soft, nontender, nondistended, no masses or organomegaly gravid Extremities - peripheral pulses normal, no pedal edema, no clubbing or cyanosis Cervical Exam: Not evaluated. Membranes:ruptured  Fetal Monitoring:  Baseline: 150's bpm, Variability: Good {> 6 bpm), Accelerations: Non-reactive but appropriate for gestational age, Decelerations: Absent and toco: without contractions  Labs:  Results for orders placed or performed during the hospital encounter of 04/02/18 (from the past 24 hour(s))  Glucose, capillary   Collection Time: 04/04/18  7:18 AM  Result Value Ref Range   Glucose-Capillary 120 (H) 70 - 99 mg/dL  Glucose, capillary   Collection Time: 04/04/18 10:51 AM  Result Value Ref Range   Glucose-Capillary 125 (H) 70 - 99 mg/dL  Glucose, capillary   Collection Time: 04/04/18  2:21 PM  Result Value Ref Range   Glucose-Capillary 128 (H) 70 - 99 mg/dL  Glucose, capillary   Collection Time: 04/04/18  6:27 PM  Result Value Ref Range   Glucose-Capillary 139 (H) 70 - 99 mg/dL  Glucose, capillary   Collection Time: 04/04/18  9:55 PM  Result Value Ref Range   Glucose-Capillary 146 (H) 70 - 99 mg/dL    Imaging Studies:     None pending   Medications:  Scheduled . amoxicillin  500 mg Oral Q8H  . aspirin EC  81 mg Oral Daily  . azithromycin  500 mg Oral Daily  . docusate sodium  100 mg Oral Daily  . enoxaparin (LOVENOX) injection  50 mg Subcutaneous Q24H  . Influenza vac split quadrivalent PF  0.5 mL Intramuscular Tomorrow-1000  . insulin aspart  0-5 Units Subcutaneous QHS  . insulin aspart  16 Units Subcutaneous TID WC  . insulin NPH Human  16 Units Subcutaneous QHS  . insulin NPH Human  42 Units Subcutaneous QAC breakfast  . prenatal multivitamin  1 tablet Oral Q1200   I have reviewed the patient's current medications.  ASSESSMENT: Patient Active Problem List   Diagnosis Date Noted  . Pre-existing diabetes mellitus affecting pregnancy, antepartum   . Premature rupture of membranes 04/02/2018  . Oligohydramnios antepartum, second trimester, other fetus 03/09/2018  . Ruptured, membranes, premature 03/09/2018  . Obesity, morbid (HCC) 03/09/2018  . Preterm premature rupture of membranes, unspecified as to length of time between rupture and onset of labor, second trimester 03/09/2018  . Nausea and vomiting during pregnancy prior to [redacted] weeks gestation 01/14/2018  . Bacterial vaginitis 01/02/2018  . Supervision of high risk pregnancy, antepartum 12/31/2017  . Chronic hypertension during pregnancy, antepartum 12/31/2017  . Pre-existing type 2 diabetes mellitus during pregnancy, antepartum 12/31/2017  . Mixed hyperlipidemia 12/02/2017  . Type II diabetes mellitus, uncontrolled (HCC) 03/01/2017  . Essential hypertension 03/01/2017  . Intermittent palpitations 03/01/2017  .  Panic attack 03/01/2017  . Obstructive sleep apnea syndrome 03/01/2017    PLAN: Continue inpatient monitoring S/p BMZ CBGs are gradually improving will continue to watch prior to adjustment to eval for BMZ effect Continue latency abx Delivery with s/sx's of infection F/u US in MFM by 2 weeks Consult by dietary today  Kathryn Shelton 04/05/2018,6:38 AM

## 2018-04-05 NOTE — Progress Notes (Signed)
Inpatient Diabetes Program Recommendations  AACE/ADA: New Consensus Statement on Inpatient Glycemic Control (2015)  Target Ranges:  Prepandial:   less than 140 mg/dL      Peak postprandial:   less than 180 mg/dL (1-2 hours)      Critically ill patients:  140 - 180 mg/dL   Lab Results  Component Value Date   GLUCAP 72 04/05/2018   HGBA1C 8.2 (H) 01/14/2018    Review of Glycemic Control Results for Kathryn Shelton, Kathryn Shelton (MRN [id number]) as of 04/05/2018 09:51  Ref. Range 04/04/2018 14:21 04/04/2018 18:27 04/04/2018 21:55 04/05/2018 08:24  Glucose-Capillary Latest Ref Range: 70 - 99 mg/dL 782 (H) 956 (H) 213 (H) 72   Diabetes history: Type 2 DM Outpatient Diabetes medications: Lantus 10 units QHS Current orders for Inpatient glycemic control: NPH-16 units QHS, 42 units QAM, Novolog 16 units TID, Novolog 0-5 units QHS  Inpatient Diabetes Program Recommendations:    Of note patient received BMZ x2 on 11/8 & 11/9, thus would anticipate insulin needs would increase.  Now that blood glucose seems to be trending down and AM FSBS was 72 mg/dL, consider changing NPH to 34 units QAM, and 12 units QHS.  Spoke with RN and noted inconsistent meal patterns and that patient is struggling to make wise nutritional choices. Was last seen by a diabetes educator on 01/18/18 and was eating cake at this visit. RN at bedside providing education and helping patient with meal choices. Will place consult for dietitian.   Thanks, Lujean Rave, MSN, RNC-OB Diabetes Coordinator [phone] (8a-5p)

## 2018-04-05 NOTE — Progress Notes (Signed)
Initial Nutrition Assessment  DOCUMENTATION CODES:   Obesity unspecified  INTERVENTION:  Gestational diabetic diet Double protein portions  NUTRITION DIAGNOSIS:  Limited adherence to nutrition-related recommendations related to (lack of undersanding of the significance of GDM) as evidenced by other (comment)(lack of compliance to GDM diet).  GOAL:  Other (Comment)(improved diet compliance)  MONITOR:  Labs  REASON FOR ASSESSMENT:  Gestational Diabetes, Consult Diet education  ASSESSMENT:   23 1/7 weeks, Adm with PROM. DMII. Pre-preg weight 102 kg. Hyperemesis in early pregnancy with weight loss. Now with no symptoms of n/v per pt report. History of non complance to medications and diet. Has had multiple episodes of diet education, the last being 01/18/18. Basics of diet were stressed today  with pt and BF. The priimary emphasis was on complance to diet., and consuming foods appropriate and not from the vending machine or high CHO foods from outside. CHO's will be counted by [facility] as long as she is admitted. Worked with pt to try to find menu items to her liking. Allowed double protein portions for satiety. Pt is a rather picky eatter and does not eat adeq amounts of food at meals and then gets hungry and cheats. Have encouraged her to request her snacks as they have not been brought to her room - or she does not like them. BF has good comprehension of diet. It is my impression that this pt is not fully committed to following diet parameters.   Diet Order:   Diet Order            Diet gestational carb mod Room service appropriate? Yes; Fluid consistency: Thin  Diet effective now              EDUCATION NEEDS:   Education needs have been addressed  Skin:  Skin Assessment: Reviewed RN Assessment  Last BM:     Height:   Ht Readings from Last 1 Encounters:  04/02/18 5\' 6"  (1.676 m)    Weight:   Wt Readings from Last 1 Encounters:  04/02/18 96.6 kg    Ideal Body Weight:      BMI:  Body mass index is 34.38 kg/m.  Estimated Nutritional Needs:   Kcal:  2200-2400  Protein:  100-110 g  Fluid:  2.5 L    Elisabeth Cara M.Odis Luster LDN Neonatal Nutrition Support Specialist/RD III Pager [phone]      Phone [phone]

## 2018-04-05 NOTE — Progress Notes (Addendum)
Pt requesting snack. Pt offered her carb-modified bedtime snack. Pt refused and said she would purchase food from vending machine. Pt educated on importance of following diet.

## 2018-04-05 NOTE — Progress Notes (Signed)
Pt called out asking if she would be able to eat "food from the vending machine" instead of hospital provided food.  Pt states she does not like the quality of food provided to her and she would like something different than the options here.  Reasons for the card modified diet ordered discussed.   Pt educated on what a carb modified diet is and appropriate food choices for type 2 DM.  From responses pt does not appear to have a working knowledge of counting carbohydrates or making healthy food choices.    Basics reviewed, pt encouraged to increase protein and limit carbohydrate/sugar intake.  Pt asks "Can I have a oatmeal cookie?"  Better food choice options reviewed with pt.  Pt encouraged to choose vegetables, fruit, and protein for main part of meal.  Pt then states "I think I will have a ham and cheese sandwich."  Choice of meat and cheese validated and pt reminded that if she has bread, she would need to be mindful of what sides she picks.  Pt verbalizes she understands.

## 2018-04-06 ENCOUNTER — Ambulatory Visit (HOSPITAL_COMMUNITY): Admit: 2018-04-06 | Payer: Medicaid Other

## 2018-04-06 LAB — GLUCOSE, CAPILLARY
GLUCOSE-CAPILLARY: 158 mg/dL — AB (ref 70–99)
Glucose-Capillary: 71 mg/dL (ref 70–99)
Glucose-Capillary: 141 mg/dL — ABNORMAL HIGH (ref 70–99)
Glucose-Capillary: 158 mg/dL — ABNORMAL HIGH (ref 70–99)
Glucose-Capillary: 88 mg/dL (ref 70–99)

## 2018-04-06 MED ORDER — INSULIN ASPART 100 UNIT/ML ~~LOC~~ SOLN
0.0000 [IU] | Freq: Three times a day (TID) | SUBCUTANEOUS | Status: DC
  Administered 2018-04-08: 2 [IU] via SUBCUTANEOUS
  Administered 2018-04-08: 3 [IU] via SUBCUTANEOUS

## 2018-04-06 MED ORDER — INSULIN NPH (HUMAN) (ISOPHANE) 100 UNIT/ML ~~LOC~~ SUSP
10.0000 [IU] | Freq: Every day | SUBCUTANEOUS | Status: DC
  Administered 2018-04-06: 10 [IU] via SUBCUTANEOUS

## 2018-04-06 NOTE — Progress Notes (Signed)
Empty meal tray noted to be on bedside table.  Pt states she ate dinner at approx 1650; states she notified staff at that time that she was about to eat, however, nobody came.  POC discussed that blood sugar will be obtained for 2hrs postprandial at 1850 & appropriate dose will be given at that time.  Pt & sig other verb understanding.

## 2018-04-06 NOTE — Progress Notes (Addendum)
Inpatient Diabetes Program Recommendations  AACE/ADA: New Consensus Statement on Inpatient Glycemic Control (2015)  Target Ranges:  Prepandial:   less than 140 mg/dL      Peak postprandial:   less than 180 mg/dL (1-2 hours)      Critically ill patients:  140 - 180 mg/dL   Lab Results  Component Value Date   GLUCAP 71 04/06/2018   HGBA1C 8.2 (H) 01/14/2018    Review of Glycemic Control Results for Kathryn Shelton, Kathryn Shelton (MRN [id number]) as of 04/06/2018 09:32  Ref. Range 04/05/2018 20:31 04/05/2018 22:19 04/06/2018 07:58  Glucose-Capillary Latest Ref Range: 70 - 99 mg/dL 119100 (H) 147129 (H) 71   Diabetes history: Type 2 DM Outpatient Diabetes medications: Lantus 10 units QHS Current orders for Inpatient glycemic control: NPH-12 units QHS, 34 units QAM, Novolog 16 units TID, Novolog 0-5 units QHS  Inpatient Diabetes Program Recommendations:    Blood sugars trending well. Given FSBS of 71 mg/dL, may consider further reducing QHS NPH to 10 units QHS.  Addendum: Spoke with RN regarding difficulty keep up with meals and found that patient needs different correction scale. Consider adding Novolog 0-14 units TID under the diabetic pregnant patient order set and discontinuing glycemic control order set.   Thanks, Lujean RaveLauren Abbagail Scaff, MSN, RNC-OB Diabetes Coordinator [phone] (8a-5p)

## 2018-04-06 NOTE — Progress Notes (Signed)
Dr Adrian BlackwaterStinson called & report given re: most recent BS of 158 & that patient has not had no insulin today d/t not having premeal insulin doses.  Also reported to MD that this RN heard patient ordering "hot dog with everything on it".  MD states to given premeal coverage when patients meal arrives.  Report given to oncoming RN.

## 2018-04-06 NOTE — Progress Notes (Signed)
Patient ID: Kathryn SerOctavia Fontes, female   DOB: 04/06/1991, 27 y.o.   MRN: [id number] ACULTY PRACTICE ANTEPARTUM COMPREHENSIVE PROGRESS NOTE  Kathryn Shelton is a 27 y.o. G3P2002 at 5681w2d  who is admitted for PROM.   Fetal presentation is cephalic. Length of Stay:  4  Days  Subjective: Pt reports that she cannot eat the food here.   Patient reports good fetal movement.  She reports no uterine contractions, no bleeding and no loss of fluid per vagina.  Blood pressure 121/82, pulse 77, temperature 98.2 F (36.8 C), temperature source Oral, resp. rate 18, height 5\' 6"  (1.676 m), weight 96.6 kg, last menstrual period 10/07/2017, SpO2 100 %.  Physical Examination: General appearance - alert, well appearing, and in no distress; Abdomen - soft, nontender, nondistended, no masses or organomegaly gravid Cervical Exam: Not evaluated. Membranes:ruptured  Fetal Monitoring:  Fetal Heart Rate A  Mode External filed at 04/06/2018 0834  Baseline Rate (A) 150 bpm filed at 04/06/2018 0834  Variability <5 BPM, 6-25 BPM filed at 04/06/2018 0834  Accelerations None filed at 04/06/2018 0834  Decelerations Variable filed at 04/06/2018 0834    Labs:         Imaging Studies:    None pending   CBG (last 3)  Recent Labs    04/05/18 2031 04/05/18 2219 04/06/18 0758  GLUCAP 100* 129* 71    Medications:  Scheduled . amoxicillin  500 mg Oral Q8H  . aspirin EC  81 mg Oral Daily  . azithromycin  500 mg Oral Daily  . docusate sodium  100 mg Oral Daily  . enoxaparin (LOVENOX) injection  50 mg Subcutaneous Q24H  . Influenza vac split quadrivalent PF  0.5 mL Intramuscular Tomorrow-1000  . insulin aspart  0-5 Units Subcutaneous QHS  . insulin aspart  16 Units Subcutaneous TID WC  . insulin NPH Human  16 Units Subcutaneous QHS  . insulin NPH Human  42 Units Subcutaneous QAC breakfast  . prenatal multivitamin  1 tablet Oral Q1200   I have reviewed the patient's current medications.  ASSESSMENT:     Patient Active Problem List   Diagnosis Date Noted  . Pre-existing diabetes mellitus affecting pregnancy, antepartum   . Premature rupture of membranes 04/02/2018  . Oligohydramnios antepartum, second trimester, other fetus 03/09/2018  . Ruptured, membranes, premature 03/09/2018  . Obesity, morbid (HCC) 03/09/2018  . Preterm premature rupture of membranes, unspecified as to length of time between rupture and onset of labor, second trimester 03/09/2018  . Nausea and vomiting during pregnancy prior to [redacted] weeks gestation 01/14/2018  . Bacterial vaginitis 01/02/2018  . Supervision of high risk pregnancy, antepartum 12/31/2017  . Chronic hypertension during pregnancy, antepartum 12/31/2017  . Pre-existing type 2 diabetes mellitus during pregnancy, antepartum 12/31/2017  . Mixed hyperlipidemia 12/02/2017  . Type II diabetes mellitus, uncontrolled (HCC) 03/01/2017  . Essential hypertension 03/01/2017  . Intermittent palpitations 03/01/2017  . Panic attack 03/01/2017  . Obstructive sleep apnea syndrome 03/01/2017    PLAN: Continue inpatient monitoring S/p BMZ CBGs are gradually improving , adjustments made per recommendation Continue latency abx Delivery with s/sx's of infection F/u US in MFM by 2 weeks  Adam PhenixArnold, Kay Ricciuti G, MD 04/06/2018

## 2018-04-06 NOTE — Progress Notes (Signed)
[facility] and take [facility]meal blood sugar.  Pt states "I already at lunch."  When she asked what time she is unsure and states "II:00 or 11:30?"  Pt re-educated on need for premeal blood sugar check and insulin to be given.  Pt states understanding.    Diabetic coordinator consulted about timing of pre-meal vs correction insulin at this point.  No insulin to be given at this time.  Blood sugar will be redrawn at 1300, correction will be used at that time to correct sugar if needed.

## 2018-04-07 LAB — GLUCOSE, CAPILLARY
GLUCOSE-CAPILLARY: 66 mg/dL — AB (ref 70–99)
GLUCOSE-CAPILLARY: 68 mg/dL — AB (ref 70–99)
GLUCOSE-CAPILLARY: 94 mg/dL (ref 70–99)
GLUCOSE-CAPILLARY: 65 mg/dL — AB (ref 70–99)
Glucose-Capillary: 104 mg/dL — ABNORMAL HIGH (ref 70–99)
Glucose-Capillary: 67 mg/dL — ABNORMAL LOW (ref 70–99)
Glucose-Capillary: 73 mg/dL (ref 70–99)
Glucose-Capillary: 159 mg/dL — ABNORMAL HIGH (ref 70–99)
Glucose-Capillary: 75 mg/dL (ref 70–99)
Glucose-Capillary: 77 mg/dL (ref 70–99)

## 2018-04-07 MED ORDER — INSULIN NPH (HUMAN) (ISOPHANE) 100 UNIT/ML ~~LOC~~ SUSP
6.0000 [IU] | Freq: Every day | SUBCUTANEOUS | Status: DC
  Administered 2018-04-07 – 2018-04-10 (×4): 6 [IU] via SUBCUTANEOUS

## 2018-04-07 MED ORDER — INSULIN ASPART 100 UNIT/ML ~~LOC~~ SOLN
8.0000 [IU] | Freq: Three times a day (TID) | SUBCUTANEOUS | Status: DC
  Administered 2018-04-07: 8 [IU] via SUBCUTANEOUS

## 2018-04-07 MED ORDER — INSULIN ASPART 100 UNIT/ML ~~LOC~~ SOLN
4.0000 [IU] | Freq: Three times a day (TID) | SUBCUTANEOUS | Status: DC
  Administered 2018-04-07 – 2018-04-10 (×8): 4 [IU] via SUBCUTANEOUS

## 2018-04-07 NOTE — Progress Notes (Signed)
Hypoglycemic Event  CBG: 65 (at 1942, 2 hr pp)  Treatment: Cheree DittoGraham crackers and peanut butter  Symptoms:None  Follow-up CBG: Time: 2015 CBG Result: 75  Possible Reasons for Event: Insulin  Comments/MD notified: Followed hypoglycemia protocol, patient stable, will continue to monitor.    Arva ChafeMeghan E Kristain Hu, RN

## 2018-04-07 NOTE — Progress Notes (Signed)
Inpatient Diabetes Program Recommendations  AACE/ADA: New Consensus Statement on Inpatient Glycemic Control (2015)  Target Ranges:  Prepandial:   less than 140 mg/dL      Peak postprandial:   less than 180 mg/dL (1-2 hours)      Critically ill patients:  140 - 180 mg/dL   Lab Results  Component Value Date   GLUCAP 94 04/07/2018   HGBA1C 8.2 (H) 01/14/2018    Review of Glycemic Control Results for Kathryn Shelton, Kathryn Shelton (MRN [id number]) as of 04/07/2018 08:55  Ref. Range 04/06/2018 21:57 04/07/2018 07:28 04/07/2018 08:07 04/07/2018 08:50  Glucose-Capillary Latest Ref Range: 70 - 99 mg/dL 88 66 (L) 68 (L) 94   Diabetes history:Type 2 DM Outpatient Diabetes medications:Lantus 10 units QHS Current orders for Inpatient glycemic control:NPH-10 units QHS, 34 units QAM, Novolog 16 units TID, Novolog 0-5 units QHS  Inpatient Diabetes Program Recommendations:    Noted hypoglycemic episode of 66 mg/dL this AM. Consider further reducing NPH to 6 units QHS.   May want to consider reducing meal coverage. Patient didn't receive meal coverage yesterday due to difficulties with meal timing and estimation of carb intake. Consider reducing to 8 units TID (assuming that patient actually consumes 50% of a full meal and that BG >80).  Thanks, Lujean RaveLauren Joaquin Knebel, MSN, RNC-OB Diabetes Coordinator [phone] (8a-5p)

## 2018-04-07 NOTE — Progress Notes (Signed)
Fasting CBG of 66 at 0730 reported by NT. Grape juice provided by this RN at [phone]. Informed pt that we would return to recheck CBG in 15 min, so to drink the some juice. This RN back to room at 0752, pt has not taken any of the juice. Informed pt again that we would return in 5 minutes, so please drink the juice provided. Pt states understanding. NT states she will recheck at 0800. Sheryn BisonGordon, Norena Bratton Warner

## 2018-04-07 NOTE — Progress Notes (Signed)
Patient ID: Kathryn Shelton, female   DOB: 1990/10/30, 27 y.o.   MRN: [id number] Adam PhenixArnold, Jontrell Bushong G, MD  Physician  Obstetrics/Gynecology  Progress Notes  Signed  Date of Service:  04/06/2018 10:26 AM          Signed      Expand All Collapse All    Show:Clear all [x] Manual[x] Template[x] Copied  Added by: [x] Adam PhenixArnold, Opal Mckellips G, MD  [] Hover for details Patient ID: Kathryn SerOctavia Tapley, female   DOB: 1990/10/30, 27 y.o.   MRN: [id number] ACULTY PRACTICE ANTEPARTUM COMPREHENSIVE PROGRESS NOTE  Kathryn MelenaOctavia Varneyis a 27 y.o.G3P2002 at 4778w3d who is admitted for PROM.  Fetal presentation iscephalic. Length of Stay:4Days  Subjective: Pt reports that she cannot eat the food here. Patient reports good fetal movement. She reports nouterine contractions, no bleeding and no loss of fluid per vagina. Blood pressure 106/75, pulse (!) 103, temperature 98.3 F (36.8 C), temperature source Oral, resp. rate 18, height 5\' 6"  (1.676 m), weight 96.6 kg, last menstrual period 10/07/2017, SpO2 97 %.  Physical Examination: General appearance -alert, well appearing, and in no distress; Abdomen -soft, nontender, nondistended, no masses or organomegaly gravid Cervical Exam:Not evaluated. Membranes:ruptured  Fetal Monitoring: Fetal Heart Rate A  Mode External filed at 04/07/2018 1040  Baseline Rate (A) 150 bpm filed at 04/07/2018 1040  Variability 6-25 BPM filed at 04/07/2018 1040  Accelerations 10 x 10 filed at 04/07/2018 1040  Decelerations Variable filed at 04/07/2018 1040           Labs:        Imaging Studies: None pending  CBG (last 3)  RecentLabs(last2labs)       Recent Labs    04/05/18 2031 04/05/18 2219 04/06/18 0758  GLUCAP 100* 129* 71      CBG (last 3)  Recent Labs    04/07/18 0850 04/07/18 1109 04/07/18 1348  GLUCAP 94 104* 67*                                                                      I have reviewed the  patient's current medications.  ASSESSMENT:     Patient Active Problem List   Diagnosis Date Noted  . Pre-existing diabetes mellitus affecting pregnancy, antepartum   . Premature rupture of membranes 04/02/2018  . Oligohydramnios antepartum, second trimester, other fetus 03/09/2018  . Ruptured, membranes, premature 03/09/2018  . Obesity, morbid (HCC) 03/09/2018  . Preterm premature rupture of membranes, unspecified as to length of time between rupture and onset of labor, second trimester 03/09/2018  . Nausea and vomiting during pregnancy prior to [redacted] weeks gestation 01/14/2018  . Bacterial vaginitis 01/02/2018  . Supervision of high risk pregnancy, antepartum 12/31/2017  . Chronic hypertension during pregnancy, antepartum 12/31/2017  . Pre-existing type 2 diabetes mellitus during pregnancy, antepartum 12/31/2017  . Mixed hyperlipidemia 12/02/2017  . Type II diabetes mellitus, uncontrolled (HCC) 03/01/2017  . Essential hypertension 03/01/2017  . Intermittent palpitations 03/01/2017  . Panic attack 03/01/2017  . Obstructive sleep apnea syndrome 03/01/2017    PLAN: Continue inpatient monitoring S/p BMZ CBGs aregraduallyimproving, adjustments made per recommendation Continue latency abx Delivery with s/sx's of infection  Adam PhenixArnold, Gabrella Stroh G, MD 04/07/2018

## 2018-04-07 NOTE — Progress Notes (Signed)
NT rechecked CBG at 0800, result was 68. Pt continues to not drink her juice provided. Pt did order breakfast. I asked pt if there is anything that she would prefer to drink rather than juice and explained importance of getting glucose out of hypoglycemic range. Pt states understanding. Ginger ale provided. Will reassess in 15 minutes. Sheryn BisonGordon, Quintasia Theroux Warner

## 2018-04-07 NOTE — Progress Notes (Addendum)
Hypoglycemic Event  CBG: 67 Treatment: graham crackers and peanut butter  Symptoms:none  Follow-up CBG: 1417 CBG Result:73  Possible Reasons for Event: insulin   Comments/MD notified: Debroah LoopArnold - notified by Tressia DanasL. McDaniel, RN -Diabetes Coordinator, see order for change in meal coverage insulin    Windle GuardPotts, Richardson DoppJulie Karen

## 2018-04-08 LAB — GLUCOSE, CAPILLARY
GLUCOSE-CAPILLARY: 52 mg/dL — AB (ref 70–99)
GLUCOSE-CAPILLARY: 134 mg/dL — AB (ref 70–99)
GLUCOSE-CAPILLARY: 179 mg/dL — AB (ref 70–99)
Glucose-Capillary: 38 mg/dL — CL (ref 70–99)
Glucose-Capillary: 77 mg/dL (ref 70–99)
Glucose-Capillary: 99 mg/dL (ref 70–99)
Glucose-Capillary: 140 mg/dL — ABNORMAL HIGH (ref 70–99)
Glucose-Capillary: 82 mg/dL (ref 70–99)

## 2018-04-08 LAB — TYPE AND SCREEN
ABO/RH(D): A POS
Antibody Screen: NEGATIVE

## 2018-04-08 MED ORDER — INSULIN NPH (HUMAN) (ISOPHANE) 100 UNIT/ML ~~LOC~~ SUSP
26.0000 [IU] | Freq: Every day | SUBCUTANEOUS | Status: DC
  Administered 2018-04-08 – 2018-04-09 (×2): 26 [IU] via SUBCUTANEOUS
  Filled 2018-04-08: qty 10

## 2018-04-08 NOTE — Progress Notes (Signed)
Pt refused fetal monitoring at this time. Pt states she will call out for RN when ready to be monitored.

## 2018-04-08 NOTE — Progress Notes (Signed)
Hypoglycemic Event  CBG: 52 and then 38  Treatment: Crackers and peanut butter; after recheck ginger ale (pt refused juice), and grapes given.   Symptoms: asymptomatic   Follow-up CBG: Time: 0708 CBG Result: 38  Possible Reasons for Event: Unknown  Comments/MD notified: Dr. Despina HiddenEure on unit and notified     Everardo PacificField, Mikaele Stecher A

## 2018-04-08 NOTE — Progress Notes (Addendum)
Patient left unit and returned 1 hr later with grocery bags from [employer]. She first stated she went grocery shopping because "she had to eat" and then later stated that she did not actually leave the hospital and her sister brought her the groceries. She refused all hospital food throughout the day and insisted on eating her own food- white bread, pringles, spinach dip, potato chips, cheese, granola bars, and deli meat. Pt was informed that she is not allowed to leave the hospital grounds and was reeducated about her carb modified diet. Dr. Vergie LivingPickens made aware; no new orders.  Pt refuses monitoring at this time.

## 2018-04-08 NOTE — Progress Notes (Signed)
ADA Standards of Care 2019 Diabetes in Pregnancy Target Glucose Ranges:  Fasting: 60 - 90 mg/dL Preprandial: 60 - 132105 mg/dL 1 hr postprandial: Less than 140mg /dL (from first bite of meal) 2 hr postprandial: Less than 120 mg/dL (from first bit of meal)    Results for [name], Reet (MRN [id number]) as of 04/08/2018 08:24  Ref. Range 04/07/2018 07:28 04/07/2018 08:07 04/07/2018 08:50 04/07/2018 11:09 04/07/2018 13:48 04/07/2018 14:17 04/07/2018 16:48 04/07/2018 19:42 04/07/2018 20:15 04/07/2018 22:03  Glucose-Capillary Latest Ref Range: 70 - 99 mg/dL 66 (L) 68 (L) 94   36 units NPH 104 (H)  8 units NOVOLOG  67 (L) 73 159 (H)  4 units NOVOLOG  65 (L) 75 77    6 units NPH   Results for [name], Desira (MRN [id number]) as of 04/08/2018 08:24  Ref. Range 04/08/2018 06:50 04/08/2018 07:08 04/08/2018 07:42  Glucose-Capillary Latest Ref Range: 70 - 99 mg/dL 52 (L) 38 (LL) 425134 (H)     Home DM Meds: Lantus 10 units QHS  Current Orders: NPH Insulin 36 units AM      NPH 6 units QHS      Novolog 4 units TID wit meals      Novolog 0-14 units TID Post-Meals      Note that bedtime dose of NPH insulin reduced last PM.  Also note that Novolog Meal Coverage reduced to 4 units TID last evening.    MD- Please consider the following in-hospital insulin adjustments:  1. Reduce AM dose of NPH Insulin to 26 units QAM with Breakfast (30% reduction)   2. Please also consider adding a 2am CBG check to pt's current CBG regimen.  If we check a 2am CBG, we will be able to see how the bedtime dose of NPH is affecting pt's glucose levels.     --Will follow patient during hospitalization--  Ambrose FinlandJeannine Johnston Hollister Wessler RN, MSN, CDE Diabetes Coordinator Inpatient Glycemic Control Team Team Pager: [phone] (8a-5p)

## 2018-04-08 NOTE — Progress Notes (Signed)
Patient ID: Kathryn SerOctavia Hyneman, female   DOB: 12/30/1990, 27 y.o.   MRN: [id number] FACULTY PRACTICE ANTEPARTUM(COMPREHENSIVE) NOTE  Kathryn Shelton is a 27 y.o. G3P2002 at 653w4d by best clinical estimate who is admitted for PROM.   Fetal presentation is cephalic. Length of Stay:  6  Days  Subjective:  Patient reports the fetal movement as active. Patient reports uterine contraction  activity as none. Patient reports  vaginal bleeding as none. Patient describes fluid per vagina as None.  Vitals:  Blood pressure 114/85, pulse 87, temperature 98.3 F (36.8 C), temperature source Oral, resp. rate 20, height 5\' 6"  (1.676 m), weight 96.6 kg, last menstrual period 10/07/2017, SpO2 100 %. Physical Examination:  General appearance - alert, well appearing, and in no distress Heart - normal rate and regular rhythm Abdomen - soft, nontender, nondistended Fundal Height:  size equals dates Cervical Exam: Not evaluated. . Extremities: extremities normal, atraumatic, no cyanosis or edema and Homans sign is negative, no sign of DVT  Membranes: ruptured  Fetal Monitoring:  Baseline: 150 bpm, Variability: Fair (1-6 bpm), Accelerations: Non-reactive but appropriate for gestational age and Decelerations: Absent  Labs:  Results for orders placed or performed during the hospital encounter of 04/02/18 (from the past 24 hour(s))  Glucose, capillary   Collection Time: 04/07/18  4:48 PM  Result Value Ref Range   Glucose-Capillary 159 (H) 70 - 99 mg/dL  Glucose, capillary   Collection Time: 04/07/18  7:42 PM  Result Value Ref Range   Glucose-Capillary 65 (L) 70 - 99 mg/dL  Glucose, capillary   Collection Time: 04/07/18  8:15 PM  Result Value Ref Range   Glucose-Capillary 75 70 - 99 mg/dL  Glucose, capillary   Collection Time: 04/07/18 10:03 PM  Result Value Ref Range   Glucose-Capillary 77 70 - 99 mg/dL  Glucose, capillary   Collection Time: 04/08/18  6:50 AM  Result Value Ref Range   Glucose-Capillary  52 (L) 70 - 99 mg/dL  Glucose, capillary   Collection Time: 04/08/18  7:08 AM  Result Value Ref Range   Glucose-Capillary 38 (LL) 70 - 99 mg/dL   Comment 1 Notify RN   Glucose, capillary   Collection Time: 04/08/18  7:42 AM  Result Value Ref Range   Glucose-Capillary 134 (H) 70 - 99 mg/dL  Glucose, capillary   Collection Time: 04/08/18 11:40 AM  Result Value Ref Range   Glucose-Capillary 77 70 - 99 mg/dL  Glucose, capillary   Collection Time: 04/08/18  1:16 PM  Result Value Ref Range   Glucose-Capillary 99 70 - 99 mg/dL    Imaging Studies:      Medications:  Scheduled . amoxicillin  500 mg Oral Q8H  . aspirin EC  81 mg Oral Daily  . docusate sodium  100 mg Oral Daily  . enoxaparin (LOVENOX) injection  50 mg Subcutaneous Q24H  . Influenza vac split quadrivalent PF  0.5 mL Intramuscular Tomorrow-1000  . insulin aspart  0-14 Units Subcutaneous TID WC  . insulin aspart  4 Units Subcutaneous TID WC  . [START ON 04/09/2018] insulin NPH Human  26 Units Subcutaneous QAC breakfast  . insulin NPH Human  6 Units Subcutaneous QHS  . prenatal multivitamin  1 tablet Oral Q1200   I have reviewed the patient's current medications.  ASSESSMENT: Patient Active Problem List   Diagnosis Date Noted  . Pre-existing diabetes mellitus affecting pregnancy, antepartum   . Premature rupture of membranes 04/02/2018  . Oligohydramnios antepartum, second trimester, other fetus 03/09/2018  .  Ruptured, membranes, premature 03/09/2018  . Obesity, morbid (HCC) 03/09/2018  . Preterm premature rupture of membranes, unspecified as to length of time between rupture and onset of labor, second trimester 03/09/2018  . Nausea and vomiting during pregnancy prior to [redacted] weeks gestation 01/14/2018  . Bacterial vaginitis 01/02/2018  . Supervision of high risk pregnancy, antepartum 12/31/2017  . Chronic hypertension during pregnancy, antepartum 12/31/2017  . Pre-existing type 2 diabetes mellitus during pregnancy,  antepartum 12/31/2017  . Mixed hyperlipidemia 12/02/2017  . Type II diabetes mellitus, uncontrolled (HCC) 03/01/2017  . Essential hypertension 03/01/2017  . Intermittent palpitations 03/01/2017  . Panic attack 03/01/2017  . Obstructive sleep apnea syndrome 03/01/2017    PLAN: Continue to adjust insulin dose, observation for PTL, infection and fetal well-being  Scheryl Darter 04/08/2018,4:38 PM

## 2018-04-08 NOTE — Progress Notes (Signed)
Attempted to put the patient on the monitor and had a hard time getting picking up the baby. Went to readjust the US and the patient refused further monitoring. Not enough monitoring to establish a baseline.   Arva ChafeMeghan E Lovelace Cerveny, RN

## 2018-04-09 DIAGNOSIS — O42913 Preterm premature rupture of membranes, unspecified as to length of time between rupture and onset of labor, third trimester: Secondary | ICD-10-CM

## 2018-04-09 DIAGNOSIS — O24313 Unspecified pre-existing diabetes mellitus in pregnancy, third trimester: Secondary | ICD-10-CM

## 2018-04-09 DIAGNOSIS — O4102X9 Oligohydramnios, second trimester, other fetus: Secondary | ICD-10-CM

## 2018-04-09 DIAGNOSIS — O0993 Supervision of high risk pregnancy, unspecified, third trimester: Secondary | ICD-10-CM

## 2018-04-09 DIAGNOSIS — O24113 Pre-existing diabetes mellitus, type 2, in pregnancy, third trimester: Secondary | ICD-10-CM

## 2018-04-09 DIAGNOSIS — O42113 Preterm premature rupture of membranes, onset of labor more than 24 hours following rupture, third trimester: Secondary | ICD-10-CM

## 2018-04-09 DIAGNOSIS — O10913 Unspecified pre-existing hypertension complicating pregnancy, third trimester: Secondary | ICD-10-CM

## 2018-04-09 LAB — GLUCOSE, CAPILLARY
GLUCOSE-CAPILLARY: 201 mg/dL — AB (ref 70–99)
GLUCOSE-CAPILLARY: 103 mg/dL — AB (ref 70–99)
Glucose-Capillary: 105 mg/dL — ABNORMAL HIGH (ref 70–99)
Glucose-Capillary: 90 mg/dL (ref 70–99)
Glucose-Capillary: 61 mg/dL — ABNORMAL LOW (ref 70–99)
Glucose-Capillary: 123 mg/dL — ABNORMAL HIGH (ref 70–99)

## 2018-04-09 LAB — CREATININE, SERUM
Creatinine, Ser: 0.74 mg/dL (ref 0.44–1.00)
GFR calc Af Amer: >60 mL/min (ref >60)
GFR calc non Af Amer: >60 mL/min (ref >60)

## 2018-04-09 MED ORDER — INSULIN NPH (HUMAN) (ISOPHANE) 100 UNIT/ML ~~LOC~~ SUSP
18.0000 [IU] | Freq: Every day | SUBCUTANEOUS | Status: DC
  Administered 2018-04-10 – 2018-04-11 (×2): 18 [IU] via SUBCUTANEOUS
  Filled 2018-04-09: qty 10

## 2018-04-09 MED ORDER — INSULIN ASPART 100 UNIT/ML ~~LOC~~ SOLN
0.0000 [IU] | Freq: Three times a day (TID) | SUBCUTANEOUS | Status: DC
  Administered 2018-04-09: 4 [IU] via SUBCUTANEOUS
  Administered 2018-04-10 (×2): 2 [IU] via SUBCUTANEOUS
  Administered 2018-04-10: 1 [IU] via SUBCUTANEOUS

## 2018-04-09 NOTE — Progress Notes (Signed)
Patient ID: Kathryn Shelton, female   DOB: 08-Feb-1991, 27 y.o.   MRN: [id number] ACULTY PRACTICE ANTEPARTUM COMPREHENSIVE PROGRESS NOTE  Kathryn Shelton is a 27 y.o. G3P2002 at [redacted]w[redacted]d  who is admitted for PROM.  Pt with uncontrolled Class B DM Fetal presentation is cephalic. Length of Stay:  7  Days  Subjective: Pt without complaints. Patient reports good fetal movement.  She reports no uterine contractions, no bleeding and no loss of fluid per vagina.  Vitals:  Blood pressure 115/80, pulse 100, temperature 98.7 F (37.1 C), temperature source Oral, resp. rate 20, height 5\' 6"  (1.676 m), weight 96.6 kg, last menstrual period 10/07/2017, SpO2 97 %. Physical Examination: She has potato chips in 3 containers in the room.  General appearance - alert, well appearing, and in no distress and oriented to person, place, and time Abdomen - soft, nontender, nondistended, no masses or organomegaly Gravid Extremities - peripheral pulses normal, no pedal edema, no clubbing or cyanosis Cervical Exam: Not evaluated.  Membranes:intact  Fetal Monitoring:  Baseline: 150's bpm, Variability: Good {> 6 bpm), Accelerations: Non-reactive but appropriate for gestational age and Decelerations: Absent Toco: no contractions  Labs:  Results for orders placed or performed during the hospital encounter of 04/02/18 (from the past 24 hour(s))  Glucose, capillary   Collection Time: 04/08/18 11:40 AM  Result Value Ref Range   Glucose-Capillary 77 70 - 99 mg/dL  Glucose, capillary   Collection Time: 04/08/18  1:16 PM  Result Value Ref Range   Glucose-Capillary 99 70 - 99 mg/dL  Type and screen [facility] [place]   Collection Time: 04/08/18  5:12 PM  Result Value Ref Range   ABO/RH(D) A POS    Antibody Screen NEG    Sample Expiration      04/11/2018 Performed at [facility], [address] [place], [place] [address]   Glucose, capillary   Collection Time: 04/08/18  6:46 PM  Result Value Ref Range    Glucose-Capillary 140 (H) 70 - 99 mg/dL  Glucose, capillary   Collection Time: 04/08/18  7:17 PM  Result Value Ref Range   Glucose-Capillary 179 (H) 70 - 99 mg/dL  Glucose, capillary   Collection Time: 04/08/18 10:03 PM  Result Value Ref Range   Glucose-Capillary 82 70 - 99 mg/dL  Glucose, capillary   Collection Time: 04/09/18  3:02 AM  Result Value Ref Range   Glucose-Capillary 105 (H) 70 - 99 mg/dL  Creatinine, serum   Collection Time: 04/09/18  5:00 AM  Result Value Ref Range   Creatinine, Ser 0.74 0.44 - 1.00 mg/dL   GFR calc non Af Amer >60 >60 mL/min   GFR calc Af Amer >60 >60 mL/min    Imaging Studies:    04/02/2018 Impression  Patient is admitted with the diagnosis PPROM.  On ultrasound, amniotic fluid is decreased. Deepest vertical  pocket measures 2 cm.  Fetal biometry is appropriate for gestational age. Fetal  anatomy appears normal, but limited by oligohydramnios. ---------------------------------------------------------------------- Recommendations  -Follow-up scan in 2 weeks.  Medications:  Scheduled . amoxicillin  500 mg Oral Q8H  . aspirin EC  81 mg Oral Daily  . docusate sodium  100 mg Oral Daily  . enoxaparin (LOVENOX) injection  50 mg Subcutaneous Q24H  . Influenza vac split quadrivalent PF  0.5 mL Intramuscular Tomorrow-1000  . insulin aspart  0-14 Units Subcutaneous TID WC  . insulin aspart  4 Units Subcutaneous TID WC  . insulin NPH Human  26 Units Subcutaneous QAC  breakfast  . insulin NPH Human  6 Units Subcutaneous QHS  . prenatal multivitamin  1 tablet Oral Q1200   I have reviewed the patient's current medications.  ASSESSMENT: Patient Active Problem List   Diagnosis Date Noted  . Pre-existing diabetes mellitus affecting pregnancy, antepartum   . Premature rupture of membranes 04/02/2018  . Oligohydramnios antepartum, second trimester, other fetus 03/09/2018  . Ruptured, membranes, premature 03/09/2018  . Obesity, morbid (HCC)  03/09/2018  . Preterm premature rupture of membranes, unspecified as to length of time between rupture and onset of labor, second trimester 03/09/2018  . Nausea and vomiting during pregnancy prior to [redacted] weeks gestation 01/14/2018  . Bacterial vaginitis 01/02/2018  . Supervision of high risk pregnancy, antepartum 12/31/2017  . Chronic hypertension during pregnancy, antepartum 12/31/2017  . Pre-existing type 2 diabetes mellitus during pregnancy, antepartum 12/31/2017  . Mixed hyperlipidemia 12/02/2017  . Type II diabetes mellitus, uncontrolled (HCC) 03/01/2017  . Essential hypertension 03/01/2017  . Intermittent palpitations 03/01/2017  . Panic attack 03/01/2017  . Obstructive sleep apnea syndrome 03/01/2017    PLAN: Continue inpatient monitoring S/p BMZ s/p latency atbx  CBGs are labile will continue to watch no insulin adjustments at present.  Delivery with s/sx's of infection F/u US in MFM by 1 weeks Re-educated on diet.  Kellyn Mccary Harraway-Smith 04/09/2018,9:29 AM

## 2018-04-09 NOTE — Progress Notes (Addendum)
Inpatient Diabetes Program Recommendations  AACE/ADA: New Consensus Statement on Inpatient Glycemic Control (2015)  Target Ranges:  Prepandial:   less than 140 mg/dL      Peak postprandial:   less than 180 mg/dL (1-2 hours)      Critically ill patients:  140 - 180 mg/dL   Lab Results  Component Value Date   GLUCAP 61 (L) 04/09/2018   HGBA1C 8.2 (H) 01/14/2018    Review of Glycemic Control Results for Kathryn Shelton, [name] (MRN [id number]) as of 04/09/2018 13:54  Ref. Range 04/08/2018 22:03 04/09/2018 03:02 04/09/2018 09:49 04/09/2018 13:14  Glucose-Capillary Latest Ref Range: 70 - 99 mg/dL 82 409105 (H) 90 61 (L)   Diabetes history:Type 2 DM Outpatient Diabetes medications:Lantus 10 units QHS Current orders for Inpatient glycemic control:NPH-6units QHS, 26units QAM, Novolog 4 units TID, Novolog 0-14 units TID  Inpatient Diabetes Program Recommendations:    Noted hypoglycemic episode of 61 mg/dL this afternoon. Consider further reducing NPH to 18 units QAM.   Of note, patient received Novolog 4 units meal coverage; unclear if patient consumed >50% of meal, thus may not have had enough carbohydrate for amt of administered insulin. Spoke with RN regarding this AM breakfast. It remains Unclear of total of intake carbohydrates for this meal. Insulin given at start of eating meal. Parameters added for safety.  Additionally, would recommend switching back CBGs to FSBS and post prandial, as correction (Novolog 0-14 units) should be administered postprandially.  Encouraged to give meal coverage following the patient consuming >50%. Reviewed hypoglycemia protocol.   Thanks, Lujean RaveLauren Emit Kuenzel, MSN, RNC-OB Diabetes Coordinator [phone] (8a-5p)

## 2018-04-10 LAB — CBC
HEMATOCRIT: 31.1 % — AB (ref 36.0–46.0)
Hemoglobin: 10.1 g/dL — ABNORMAL LOW (ref 12.0–15.0)
MCH: 27.7 pg (ref 26.0–34.0)
MCHC: 32.5 g/dL (ref 30.0–36.0)
MCV: 85.4 fL (ref 80.0–100.0)
NRBC: 0 % (ref 0.0–0.2)
Platelets: 331 10*3/uL (ref 150–400)
RBC: 3.64 MIL/uL — AB (ref 3.87–5.11)
RDW: 13.9 % (ref 11.5–15.5)
WBC: 7.2 10*3/uL (ref 4.0–10.5)

## 2018-04-10 LAB — GLUCOSE, CAPILLARY
Glucose-Capillary: 134 mg/dL — ABNORMAL HIGH (ref 70–99)
Glucose-Capillary: 86 mg/dL (ref 70–99)
Glucose-Capillary: 112 mg/dL — ABNORMAL HIGH (ref 70–99)
Glucose-Capillary: 137 mg/dL — ABNORMAL HIGH (ref 70–99)
Glucose-Capillary: 148 mg/dL — ABNORMAL HIGH (ref 70–99)

## 2018-04-10 NOTE — Progress Notes (Signed)
Patient ID: Kathryn Shelton, female   DOB: June 08, 1990, 27 y.o.   MRN: [id number] ACULTY PRACTICE ANTEPARTUM COMPREHENSIVE PROGRESS NOTE  Kathryn Shelton is a 27 y.o. G3P2002 at [redacted]w[redacted]d  who is admitted for PROM wit uncontrolled DM.   Fetal presentation is cephalic. Length of Stay:  8  Days  Subjective: Pt with no complaints.  Patient reports good fetal movement.  She reports no uterine contractions, no bleeding no change in the fluid loss per vagina. No odor. No fever or chills. +   Vitals:  Blood pressure 104/70, pulse 95, temperature 98.6 F (37 C), temperature source Oral, resp. rate 18, height 5\' 6"  (1.676 m), weight 96.6 kg, last menstrual period 10/07/2017, SpO2 99 %. Physical Examination: General appearance - alert, well appearing, and in no distress Abdomen - soft, nontender, nondistended, no masses or organomegaly gravid Extremities - peripheral pulses normal, no pedal edema, no clubbing or cyanosis Cervical Exam: Not evaluated.  Membranes:ruptured  Fetal Monitoring:  Baseline: 150's bpm, Variability: Good {> 6 bpm), Accelerations: Reactive and Decelerations: Absent Toco: without contractions   Labs:  Results for orders placed or performed during the hospital encounter of 04/02/18 (from the past 24 hour(s))  Glucose, capillary   Collection Time: 04/09/18  9:49 AM  Result Value Ref Range   Glucose-Capillary 90 70 - 99 mg/dL  Glucose, capillary   Collection Time: 04/09/18  1:14 PM  Result Value Ref Range   Glucose-Capillary 61 (L) 70 - 99 mg/dL   Comment 1 Notify RN    Comment 2 Document in Chart   Glucose, capillary   Collection Time: 04/09/18  2:09 PM  Result Value Ref Range   Glucose-Capillary 123 (H) 70 - 99 mg/dL   Comment 1 Notify RN    Comment 2 Document in Chart   Glucose, capillary   Collection Time: 04/09/18  8:30 PM  Result Value Ref Range   Glucose-Capillary 201 (H) 70 - 99 mg/dL  Glucose, capillary   Collection Time: 04/09/18 11:27 PM  Result Value Ref  Range   Glucose-Capillary 103 (H) 70 - 99 mg/dL  Glucose, capillary   Collection Time: 04/10/18  2:45 AM  Result Value Ref Range   Glucose-Capillary 134 (H) 70 - 99 mg/dL    Imaging Studies:    none   Medications:  Scheduled . aspirin EC  81 mg Oral Daily  . docusate sodium  100 mg Oral Daily  . enoxaparin (LOVENOX) injection  50 mg Subcutaneous Q24H  . Influenza vac split quadrivalent PF  0.5 mL Intramuscular Tomorrow-1000  . insulin aspart  0-14 Units Subcutaneous TID PC  . insulin aspart  4 Units Subcutaneous TID WC  . insulin NPH Human  18 Units Subcutaneous QAC breakfast  . insulin NPH Human  6 Units Subcutaneous QHS  . prenatal multivitamin  1 tablet Oral Q1200   I have reviewed the patient's current medications.  ASSESSMENT: Patient Active Problem List   Diagnosis Date Noted  . Pre-existing diabetes mellitus affecting pregnancy, antepartum   . Premature rupture of membranes 04/02/2018  . Oligohydramnios antepartum, second trimester, other fetus 03/09/2018  . Ruptured, membranes, premature 03/09/2018  . Obesity, morbid (HCC) 03/09/2018  . Preterm premature rupture of membranes, unspecified as to length of time between rupture and onset of labor, second trimester 03/09/2018  . Nausea and vomiting during pregnancy prior to [redacted] weeks gestation 01/14/2018  . Bacterial vaginitis 01/02/2018  . Supervision of high risk pregnancy, antepartum 12/31/2017  . Chronic hypertension during pregnancy, antepartum 12/31/2017  .  Pre-existing type 2 diabetes mellitus during pregnancy, antepartum 12/31/2017  . Mixed hyperlipidemia 12/02/2017  . Type II diabetes mellitus, uncontrolled (HCC) 03/01/2017  . Essential hypertension 03/01/2017  . Intermittent palpitations 03/01/2017  . Panic attack 03/01/2017  . Obstructive sleep apnea syndrome 03/01/2017  Glucose 61-201 no true 'fasting' glucose due to eating patterns.   PLAN: Continue inpatient monitoring S/p BMZ s/p latency atbx  CBGs  arelabilewillcontinuetowatch no insulin adjustments at present.  Delivery with s/sx's of infection F/u US in MFM by 1 weeks. I have reviewed with pt the risks of elevated glucose to the pregnancy and the long term health of her and her child.  Pt expresses understanding.    Xochitl Egle Harraway-Smith 04/10/2018,6:02 AM

## 2018-04-11 DIAGNOSIS — O4210 Premature rupture of membranes, onset of labor more than 24 hours following rupture, unspecified weeks of gestation: Secondary | ICD-10-CM

## 2018-04-11 LAB — GLUCOSE, CAPILLARY
GLUCOSE-CAPILLARY: 146 mg/dL — AB (ref 70–99)
GLUCOSE-CAPILLARY: 48 mg/dL — AB (ref 70–99)
GLUCOSE-CAPILLARY: 93 mg/dL (ref 70–99)
GLUCOSE-CAPILLARY: 43 mg/dL — AB (ref 70–99)
GLUCOSE-CAPILLARY: 95 mg/dL (ref 70–99)
Glucose-Capillary: 64 mg/dL — ABNORMAL LOW (ref 70–99)
Glucose-Capillary: 189 mg/dL — ABNORMAL HIGH (ref 70–99)
Glucose-Capillary: 38 mg/dL — CL (ref 70–99)
Glucose-Capillary: 75 mg/dL (ref 70–99)
Glucose-Capillary: 187 mg/dL — ABNORMAL HIGH (ref 70–99)

## 2018-04-11 LAB — TYPE AND SCREEN
ABO/RH(D): A POS
Antibody Screen: NEGATIVE

## 2018-04-11 MED ORDER — INSULIN ASPART 100 UNIT/ML ~~LOC~~ SOLN
5.0000 [IU] | Freq: Three times a day (TID) | SUBCUTANEOUS | Status: DC
  Administered 2018-04-11 – 2018-04-14 (×8): 5 [IU] via SUBCUTANEOUS

## 2018-04-11 MED ORDER — INSULIN ASPART 100 UNIT/ML ~~LOC~~ SOLN
3.0000 [IU] | Freq: Once | SUBCUTANEOUS | Status: AC
  Administered 2018-04-11: 3 [IU] via SUBCUTANEOUS

## 2018-04-11 MED ORDER — GLUCOSE 40 % PO GEL
ORAL | Status: AC
  Administered 2018-04-11: 16:00:00
  Filled 2018-04-11: qty 1

## 2018-04-11 MED ORDER — INSULIN NPH (HUMAN) (ISOPHANE) 100 UNIT/ML ~~LOC~~ SUSP
4.0000 [IU] | Freq: Every day | SUBCUTANEOUS | Status: DC
  Administered 2018-04-11 – 2018-04-12 (×2): 4 [IU] via SUBCUTANEOUS

## 2018-04-11 MED ORDER — INSULIN NPH (HUMAN) (ISOPHANE) 100 UNIT/ML ~~LOC~~ SUSP: 21.0000 [IU] | Freq: Every day | SUBCUTANEOUS | Status: DC

## 2018-04-11 NOTE — Progress Notes (Signed)
Patient ID: Kathryn Shelton, female   DOB: Feb 26, 1991, 27 y.o.   MRN: [id number] FACULTY PRACTICE ANTEPARTUM(COMPREHENSIVE) NOTE  Kathryn SerOctavia Cortright is a 27 y.o. G3P2002 at 486w0d by best clinical estimate who is admitted for PROM.   Fetal presentation is cephalic. Length of Stay:  9  Days  Subjective: Feels well. Low fasting this am. Patient reports the fetal movement as active. Patient reports uterine contraction  activity as none. Patient reports  vaginal bleeding as none. Patient describes fluid per vagina as Clear.  Vitals:  Blood pressure 121/61, pulse (!) 102, temperature 98.3 F (36.8 C), temperature source Oral, resp. rate 17, height 5\' 6"  (1.676 m), weight 96.6 kg, last menstrual period 10/07/2017, SpO2 100 %. Physical Examination:  General appearance - alert, well appearing, and in no distress Chest - normal effort Abdomen - gravid, non-tender Fundal Height:  size equals dates Extremities: Homans sign is negative, no sign of DVT  Membranes:intact  Fetal Monitoring:  Baseline: 150 bpm, Variability: Good {> 6 bpm), Accelerations: Non-reactive but appropriate for gestational age and Decelerations: Variable: mild  Labs:  Results for orders placed or performed during the hospital encounter of 04/02/18 (from the past 24 hour(s))  CBC   Collection Time: 04/10/18 12:54 PM  Result Value Ref Range   WBC 7.2 4.0 - 10.5 K/uL   RBC 3.64 (L) 3.87 - 5.11 MIL/uL   Hemoglobin 10.1 (L) 12.0 - 15.0 g/dL   HCT 82.931.1 (L) 56.236.0 - 13.046.0 %   MCV 85.4 80.0 - 100.0 fL   MCH 27.7 26.0 - 34.0 pg   MCHC 32.5 30.0 - 36.0 g/dL   RDW 86.513.9 78.411.5 - 69.615.5 %   Platelets 331 150 - 400 K/uL   nRBC 0.0 0.0 - 0.2 %  Glucose, capillary   Collection Time: 04/10/18  5:36 PM  Result Value Ref Range   Glucose-Capillary 137 (H) 70 - 99 mg/dL  Glucose, capillary   Collection Time: 04/10/18 10:14 PM  Result Value Ref Range   Glucose-Capillary 148 (H) 70 - 99 mg/dL  Glucose, capillary   Collection Time: 04/11/18   1:57 AM  Result Value Ref Range   Glucose-Capillary 64 (L) 70 - 99 mg/dL  Glucose, capillary   Collection Time: 04/11/18  2:26 AM  Result Value Ref Range   Glucose-Capillary 146 (H) 70 - 99 mg/dL  Glucose, capillary   Collection Time: 04/11/18  8:44 AM  Result Value Ref Range   Glucose-Capillary 48 (L) 70 - 99 mg/dL  Glucose, capillary   Collection Time: 04/11/18  9:30 AM  Result Value Ref Range   Glucose-Capillary 93 70 - 99 mg/dL  Glucose, capillary   Collection Time: 04/11/18 11:17 AM  Result Value Ref Range   Glucose-Capillary 189 (H) 70 - 99 mg/dL     Medications:  Scheduled . aspirin EC  81 mg Oral Daily  . docusate sodium  100 mg Oral Daily  . enoxaparin (LOVENOX) injection  50 mg Subcutaneous Q24H  . Influenza vac split quadrivalent PF  0.5 mL Intramuscular Tomorrow-1000  . insulin aspart  5 Units Subcutaneous TID WC  . [START ON 04/12/2018] insulin NPH Human  21 Units Subcutaneous QAC breakfast  . insulin NPH Human  4 Units Subcutaneous QHS  . prenatal multivitamin  1 tablet Oral Q1200   I have reviewed the patient's current medications.  ASSESSMENT: Active Problems:   Premature rupture of membranes   Pre-existing diabetes mellitus affecting pregnancy, antepartum   PLAN: Adjusted insulin today Continue inpatient monitoring Delivery  with s/sx's of infection.  Reva Bores, MD 04/11/2018,11:29 AM

## 2018-04-11 NOTE — Progress Notes (Signed)
Inpatient Diabetes Program Recommendations  Diabetes Treatment Program Recommendations  ADA Standards of Care 2018 Diabetes in Pregnancy Target Glucose Ranges:  Fasting: 60 - 90 mg/dL Preprandial: 60 - 161105 mg/dL 1 hr postprandial: Less than 140mg /dL (from first bite of meal) 2 hr postprandial: Less than 120 mg/dL (from first bite of meal)   Results for [name], Phynix (MRN [id number]) as of 04/11/2018 09:06  Ref. Range 04/10/2018 08:03 04/10/2018 10:17 04/10/2018 17:36 04/10/2018 22:14 04/11/2018 01:57 04/11/2018 02:26 04/11/2018 08:44  Glucose-Capillary Latest Ref Range: 70 - 99 mg/dL 86 811112 (H) 914137 (H) 782148 (H) 64 (L) 146 (H) 48 (L)   Review of Glycemic Control  Current orders for Inpatient glycemic control: NPH 18 units QAM, NPH 6 units QHS, Novolog 0-14 units TID (2 hour post prandial), Novolog 4 units TID with meals  Inpatient Diabetes Program Recommendations:  Insulin - Basal: Please consider discontining evening dose of NPH for now. Will follow daily while inpatient and make further recommendations if needed.  Thanks, Orlando PennerMarie Leahna Hewson, RN, MSN, CDE Diabetes Coordinator Inpatient Diabetes Program [phone] (Team Pager from 8am to 5pm)

## 2018-04-11 NOTE — Progress Notes (Addendum)
08:44 CBG: 48 Pt has breakfast tray on way to room.  Will re-check CBG 15 minutes after she eats breakfast. Pt received breakfast tray at 08:55 09:00: Spoke with diabetes coordinator.  Told to hold 4 units of novolog and the novolog sliding scale. Told to re-check CBG 15 minutes after meal. 09:30: CBG 93

## 2018-04-11 NOTE — Progress Notes (Addendum)
Hypoglycemic Event  CBG: 43 @1542  CBG: 38 @ 1558  Treatment: graham crackers provided at 1545 2 tubes of glucose gel given at 1604  Symptoms: lethargic, irritable  Follow-up CBG: Time: 1628 CBG Result: 75  Possible Reasons for Event: unknown  Comments/MD notified: Dr. Shawnie PonsPratt notified about CBG's and actions taken; no new orders at this time.    Laury AxonJennifer K Mamta Rimmer

## 2018-04-11 NOTE — Progress Notes (Addendum)
Hypoglycemic Event  CBG: 64  Treatment: Graham crackers and peanut butter  Symptoms: None  Follow-up CBG: Time: 2:15 am   CBG Result: 146   After result I asked what she ate other than graham crackers and patient stated an oatmeal cookie. Patient was educated on not consuming excess sugar. Patient noncompliant at the time.  Possible Reasons for Event: Unknown    Comments/MD notified: Followed hypoglycemia protocol, Patient stable   August AlbinoHannah  Dalina Samara, RN

## 2018-04-11 NOTE — Progress Notes (Signed)
Pt has refused to be put on the monitor all morning. Pt states she will call out when she is ready to be put on.

## 2018-04-12 DIAGNOSIS — O42112 Preterm premature rupture of membranes, onset of labor more than 24 hours following rupture, second trimester: Secondary | ICD-10-CM

## 2018-04-12 DIAGNOSIS — O24319 Unspecified pre-existing diabetes mellitus in pregnancy, unspecified trimester: Secondary | ICD-10-CM

## 2018-04-12 LAB — URINALYSIS, ROUTINE W REFLEX MICROSCOPIC
Bilirubin Urine: NEGATIVE
Glucose, UA: NEGATIVE mg/dL
HGB URINE DIPSTICK: NEGATIVE
Ketones, ur: NEGATIVE mg/dL
LEUKOCYTES UA: NEGATIVE
NITRITE: NEGATIVE
Protein, ur: NEGATIVE mg/dL
SPECIFIC GRAVITY, URINE: 1.013 (ref 1.005–1.030)
pH: 6 (ref 5.0–8.0)

## 2018-04-12 LAB — GLUCOSE, CAPILLARY
GLUCOSE-CAPILLARY: 103 mg/dL — AB (ref 70–99)
GLUCOSE-CAPILLARY: 167 mg/dL — AB (ref 70–99)
GLUCOSE-CAPILLARY: 129 mg/dL — AB (ref 70–99)
GLUCOSE-CAPILLARY: 136 mg/dL — AB (ref 70–99)
Glucose-Capillary: 74 mg/dL (ref 70–99)
Glucose-Capillary: 130 mg/dL — ABNORMAL HIGH (ref 70–99)

## 2018-04-12 MED ORDER — INSULIN NPH (HUMAN) (ISOPHANE) 100 UNIT/ML ~~LOC~~ SUSP
15.0000 [IU] | Freq: Every day | SUBCUTANEOUS | Status: DC
  Administered 2018-04-12: 15 [IU] via SUBCUTANEOUS

## 2018-04-12 NOTE — Progress Notes (Signed)
Pt called out to report patient felt "hot on the inside but not on the outside."  Patient stated that the tylenol given previously helped "a little bit". She reports that she is unable to sleep due to this feeling but refused further interventions for sleep or "hot on the inside" feeling.Patient has no fundal tenderness and a repeat temperature was 99.1 F. She is warm and dry to the touch.    Patient also reported her urine was "frothy white bubbles". Patient reported no odor from urine. Patient encouraged to save urine next time for nurse to observe.

## 2018-04-12 NOTE — Progress Notes (Signed)
Patient ID: Kathryn Shelton, female   DOB: 31-Jul-1990, 27 y.o.   MRN: [id number] FACULTY PRACTICE ANTEPARTUM(COMPREHENSIVE) NOTE  Kathryn Shelton is a 27 y.o. G3P2002 at [redacted]w[redacted]d by best clinical estimate who is admitted for PROM.   Fetal presentation is cephalic. Length of Stay:  10  Days  Subjective: Feels hot on the inside. No obvious fever. Did not sleep well last pm. Episode of hypoglycemia yesterday after she received extra insulin for hyperglycemia (185 after breakfast-->3 units given, then meal coverage). Post meals is usually too high. In the 180+ range, as it was with dinner last pm. Also reports that her urine was frothy, unclear as to why. Patient reports the fetal movement as active. Patient reports uterine contraction  activity as none. Patient reports  vaginal bleeding as none. Patient describes fluid per vagina as Clear.  Vitals:  Blood pressure (!) 97/56, pulse 85, temperature 99.1 F (37.3 C), temperature source Oral, resp. rate 20, height 5\' 6"  (1.676 m), weight 96.6 kg, last menstrual period 10/07/2017, SpO2 100 %. Physical Examination:  General appearance - alert, well appearing, and in no distress Chest - normal effort Abdomen - gravid, non-tender Fundal Height:  size equals dates Extremities: Homans sign is negative, no sign of DVT  Membranes:ruptured, clear fluid  Fetal Monitoring:  Baseline: 150 bpm, Variability: Good {> 6 bpm), Accelerations: Non-reactive but appropriate for gestational age and Decelerations: Variable: mild  Labs:  Results for orders placed or performed during the hospital encounter of 04/02/18 (from the past 24 hour(s))  Glucose, capillary   Collection Time: 04/11/18 11:17 AM  Result Value Ref Range   Glucose-Capillary 189 (H) 70 - 99 mg/dL  Glucose, capillary   Collection Time: 04/11/18  3:42 PM  Result Value Ref Range   Glucose-Capillary 43 (LL) 70 - 99 mg/dL  Glucose, capillary   Collection Time: 04/11/18  3:58 PM  Result Value Ref Range    Glucose-Capillary 38 (LL) 70 - 99 mg/dL  Glucose, capillary   Collection Time: 04/11/18  4:28 PM  Result Value Ref Range   Glucose-Capillary 75 70 - 99 mg/dL  Type and screen [facility] [place]   Collection Time: 04/11/18  4:45 PM  Result Value Ref Range   ABO/RH(D) A POS    Antibody Screen NEG    Sample Expiration      04/14/2018 Performed at [facility], [address], [place], [place] [address]   Glucose, capillary   Collection Time: 04/11/18  7:29 PM  Result Value Ref Range   Glucose-Capillary 187 (H) 70 - 99 mg/dL  Glucose, capillary   Collection Time: 04/11/18 10:33 PM  Result Value Ref Range   Glucose-Capillary 95 70 - 99 mg/dL  Glucose, capillary   Collection Time: 04/12/18  2:34 AM  Result Value Ref Range   Glucose-Capillary 74 70 - 99 mg/dL    Medications:  Scheduled . aspirin EC  81 mg Oral Daily  . docusate sodium  100 mg Oral Daily  . enoxaparin (LOVENOX) injection  50 mg Subcutaneous Q24H  . Influenza vac split quadrivalent PF  0.5 mL Intramuscular Tomorrow-1000  . insulin aspart  5 Units Subcutaneous TID WC  . insulin NPH Human  15 Units Subcutaneous QAC breakfast  . insulin NPH Human  4 Units Subcutaneous QHS  . prenatal multivitamin  1 tablet Oral Q1200   I have reviewed the patient's current medications.  ASSESSMENT: Active Problems:   Premature rupture of membranes   Pre-existing diabetes mellitus affecting pregnancy, antepartum  PLAN: Will hold all additional short acting insulin and make changes on a day to day basis based on what she was the day before. Suspect she will need the 21 u NPH to help with meal coverage, as we will hold the SS coverage going forward. Will adjust back down if needed. Check cath ua today Watch fever Delivery with s/sx's of infection.  Kathryn Boresanya S Elmore Hyslop, MD 04/12/2018,9:42 AM

## 2018-04-12 NOTE — Progress Notes (Signed)
Patient refusing EFM at this time.  Will ask again later about doing morning NST.

## 2018-04-12 NOTE — Progress Notes (Signed)
Inpatient Diabetes Program Recommendations  Diabetes Treatment Program Recommendations  ADA Standards of Care 2019 Diabetes in Pregnancy Target Glucose Ranges:  Fasting: 60 - 90 mg/dL Preprandial: 60 - 829105 mg/dL 1 hr postprandial: Less than 140mg /dL (from first bite of meal) 2 hr postprandial: Less than 120 mg/dL (from first bite of meal)   Results for Kathryn Shelton, Talaysia (MRN [id number]) as of 04/12/2018 08:23  Ref. Range 04/11/2018 08:44 04/11/2018 09:30 04/11/2018 11:17 04/11/2018 13:27 04/11/2018 15:42 04/11/2018 15:58 04/11/2018 16:28 04/11/2018 19:29 04/11/2018 22:33 04/12/2018 02:34  Glucose-Capillary Latest Ref Range: 70 - 99 mg/dL 48 (L) 93  NPH 18 units 189 (H)  Novolog 3 units (SSI)   Novolog 5 units (meal cov w/lunch) 43 (LL) 38 (LL) 75 187 (H) 95  NPH 4 units @ 22:47 74   Results for Kathryn Shelton, [name] (MRN [id number]) as of 04/11/2018 09:06  Ref. Range 04/10/2018 08:03 04/10/2018 10:17 04/10/2018 15:35 04/10/2018 17:36 04/10/18 20:22 04/10/2018 22:14  Glucose-Capillary Latest Ref Range: 70 - 99 mg/dL 86  Novolog 4 unit (meal cov)  NPH 18 units   112 (H)  Novolog 1 unit     Novolog 4 units (meal cov) 137 (H)  Novolog 2 units (SSI)   Novolog 4 units (meal cov) 148 (H)  Novolog 2 units (SSI)  NPH 6 units   Review of Glycemic Control  Current orders for Inpatient glycemic control: NPH 21 units QAM, NPH 4 units QHS, Novolog 5 units TID with meals  Inpatient Diabetes Program Recommendations:   Insulin - Basal: Noted morning NPH dose increased to 21 units. Patient only received NPH 18 units on 04/11/18 and experienced hypoglycemia. Please decrease morning NPH to 15 units QAM.  Insulin-Meal Coverage: Patient only received meal coverage with lunch on 04/11/18 and experienced hypoglycemia after lunch (within 2 1/2 hours after getting meal coverage). NURSING: Please make sure patient eats at least 50% of meals before giving meal coverage.  Thanks, Orlando PennerMarie Noell Lorensen,  RN, MSN, CDE Diabetes Coordinator Inpatient Diabetes Program [phone] (Team Pager from 8am to 5pm)

## 2018-04-13 DIAGNOSIS — Z3A24 24 weeks gestation of pregnancy: Secondary | ICD-10-CM

## 2018-04-13 DIAGNOSIS — O42912 Preterm premature rupture of membranes, unspecified as to length of time between rupture and onset of labor, second trimester: Principal | ICD-10-CM

## 2018-04-13 LAB — GLUCOSE, CAPILLARY
GLUCOSE-CAPILLARY: 106 mg/dL — AB (ref 70–99)
Glucose-Capillary: 95 mg/dL (ref 70–99)
Glucose-Capillary: 91 mg/dL (ref 70–99)
Glucose-Capillary: 229 mg/dL — ABNORMAL HIGH (ref 70–99)
Glucose-Capillary: 88 mg/dL (ref 70–99)
Glucose-Capillary: 128 mg/dL — ABNORMAL HIGH (ref 70–99)
Glucose-Capillary: 207 mg/dL — ABNORMAL HIGH (ref 70–99)

## 2018-04-13 MED ORDER — INSULIN GLARGINE 100 UNIT/ML ~~LOC~~ SOLN
15.0000 [IU] | Freq: Every day | SUBCUTANEOUS | Status: DC
  Administered 2018-04-13 – 2018-04-19 (×7): 15 [IU] via SUBCUTANEOUS
  Filled 2018-04-13 (×7): qty 0.15

## 2018-04-13 MED ORDER — INSULIN GLARGINE 100 UNIT/ML ~~LOC~~ SOLN
5.0000 [IU] | Freq: Every day | SUBCUTANEOUS | Status: DC
  Administered 2018-04-13 – 2018-04-18 (×6): 5 [IU] via SUBCUTANEOUS
  Filled 2018-04-13 (×6): qty 0.05

## 2018-04-13 NOTE — Progress Notes (Addendum)
CBG of 229 @1040  done at the incorrect time due to clock in room being incorrect. CBG to be rechecked at 2hr post meal.

## 2018-04-13 NOTE — Consult Note (Signed)
[facility]  [facility] 04/13/2018    9:23 PM  Neonatal Medicine Consultation         Jule SerOctavia Blankenbaker          MRN:  [id number]  I was called at the request of the patient's obstetrician (Dr. Macon LargeAnyanwu) to speak to this patient due to expected premature delivery as early as [redacted] weeks gestation.  The patient is 24 2/7 weeks currently, based on best estimates.  [facility] when she reached the recommended date for initiating betamethasone treatment (22 5/7 weeks), which she got on 11/8 and 11/9.  Other treatment given has been latency antibiotics, magnesium sulfate, and insulin (she is a type II diabetic).  The baby is a female.  I reviewed expectations for a baby born at 24+ weeks, including survival, length of stay, morbidities such as respiratory distress, IVH, infection (sepsis, meningitis, NEC), feeding intolerance.   I described how we provide respiratory and feeding support.  Mom does not appear to want to breast feed, but I encouraged her that this would be best for the baby (with formula supplementations for needed calories).  I let mom know that the baby's outlook generally improves the longer she remains undelivered.  As far as survival statistics, the [employer] data suggests for a 24-week female survival of approximately 60%-70% for 600-700 gram babies, but only 30%-40% survival without moderate-severe neurodevelopmental impairment.  For a 25-week 700 gram female, the survival is about 80%, or 55% without moderate-severe impairment.  This fetus has a less optimistic outlook due to the long period of ruptured membranes and low amniotic fluid volume.  I spent 20 minutes reviewing the record, speaking to the patient, and entering appropriate documentation.  More than 50% of the time was spent face to face with patient.   _____________________ Electronically Signed By: Angelita InglesMcCrae S. Rehman Levinson, MD Neonatologist

## 2018-04-13 NOTE — Progress Notes (Signed)
Patient ID: Kathryn Shelton, female   DOB: August 13, 1990, 27 y.o.   MRN: [id number] FACULTY PRACTICE ANTEPARTUM(COMPREHENSIVE) NOTE  Kathryn Shelton is a 27 y.o. G3P2002 at [redacted]w[redacted]d by best clinical estimate who is admitted for PROM.   Fetal presentation is cephalic. Length of Stay:  11  Days  Subjective: She is not happy with current restricted diet, keeps eating food from outside.  Denies any abdominal pain. Patient reports the fetal movement as active. Patient reports uterine contraction  activity as none. Patient reports  vaginal bleeding as none. Patient describes fluid per vagina as clear.  Vitals:  Blood pressure 96/66, pulse 89, temperature 99.3 F (37.4 C), temperature source Oral, resp. rate 20, height 5\' 6"  (1.676 m), weight 96.6 kg, last menstrual period 10/07/2017, SpO2 100 %. Physical Examination:  General appearance - alert, well appearing, and in no distress Chest - normal effort Abdomen - gravid, non-tender Fundal Height:  size equals dates Extremities: Homans sign is negative, no sign of DVT  Membranes:ruptured, clear fluid  Fetal Monitoring:  Baseline: 150 bpm, Variability: Good {> 6 bpm), Accelerations: Non-reactive but appropriate for gestational age and Decelerations: Variable: mild  Labs:  Results for orders placed or performed during the hospital encounter of 04/02/18 (from the past 24 hour(s))  Glucose, capillary   Collection Time: 04/12/18  9:43 AM  Result Value Ref Range   Glucose-Capillary 130 (H) 70 - 99 mg/dL  Urinalysis, Routine w reflex microscopic   Collection Time: 04/12/18  1:28 PM  Result Value Ref Range   Color, Urine YELLOW YELLOW   APPearance CLEAR CLEAR   Specific Gravity, Urine 1.013 1.005 - 1.030   pH 6.0 5.0 - 8.0   Glucose, UA NEGATIVE NEGATIVE mg/dL   Hgb urine dipstick NEGATIVE NEGATIVE   Bilirubin Urine NEGATIVE NEGATIVE   Ketones, ur NEGATIVE NEGATIVE mg/dL   Protein, ur NEGATIVE NEGATIVE mg/dL   Nitrite NEGATIVE NEGATIVE   Leukocytes, UA NEGATIVE NEGATIVE  Glucose, capillary   Collection Time: 04/12/18  2:17 PM  Result Value Ref Range   Glucose-Capillary 103 (H) 70 - 99 mg/dL  Glucose, capillary   Collection Time: 04/12/18  4:25 PM  Result Value Ref Range   Glucose-Capillary 167 (H) 70 - 99 mg/dL  Glucose, capillary   Collection Time: 04/12/18  6:14 PM  Result Value Ref Range   Glucose-Capillary 129 (H) 70 - 99 mg/dL  Glucose, capillary   Collection Time: 04/12/18 11:01 PM  Result Value Ref Range   Glucose-Capillary 136 (H) 70 - 99 mg/dL  Glucose, capillary   Collection Time: 04/13/18  2:08 AM  Result Value Ref Range   Glucose-Capillary 95 70 - 99 mg/dL  Glucose, capillary   Collection Time: 04/13/18  8:34 AM  Result Value Ref Range   Glucose-Capillary 91 70 - 99 mg/dL   Imaging [place] Mfm Ob Follow Up  Result Date: 04/03/2018 ----------------------------------------------------------------------  OBSTETRICS REPORT                       (Signed Final 04/03/2018 09:30 am) ---------------------------------------------------------------------- Patient Info  ID #:       [id number]                          D.O.B.:  1990/06/19 (27 yrs)  Name:       Kathryn Shelton                  Visit Date: 04/02/2018 05:30 pm ---------------------------------------------------------------------- Performed  By  Performed By:     Marcellina Millin RDM      Ref. Address:     [address]                                                             [address]  Attending:        Noralee Space MD        Location:         [facility]  Referred By:      Conan Bowens                    MD ---------------------------------------------------------------------- Orders   #  Description                          Code         Ordered By   1  [place] MFM OB FOLLOW UP                  [id number]     KELLY DAVIS  ----------------------------------------------------------------------   #  Order #                    Accession #                 Episode #   1   [id number]                  [id number]                  [id number]  ---------------------------------------------------------------------- Indications   [redacted] weeks gestation of pregnancy                Z3A.22   Hypertension - Chronic/Pre-existing (no        O10.019   meds)   Pre-existing diabetes, type 2, in pregnancy,   O24.112   second trimester (metformin, insulin, not   currently taking either)   Premature rupture of membranes - leaking       O42.90   fluid  ---------------------------------------------------------------------- Fetal Evaluation  Num Of Fetuses:         1  Fetal Heart Rate(bpm):  142  Cardiac Activity:       Observed  Presentation:           Cephalic  Placenta:               Anterior  P. Cord Insertion:      Previously Visualized  Amniotic Fluid  AFI FV:      Oligohydramnios  AFI Sum(cm)     %Tile       Largest Pocket(cm)  3.53            < 3         2.2  RUQ(cm)                     LUQ(cm)  1.33                        2.2 ---------------------------------------------------------------------- Biometry  BPD:      56.3  mm     G. Age:  23w 1d  65  %    CI:        76.26   %    70 - 86                                                          FL/HC:      19.5   %    19.2 - 20.8  HC:      204.3  mm     G. Age:  22w 4d         30  %    HC/AC:      1.09        1.05 - 1.21  AC:      187.3  mm     G. Age:  23w 4d         67  %    FL/BPD:     70.7   %    71 - 87  FL:       39.8  mm     G. Age:  22w 6d         43  %    FL/AC:      21.2   %    20 - 24  Est. FW:     567  gm      1 lb 4 oz     58  % ---------------------------------------------------------------------- OB History  Gravidity:    3         Term:   2        Prem:   0        SAB:   0  TOP:          0       Ectopic:  0        Living: 2 ---------------------------------------------------------------------- Gestational Age  LMP:           25w 2d        Date:  10/07/17                 EDD:   07/14/18  U/S Today:     23w 0d                                         EDD:   07/30/18  Best:          22w 5d     Det. By:  Marcella Dubs         EDD:   08/01/18                                      (12/22/17) ---------------------------------------------------------------------- Anatomy  Cranium:               Appears normal         Aortic Arch:            Previously seen  Cavum:                 Previously seen        Ductal Arch:            Previously  seen  Ventricles:            Appears normal         Diaphragm:              Appears normal  Choroid Plexus:        Appears normal         Stomach:                Appears normal, left                                                                        sided  Cerebellum:            Previously seen        Abdomen:                Appears normal  Posterior Fossa:       Previously seen        Abdominal Wall:         Not well visualized  Nuchal Fold:           Previously seen        Cord Vessels:           Appears normal (3                                                                        vessel cord)  Face:                  Orbits nl; profile not Kidneys:                Previously seen                         well visualized  Lips:                  Not well visualized    Bladder:                Appears normal  Thoracic:              Appears normal         Spine:                  Not well visualized  Heart:                 Previously seen        Upper Extremities:      Previously seen  RVOT:                  Appears normal         Lower Extremities:      Previously seen  LVOT:                  Appears normal  Other:  Technically difficult due to low amniotic fluid. ---------------------------------------------------------------------- Doppler - Fetal Vessels  Umbilical  Artery   S/D     %tile                                            ADFV    RDFV  3.58       46                                                No      No ---------------------------------------------------------------------- Cervix Uterus Adnexa  Cervix  Not  adaquately visualized  Adnexa  No abnormality visualized. ---------------------------------------------------------------------- Impression  Patient is admitted with the diagnosis PPROM.  On ultrasound, amniotic fluid is decreased. Deepest vertical  pocket measures 2 cm.  Fetal biometry is appropriate for gestational age. Fetal  anatomy appears normal, but limited by oligohydramnios. ---------------------------------------------------------------------- Recommendations  -Follow-up scan in 2 weeks. ----------------------------------------------------------------------                  Noralee Spaceavi Shankar, MD Electronically Signed Final Report   04/03/2018 09:30 am ----------------------------------------------------------------------   Medications:  Scheduled . aspirin EC  81 mg Oral Daily  . docusate sodium  100 mg Oral Daily  . enoxaparin (LOVENOX) injection  50 mg Subcutaneous Q24H  . Influenza vac split quadrivalent PF  0.5 mL Intramuscular Tomorrow-1000  . insulin aspart  5 Units Subcutaneous TID WC  . insulin glargine  15 Units Subcutaneous QAC breakfast  . insulin glargine  5 Units Subcutaneous QHS  . prenatal multivitamin  1 tablet Oral Q1200   I have reviewed the patient's current medications.  ASSESSMENT: Active Problems:   Premature rupture of membranes   Pre-existing diabetes mellitus affecting pregnancy, antepartum   PLAN: After evaluation of her blood sugars and eating habits, regular diet ordered for patient. We can follow her CBGs and manage accordingly. Hopefully, this will help with hospital diet adherence. Check CBGs fasting and 2 hours postprandial (4 times a day) Long acting insulin type changed from MPH to Lantus; On Lantus 15/5, Novolog 5/5/5. Follow up AFI scan ordered for this week. Reassuring FHR tracing, s/p betamethasone, s/p latency antibiotics, s/p magnesium sulfate Neonatology attending consulted, talked to Dr. Eric FormWimmer today Delivery indicated for signs/symptoms of  infection, or other concerning maternal-fetal condition. Continue routine antepartum care    Jaynie CollinsUgonna Anyanwu, MD 04/13/2018,8:54 AM

## 2018-04-13 NOTE — Progress Notes (Signed)
Pt declined to be placed on the fetal monitor @1600  stating she wanted to eat dinner first. After dinner, RN reassessed and again requested to complete the 30min of fetal monitoring. Pt declined again and stated she will call when ready.

## 2018-04-14 LAB — TYPE AND SCREEN
ABO/RH(D): A POS
Antibody Screen: NEGATIVE

## 2018-04-14 LAB — GLUCOSE, CAPILLARY
GLUCOSE-CAPILLARY: 170 mg/dL — AB (ref 70–99)
Glucose-Capillary: 76 mg/dL (ref 70–99)
Glucose-Capillary: 82 mg/dL (ref 70–99)
Glucose-Capillary: 141 mg/dL — ABNORMAL HIGH (ref 70–99)

## 2018-04-14 LAB — CREATININE, SERUM: CREATININE: 0.82 mg/dL (ref 0.44–1.00)

## 2018-04-14 LAB — CBC WITH DIFFERENTIAL/PLATELET
BASOS PCT: 0 %
Basophils Absolute: 0 10*3/uL (ref 0.0–0.1)
EOS ABS: 0.1 10*3/uL (ref 0.0–0.5)
EOS PCT: 1 %
HCT: 34.2 % — ABNORMAL LOW (ref 36.0–46.0)
Hemoglobin: 11.1 g/dL — ABNORMAL LOW (ref 12.0–15.0)
Lymphocytes Relative: 26 %
Lymphs Abs: 1.6 10*3/uL (ref 0.7–4.0)
MCH: 27.6 pg (ref 26.0–34.0)
MCHC: 32.5 g/dL (ref 30.0–36.0)
MCV: 85.1 fL (ref 80.0–100.0)
MONO ABS: 0.3 10*3/uL (ref 0.1–1.0)
Monocytes Relative: 5 %
Neutro Abs: 4.1 10*3/uL (ref 1.7–7.7)
Neutrophils Relative %: 68 %
PLATELETS: 306 10*3/uL (ref 150–400)
RBC: 4.02 MIL/uL (ref 3.87–5.11)
RDW: 13.9 % (ref 11.5–15.5)
WBC: 6.1 10*3/uL (ref 4.0–10.5)
nRBC: 0 % (ref 0.0–0.2)

## 2018-04-14 MED ORDER — INSULIN ASPART 100 UNIT/ML ~~LOC~~ SOLN
8.0000 [IU] | Freq: Three times a day (TID) | SUBCUTANEOUS | Status: DC
  Administered 2018-04-14 – 2018-04-16 (×5): 8 [IU] via SUBCUTANEOUS

## 2018-04-14 NOTE — Progress Notes (Signed)
Patient refused fetal monitoring at this time and stated she would let me know when she was ready later.

## 2018-04-14 NOTE — Progress Notes (Addendum)
Nurse was not notified patient ate lunch at 1145. Post Parandial 2hr due but insulin not given with lunch. Pt reports eating, "chicken and most of my mashed potatoes but not my corn."  Spoke with Diabetes coordinator now who said do not give the lunchtime insulin as not to drop the patient low.

## 2018-04-14 NOTE — Progress Notes (Signed)
I offered support to [facility] as well as FOB and his daughter (age 803).  Lajoyce CornersOctavia reports that she has peace of mind right now.  She is optimistic that her baby, Kathryn Shelton, will do well. She has a strong faith and she is leaving this in God's hands and that is helping her through this.  She has a 566 and a 29 year old daughter as well who are with their grandma on their dad's side.    We will continue to check in on her, but please also page as needs arise.  Chaplain Dyanne CarrelKaty Ossie Beltran, Bcc Pager, [phone] 1:50 PM    04/14/18 1300  Clinical Encounter Type  Visited With Patient and family together  Visit Type Spiritual support  Referral From Nurse  Spiritual Encounters  Spiritual Needs Emotional

## 2018-04-14 NOTE — Progress Notes (Signed)
Patient ID: Kathryn Shelton, female   DOB: February 14, 1991, 27 y.o.   MRN: [id number] FACULTY PRACTICE ANTEPARTUM(COMPREHENSIVE) NOTE  Kathryn Shelton is a 27 y.o. G3P2002 at [redacted]w[redacted]d by best clinical estimate who is admitted for PROM.   Fetal presentation is cephalic. Length of Stay:  12  Days  Subjective: No complaints. Patient reports the fetal movement as active. Patient reports uterine contraction  activity as none. Patient reports  vaginal bleeding as none. Patient describes fluid per vagina as clear.  Vitals:  Blood pressure (!) 94/59, pulse 95, temperature 98.2 F (36.8 C), temperature source Oral, resp. rate 18, height 5\' 6"  (1.676 m), weight 96.6 kg, last menstrual period 10/07/2017, SpO2 100 %. Physical Examination:  General appearance - alert, well appearing, and in no distress Chest - normal effort Abdomen - gravid, non-tender Fundal Height:  size equals dates Extremities: Homans sign is negative, no sign of DVT  Membranes:ruptured, clear fluid  Fetal Monitoring:  Baseline: 150 bpm, Variability: Good {> 6 bpm), Accelerations: Non-reactive but appropriate for gestational age and Decelerations: Variable: mild  Labs:  Results for orders placed or performed during the hospital encounter of 04/02/18 (from the past 24 hour(s))  Glucose, capillary   Collection Time: 04/13/18 10:40 AM  Result Value Ref Range   Glucose-Capillary 229 (H) 70 - 99 mg/dL  Glucose, capillary   Collection Time: 04/13/18 11:45 AM  Result Value Ref Range   Glucose-Capillary 106 (H) 70 - 99 mg/dL  Glucose, capillary   Collection Time: 04/13/18  3:05 PM  Result Value Ref Range   Glucose-Capillary 88 70 - 99 mg/dL  Glucose, capillary   Collection Time: 04/13/18  7:12 PM  Result Value Ref Range   Glucose-Capillary 128 (H) 70 - 99 mg/dL  Glucose, capillary   Collection Time: 04/13/18  9:54 PM  Result Value Ref Range   Glucose-Capillary 207 (H) 70 - 99 mg/dL  Glucose, capillary   Collection Time: 04/14/18   4:50 AM  Result Value Ref Range   Glucose-Capillary 76 70 - 99 mg/dL  CBC with Differential/Platelet   Collection Time: 04/14/18  5:40 AM  Result Value Ref Range   WBC 6.1 4.0 - 10.5 K/uL   RBC 4.02 3.87 - 5.11 MIL/uL   Hemoglobin 11.1 (L) 12.0 - 15.0 g/dL   HCT 47.8 (L) 29.5 - 62.1 %   MCV 85.1 80.0 - 100.0 fL   MCH 27.6 26.0 - 34.0 pg   MCHC 32.5 30.0 - 36.0 g/dL   RDW 30.8 65.7 - 84.6 %   Platelets 306 150 - 400 K/uL   nRBC 0.0 0.0 - 0.2 %   Neutrophils Relative % 68 %   Neutro Abs 4.1 1.7 - 7.7 K/uL   Lymphocytes Relative 26 %   Lymphs Abs 1.6 0.7 - 4.0 K/uL   Monocytes Relative 5 %   Monocytes Absolute 0.3 0.1 - 1.0 K/uL   Eosinophils Relative 1 %   Eosinophils Absolute 0.1 0.0 - 0.5 K/uL   Basophils Relative 0 %   Basophils Absolute 0.0 0.0 - 0.1 K/uL  Creatinine, serum   Collection Time: 04/14/18  5:40 AM  Result Value Ref Range   Creatinine, Shelton 0.82 0.44 - 1.00 mg/dL   GFR calc non Af Amer >60 >60 mL/min   GFR calc Af Amer >60 >60 mL/min  Type and screen   Collection Time: 04/14/18  5:45 AM  Result Value Ref Range   ABO/RH(D) A POS    Antibody Screen NEG    Sample  Expiration      04/17/2018 Performed at [facility], [address], [place], [place] [address]    Imaging [place] Mfm Ob Follow Up  Result Date: 04/03/2018 ----------------------------------------------------------------------  OBSTETRICS REPORT                       (Signed Final 04/03/2018 09:30 am) ---------------------------------------------------------------------- Patient Info  ID #:       [id number]                          D.O.B.:  July 11, 1990 (27 yrs)  Name:       Kathryn Shelton                  Visit Date: 04/02/2018 05:30 pm ---------------------------------------------------------------------- Performed By  Performed By:     Marcellina Millin RDM      Ref. Address:     [address]                                                             [address]  Attending:        Noralee Space MD         Location[facility]         [facility]  Referred By:      Conan Bowens                    MD ---------------------------------------------------------------------- Orders   #  Description                          Code         Ordered By   1  [place] MFM OB FOLLOW UP                  [phone]     KELLY DAVIS  ----------------------------------------------------------------------   #  Order #                    Accession #                 Episode #   1  [id number]                  [id number]                  [id number]  ---------------------------------------------------------------------- Indications   [redacted] weeks gestation of pregnancy                Z3A.22   Hypertension - Chronic/Pre-existing (no        O10.019   meds)   Pre-existing diabetes, type 2, in pregnancy,   O24.112   second trimester (metformin, insulin, not   currently taking either)   Premature rupture of membranes - leaking       O42.90   fluid  ---------------------------------------------------------------------- Fetal Evaluation  Num Of Fetuses:         1  Fetal Heart Rate(bpm):  142  Cardiac Activity:       Observed  Presentation:           Cephalic  Placenta:               Anterior  P. Cord Insertion:      Previously Visualized  Amniotic Fluid  AFI FV:      Oligohydramnios  AFI Sum(cm)     %Tile       Largest Pocket(cm)  3.53            < 3         2.2  RUQ(cm)                     LUQ(cm)  1.33                        2.2 ---------------------------------------------------------------------- Biometry  BPD:      56.3  mm     G. Age:  23w 1d         65  %    CI:        76.26   %    70 - 86                                                          FL/HC:      19.5   %    19.2 - 20.8  HC:      204.3  mm     G. Age:  22w 4d         30  %    HC/AC:      1.09        1.05 - 1.21  AC:      187.3  mm     G. Age:  23w 4d         67  %    FL/BPD:     70.7   %    71 - 87  FL:       39.8  mm     G. Age:  22w 6d         43  %    FL/AC:      21.2   %    20 - 24  Est. FW:      567  gm      1 lb 4 oz     58  % ---------------------------------------------------------------------- OB History  Gravidity:    3         Term:   2        Prem:   0        SAB:   0  TOP:          0       Ectopic:  0        Living: 2 ---------------------------------------------------------------------- Gestational Age  LMP:           25w 2d        Date:  10/07/17                 EDD:   07/14/18  U/S Today:     23w 0d                                        EDD:   07/30/18  Best:          Maudie Mercury 5d  Det. By:  Marcella DubsEarly Ultrasound         EDD:   08/01/18                                      (12/22/17) ---------------------------------------------------------------------- Anatomy  Cranium:               Appears normal         Aortic Arch:            Previously seen  Cavum:                 Previously seen        Ductal Arch:            Previously seen  Ventricles:            Appears normal         Diaphragm:              Appears normal  Choroid Plexus:        Appears normal         Stomach:                Appears normal, left                                                                        sided  Cerebellum:            Previously seen        Abdomen:                Appears normal  Posterior Fossa:       Previously seen        Abdominal Wall:         Not well visualized  Nuchal Fold:           Previously seen        Cord Vessels:           Appears normal (3                                                                        vessel cord)  Face:                  Orbits nl; profile not Kidneys:                Previously seen                         well visualized  Lips:                  Not well visualized    Bladder:                Appears normal  Thoracic:              Appears normal  Spine:                  Not well visualized  Heart:                 Previously seen        Upper Extremities:      Previously seen  RVOT:                  Appears normal         Lower Extremities:      Previously seen  LVOT:                   Appears normal  Other:  Technically difficult due to low amniotic fluid. ---------------------------------------------------------------------- Doppler - Fetal Vessels  Umbilical Artery   S/D     %tile                                            ADFV    RDFV  3.58       46                                                No      No ---------------------------------------------------------------------- Cervix Uterus Adnexa  Cervix  Not adaquately visualized  Adnexa  No abnormality visualized. ---------------------------------------------------------------------- Impression  Patient is admitted with the diagnosis PPROM.  On ultrasound, amniotic fluid is decreased. Deepest vertical  pocket measures 2 cm.  Fetal biometry is appropriate for gestational age. Fetal  anatomy appears normal, but limited by oligohydramnios. ---------------------------------------------------------------------- Recommendations  -Follow-up scan in 2 weeks. ----------------------------------------------------------------------                  Noralee Space, MD Electronically Signed Final Report   04/03/2018 09:30 am ----------------------------------------------------------------------   Medications:  Scheduled . aspirin EC  81 mg Oral Daily  . docusate sodium  100 mg Oral Daily  . enoxaparin (LOVENOX) injection  50 mg Subcutaneous Q24H  . Influenza vac split quadrivalent PF  0.5 mL Intramuscular Tomorrow-1000  . insulin aspart  8 Units Subcutaneous TID WC  . insulin glargine  15 Units Subcutaneous QAC breakfast  . insulin glargine  5 Units Subcutaneous QHS  . prenatal multivitamin  1 tablet Oral Q1200   I have reviewed the patient's current medications.  ASSESSMENT: Principal Problem:   Preterm premature rupture of membranes, unspecified as to length of time between rupture and onset of labor, second trimester Active Problems:   Pre-existing type 2 diabetes mellitus during pregnancy, antepartum   PLAN: Contine  current regimen of Lantus 15/5, increased Novolog to 8/8/8 for better meal coverage. Follow up AFI scan ordered for this week. Reassuring FHR tracing, s/p betamethasone, s/p latency antibiotics, s/p magnesium sulfate S/p Neonatology attending consult, appreciate their recommendations Reassuring FHT tracing Delivery with signs/symptoms of infection or other concerning maternal-fetal condition Continue inpatient management and routine antepartum care.    Jaynie Collins, MD 04/14/2018,10:28 AM

## 2018-04-15 LAB — GLUCOSE, CAPILLARY
GLUCOSE-CAPILLARY: 78 mg/dL (ref 70–99)
GLUCOSE-CAPILLARY: 182 mg/dL — AB (ref 70–99)
Glucose-Capillary: 185 mg/dL — ABNORMAL HIGH (ref 70–99)
Glucose-Capillary: 46 mg/dL — ABNORMAL LOW (ref 70–99)
Glucose-Capillary: 131 mg/dL — ABNORMAL HIGH (ref 70–99)

## 2018-04-15 MED ORDER — INSULIN ASPART 100 UNIT/ML ~~LOC~~ SOLN
0.0000 [IU] | Freq: Three times a day (TID) | SUBCUTANEOUS | Status: DC
  Administered 2018-04-15 – 2018-04-16 (×2): 4 [IU] via SUBCUTANEOUS
  Administered 2018-04-16 – 2018-04-17 (×3): 2 [IU] via SUBCUTANEOUS
  Administered 2018-04-17 – 2018-04-18 (×3): 4 [IU] via SUBCUTANEOUS
  Administered 2018-04-18: 2 [IU] via SUBCUTANEOUS
  Administered 2018-04-19 (×2): 6 [IU] via SUBCUTANEOUS
  Administered 2018-04-20: 4 [IU] via SUBCUTANEOUS
  Administered 2018-04-20 – 2018-04-21 (×2): 2 [IU] via SUBCUTANEOUS
  Administered 2018-04-21 (×2): 3 [IU] via SUBCUTANEOUS
  Administered 2018-04-22: 4 [IU] via SUBCUTANEOUS
  Administered 2018-04-22: 6 [IU] via SUBCUTANEOUS
  Administered 2018-04-23: 3 [IU] via SUBCUTANEOUS
  Administered 2018-04-23 – 2018-04-25 (×3): 2 [IU] via SUBCUTANEOUS
  Administered 2018-04-25: 3 [IU] via SUBCUTANEOUS
  Administered 2018-04-26: 4 [IU] via SUBCUTANEOUS
  Administered 2018-04-26: 2 [IU] via SUBCUTANEOUS
  Administered 2018-04-27 (×2): 3 [IU] via SUBCUTANEOUS
  Administered 2018-04-27: 2 [IU] via SUBCUTANEOUS
  Administered 2018-04-28: 3 [IU] via SUBCUTANEOUS
  Administered 2018-04-28: 8 [IU] via SUBCUTANEOUS
  Administered 2018-04-29 (×2): 4 [IU] via SUBCUTANEOUS
  Administered 2018-04-30 (×2): 3 [IU] via SUBCUTANEOUS
  Administered 2018-05-01: 2 [IU] via SUBCUTANEOUS
  Administered 2018-05-01 – 2018-05-02 (×3): 3 [IU] via SUBCUTANEOUS
  Administered 2018-05-03 – 2018-05-04 (×3): 6 [IU] via SUBCUTANEOUS
  Administered 2018-05-05 (×2): 3 [IU] via SUBCUTANEOUS
  Administered 2018-05-06: 2 [IU] via SUBCUTANEOUS

## 2018-04-15 NOTE — Progress Notes (Signed)
CRITICAL VALUE ALERT  Critical Value:  CBG 46  Date & Time Notied:  04/15/18 @ 1130  Provider Notified: Dr. Macon LargeAnyanwu  Orders Received/Actions taken: graham crackers and juice given. Pt non-compliant and eating multiple packs of graham crackers. CBG recheck at 1141 78, pt feeling better.

## 2018-04-15 NOTE — Progress Notes (Signed)
CSW left a bag for in MOB's room for toddler courtesy of FSN.  CSW also received a telephone call from "TJ's mother" regarding CSW's clinical assessment with MOB.  TJ's mother expressed frustration regarding questions that were asked about visiting toddler.  CSW informed, "TJ''s mother that CSW was not at liberty to share any information regarding client, however CSW can listen to her due to HIPAA.   TJ's mother became frustrated and hung the phone up on CSW.   Blaine HamperAngel Boyd-Gilyard, MSW, LCSW Clinical Social Work [phone]

## 2018-04-15 NOTE — Progress Notes (Signed)
Patient ID: Kathryn Shelton, female   DOB: 05-14-1991, 27 y.o.   MRN: [id number] FACULTY PRACTICE ANTEPARTUM(COMPREHENSIVE) NOTE  Kathryn Shelton is a 27 y.o. G3P2002 at [redacted]w[redacted]d by best clinical estimate who is admitted for PROM.   Fetal presentation is cephalic. Length of Stay:  13  Days  Subjective: No complaints. Concerned about SW consult and CPS involvement. Patient reports the fetal movement as active. Patient reports uterine contraction  activity as none. Patient reports  vaginal bleeding as none. Patient describes fluid per vagina as clear.  Vitals:  Blood pressure (!) 84/60, pulse 96, temperature 98 F (36.7 C), temperature source Oral, resp. rate 18, height 5\' 6"  (1.676 m), weight 96.6 kg, last menstrual period 10/07/2017, SpO2 100 %. Physical Examination:  General appearance - alert, well appearing, and in no distress Chest - normal effort Abdomen - gravid, non-tender Fundal Height:  size equals dates Extremities: Homans sign is negative, no sign of DVT  Membranes:ruptured, clear fluid  Fetal Monitoring:  Baseline: 150 bpm, Variability: Good {> 6 bpm), Accelerations: Non-reactive but appropriate for gestational age and Decelerations: Variable: mild  Labs:  Results for orders placed or performed during the hospital encounter of 04/02/18 (from the past 24 hour(s))  Glucose, capillary   Collection Time: 04/14/18  2:07 PM  Result Value Ref Range   Glucose-Capillary 141 (H) 70 - 99 mg/dL  Glucose, capillary   Collection Time: 04/14/18  7:02 PM  Result Value Ref Range   Glucose-Capillary 170 (H) 70 - 99 mg/dL  Glucose, capillary   Collection Time: 04/15/18  8:02 AM  Result Value Ref Range   Glucose-Capillary 185 (H) 70 - 99 mg/dL  Glucose, capillary   Collection Time: 04/15/18 11:07 AM  Result Value Ref Range   Glucose-Capillary 46 (L) 70 - 99 mg/dL  Glucose, capillary   Collection Time: 04/15/18 11:41 AM  Result Value Ref Range   Glucose-Capillary 78 70 - 99 mg/dL    Imaging [place] Mfm Ob Follow Up  Result Date: 04/03/2018 ----------------------------------------------------------------------  OBSTETRICS REPORT                       (Signed Final 04/03/2018 09:30 am) ---------------------------------------------------------------------- Patient Info  ID #:       [id number]                          D.O.B.:  08-Dec-1990 (27 yrs)  Name:       Kathryn Shelton                  Visit Date: 04/02/2018 05:30 pm ---------------------------------------------------------------------- Performed By  Performed By:     Marcellina Millin RDM      Ref. Address:     [address]                                                             [address]  Attending:        Noralee Space MD        Location:         [facility]  Referred By:      Conan Bowens                    MD ----------------------------------------------------------------------  Orders   #  Description                          Code         Ordered By   1  US MFM OB FOLLOW UP                  [id number]     KELLY DAVIS  ----------------------------------------------------------------------   #  Order #                    Accession #                 Episode #   1  [id number]                  [id number]                  [id number]  ---------------------------------------------------------------------- Indications   [redacted] weeks gestation of pregnancy                Z3A.22   Hypertension - Chronic/Pre-existing (no        O10.019   meds)   Pre-existing diabetes, type 2, in pregnancy,   O24.112   second trimester (metformin, insulin, not   currently taking either)   Premature rupture of membranes - leaking       O42.90   fluid  ---------------------------------------------------------------------- Fetal Evaluation  Num Of Fetuses:         1  Fetal Heart Rate(bpm):  142  Cardiac Activity:       Observed  Presentation:           Cephalic  Placenta:               Anterior  P. Cord Insertion:      Previously Visualized  Amniotic Fluid  AFI FV:       Oligohydramnios  AFI Sum(cm)     %Tile       Largest Pocket(cm)  3.53            < 3         2.2  RUQ(cm)                     LUQ(cm)  1.33                        2.2 ---------------------------------------------------------------------- Biometry  BPD:      56.3  mm     G. Age:  23w 1d         65  %    CI:        76.26   %    70 - 86                                                          FL/HC:      19.5   %    19.2 - 20.8  HC:      204.3  mm     G. Age:  22w 4d         30  %    HC/AC:      1.09        1.05 - 1.21  AC:      187.3  mm     G. Age:  23w 4d         67  %    FL/BPD:     70.7   %    71 - 87  FL:       39.8  mm     G. Age:  22w 6d         43  %    FL/AC:      21.2   %    20 - 24  Est. FW:     567  gm      1 lb 4 oz     58  % ---------------------------------------------------------------------- OB History  Gravidity:    3         Term:   2        Prem:   0        SAB:   0  TOP:          0       Ectopic:  0        Living: 2 ---------------------------------------------------------------------- Gestational Age  LMP:           25w 2d        Date:  10/07/17                 EDD:   07/14/18  U/S Today:     23w 0d                                        EDD:   07/30/18  Best:          22w 5d     Det. ByMarcella Dubs         EDD:   08/01/18                                      (12/22/17) ---------------------------------------------------------------------- Anatomy  Cranium:               Appears normal         Aortic Arch:            Previously seen  Cavum:                 Previously seen        Ductal Arch:            Previously seen  Ventricles:            Appears normal         Diaphragm:              Appears normal  Choroid Plexus:        Appears normal         Stomach:                Appears normal, left  sided  Cerebellum:            Previously seen        Abdomen:                Appears normal  Posterior Fossa:       Previously  seen        Abdominal Wall:         Not well visualized  Nuchal Fold:           Previously seen        Cord Vessels:           Appears normal (3                                                                        vessel cord)  Face:                  Orbits nl; profile not Kidneys:                Previously seen                         well visualized  Lips:                  Not well visualized    Bladder:                Appears normal  Thoracic:              Appears normal         Spine:                  Not well visualized  Heart:                 Previously seen        Upper Extremities:      Previously seen  RVOT:                  Appears normal         Lower Extremities:      Previously seen  LVOT:                  Appears normal  Other:  Technically difficult due to low amniotic fluid. ---------------------------------------------------------------------- Doppler - Fetal Vessels  Umbilical Artery   S/D     %tile                                            ADFV    RDFV  3.58       46                                                No      No ---------------------------------------------------------------------- Cervix Uterus Adnexa  Cervix  Not adaquately visualized  Adnexa  No abnormality visualized. ---------------------------------------------------------------------- Impression  Patient is admitted with the diagnosis PPROM.  On ultrasound,  amniotic fluid is decreased. Deepest vertical  pocket measures 2 cm.  Fetal biometry is appropriate for gestational age. Fetal  anatomy appears normal, but limited by oligohydramnios. ---------------------------------------------------------------------- Recommendations  -Follow-up scan in 2 weeks. ----------------------------------------------------------------------                  Noralee Space, MD Electronically Signed Final Report   04/03/2018 09:30 am ----------------------------------------------------------------------   Medications:  Scheduled . aspirin EC  81  mg Oral Daily  . docusate sodium  100 mg Oral Daily  . enoxaparin (LOVENOX) injection  50 mg Subcutaneous Q24H  . Influenza vac split quadrivalent PF  0.5 mL Intramuscular Tomorrow-1000  . insulin aspart  0-16 Units Subcutaneous TID PC  . insulin aspart  8 Units Subcutaneous TID WC  . insulin glargine  15 Units Subcutaneous QAC breakfast  . insulin glargine  5 Units Subcutaneous QHS  . prenatal multivitamin  1 tablet Oral Q1200   I have reviewed the patient's current medications.  ASSESSMENT: Principal Problem:   Preterm premature rupture of membranes, unspecified as to length of time between rupture and onset of labor, second trimester Active Problems:   Pre-existing type 2 diabetes mellitus during pregnancy, antepartum   PLAN: Contine current regimen of Lantus 15/5, Novolog to 8/8/8. As per Diabetic Education recommendations, added after meal sliding scale insulin for better meal coverage. Follow up AFI scan ordered for this week. Reassuring FHR tracing, s/p betamethasone, s/p latency antibiotics, s/p magnesium sulfate S/p Neonatology attending consult, appreciate their recommendations Reassuring FHT tracing Delivery with signs/symptoms of infection or other concerning maternal-fetal condition Continue inpatient management and routine antepartum care.    Jaynie Collins, MD 04/15/2018,1:31 PM

## 2018-04-15 NOTE — Plan of Care (Signed)
Patient non-compliant with her diabetes diet and insulin regimen.

## 2018-04-15 NOTE — Clinical SW OB High Risk (Signed)
Clinical Social Work Antenatal   Clinical Social Worker:  Burnis Medin, [place] Date/Time:  04/15/2018, 2:06 PM Gestational Age on Admission:  27 y.o. Admitting Diagnosis:  PPROM    Expected Delivery Date:  08/03/18  Family/Home Environment  Home Address:  [place], [place] [address]  Household Member/Support Name:  Sister [facility]) Relationship:  Sister, Friend, Significant Other, Other(God mother) Other Support:     Psychosocial Data  Information Source:  Patient Interview Resources:  Case Manager (patient not aware of name and agency);    Employment:  Unemployed   Medicaid [facility]):  [place]:  n/a   Current GradeField seismologist  Homebound Arranged: No  Other Resources:  Medicaid, Physicist, medical, [employer]  Cultural/Environment Issues Impacting Care:     Strengths/Weaknesses/Factors to Consider  Concerns Related to Hospitalization:  None reported   Previous Pregnancies/Feelings Towards Pregnancy?  Concerns related to being/becoming a mother?:  Patient is concerned about CPS involvement and reported that "no one is taking her baby". Patient has a history with CPS and has lost custody of 2 older children.   Social Support (FOB? Who is/will be helping with baby/other kids?): Patient has support from (sister, god mother, friend and boyfriend). Patient reported that FOB is not involved nor aware. Patient reported that she is not aware of who FOB is.   Couples Relationship (describe): Patient reported that her boyfriend is supportive.    Recent Stressful Life Events (life changes in past year?):  Unknown   Prenatal Care/Education/Home Preparations: Patient's support reported that patient will have everything that she needs for baby and will be able to care for baby. CSW asked patient if she was interested in any parenting education programs, patient unsure. CSW provided brochure for further information about parenting education  programs.    Domestic Violence (of any type):  No If Yes to Domestic Violence, Describe/Action Plan:     Substance Use During Pregnancy: No (If Yes, Complete SBIRT)  Complete PHQ-9 (Depresssion Screening) on all Antenatal Patients PHQ-9 Score (If Score => 15 complete TREAT):     Follow-up Recommendations:     Patient Advised/Response:      Other:      Clinical Assessment/Plan:   CSW met with patient at bedside to complete assessment and to discuss concerns about 41 year old child in the room with patient. Patient accompanied by her boyfriend (TJ; Faustino Congress) and her boyfriends 51 year old daughter Dolores Lory. CSW asked for permission to speak with patient alone, patient agreed and patients boyfriend and 9 year old daughter stepped outside of the room. Patient initially was warm and welcoming then as the assessment continued patient became frustrated, irritated and apprehensive about responding to CSW questions. CSW inquired about patients other children. Patient reported that her two older children were in the custody of the paternal grandmother and that she lost custody after CPS involvement. CSW informed patient that a notification would have to be made to CPS when patients baby is delivered due to CPS involvement/history with losing custody of other children. Patient became noticeably upset during assessment and reported no one is taking my baby. CSW reassured patient that CSW had no intentions nor ability to take patients baby. CSW explained that CSW was completing an assessment to see if there were any resources that patient was in need of prior to and after delivery and that a notification to CPS would be made after babys delivery. Patient reported that her supports would  provide everything she needs and continued to say no one is taking baby. CSW continued to reassure patient that it was not CSWs intention to upset her and that CSW is not present to take patients baby  or to talk about taking patients baby.   Patient was a poor historian and contacted multiple supports throughout the assessment to speak with CSW to confirm her supports. Patient granted CSW verbal permission to speak with supports (Jasmine Bostic, [place] and Fae Pippin).   CSW first spoke with patients sister Liam Graham [phone]) who reported that she is a support for patient and is not aware why CSW is asking patient all of these questions. CSW explained role and reason for assessment. Patients sister verbalized understanding and reported that patients fear was coming from losing custody of her two oldest children. CSW acknowledged and validated patients fear and reported that the intention of the consult was to assess for needs and provide resources as needed.   CSW asked patients permission to speak with her boyfriend about his 91-year-old daughter and her stay at the hospital, patient granted CSW verbal permission. CSW inquired about 60-year-old childs stay at the hospital. Patients boyfriend reported that he has 50/50 custody of his 8-year-old daughter that was present and that they are only visiting with patient to provide support (patients boyfriend is no the father of the baby that patient is pregnant with). Patients boyfriend reported that he has daughter 7 days at a time and that they do not reside with patient. CSW inquired about any resources that were needed for 11-year-old child while they were visiting in the hospital, patients boyfriend reported none. Patient reported that 58-year-old has been fed while at the hospital, noting her supports bring food to the hospital for all of them. Patient also reported that 52-year-old has been bathed and that her boyfriend has changes of clothes for the child. Patients boyfriend reported no needs for 51-year-old child. CSW interacted with 30-year-old child and did not observe any signs of abuse or neglect. 70-year-old  interacted appropriately with CSW. CSW agreed to bring 63 year old child a coloring book.   CSW spoke with patients god mother Brooke Bonito [phone]). CSW explained role and reason for assessment, patients god mother verbalized understanding. Patients god mother reported that she is a support for patient and that patient will have everything that the baby needs to care/provide for baby. Patients godmother reported that it is fine to contact her in the future if needed.  CSW spoke with patients friend Fae Pippin [phone]). CSW explained role and reason assessment. Patients friend reported that patient was upset because she has been lied on in the past and that her children were taken from her. Patients friend reported that patient is good with children and that she will have everything that baby needs and support to assist with caring for baby after delivery. Patients friend gave CSW permission to contact her in the future if needed.   CSW agreed to follow up with patient weekly to provide support and assess for needs, patient reported that it was fine. CSW will continue to follow and offer support to patient as needed.

## 2018-04-15 NOTE — Progress Notes (Addendum)
Inpatient Diabetes Program Recommendations  AACE/ADA: New Consensus Statement on Inpatient Glycemic Control (2015)  Target Ranges:  Prepandial:   less than 140 mg/dL      Peak postprandial:   less than 180 mg/dL (1-2 hours)      Critically ill patients:  140 - 180 mg/dL   Lab Results  Component Value Date   GLUCAP 185 (H) 04/15/2018   HGBA1C 8.2 (H) 01/14/2018    Review of Glycemic Control Results for Kathryn Shelton, Kathryn Shelton (MRN [id number]) as of 04/15/2018 09:20  Ref. Range 04/14/2018 14:07 04/14/2018 19:02 04/15/2018 08:02  Glucose-Capillary Latest Ref Range: 70 - 99 mg/dL 119141 (H) 147170 (H) 829185 (H)   Diabetes history:Type 2 DM Outpatient Diabetes medications:Lantus 10 units QHS Current orders for Inpatient glycemic control:Lantus 5 units QHS, Lantus 15 units QAM, Novolog 8 units TID  Inpatient Diabetes Program Recommendations:   Blood glucose trends are increased, since changing insulin from NPH to Lantus. Given the differences in peak times would consider changing back to NPH.   Consider NPH 15 units QAM, NPH 5 units QHS.   Additionally, patient has struggled with compliance with diet and so orders were changed to regular. Patient is not informing nurse of when she is eating; making insulin timing for meal coverage difficult to follow as the way the order is written. Patient did not receive lunch time dose on 11/20 due to lack of informing RN of when she ate and per RN patient did not consume >50% of meal. Also, patient did not receive meal coverage for the 4th meal she ate at 2000, thus further explaining increased FSBS this AM. Consider adding back Novolog 0-14 units TID following meals under diabetic pregnant patient order set.    Addendum@1300 : Noted patient experienced hypoglycemic event of 46 mg/dL following Novolog 8 units meal coverage. Question amount of carbs patient had for intake. Consider decreasing meal coverage to Novolog 5 units TID (assuming that patient is consuming >50%  of meal and CBG >80 mg/dL).   Thanks, Lujean RaveLauren Deyja Sochacki, MSN, RNC-OB Diabetes Coordinator [phone] (8a-5p)

## 2018-04-15 NOTE — Progress Notes (Signed)
After being out of her room for a couple hours, pt returning to her room and stating that she had her lunch at 1200, which she had not called for her insulin, 2 1/2 PP CBG done and PP coverage given. Dr. Macon LargeAnyanwu made aware of missed insulin coverage.

## 2018-04-15 NOTE — Progress Notes (Signed)
RN to room when yelling heard at the nurses station. Pt screaming on her cell phone that "she is not going to go through this again and let someone take her baby away from her"  Pt visibly upset, pt verbalizing the conversation with the CSW was the precipitating factor. Patient then up and packing her belongings and repeating that she was leaving the hospital. Dr. Macon LargeAnyanwu called to room, MD encouraging her to stay at this hospital to continue care of her high risk pregnancy. Risks to the patient and the fetus reviewed by MD. Pt finally agreed to stay a patient in the hospital. Pt stating that she was going to walk outside for some fresh air.

## 2018-04-16 ENCOUNTER — Inpatient Hospital Stay (HOSPITAL_BASED_OUTPATIENT_CLINIC_OR_DEPARTMENT_OTHER): Payer: Medicaid Other

## 2018-04-16 DIAGNOSIS — O4290 Premature rupture of membranes, unspecified as to length of time between rupture and onset of labor, unspecified weeks of gestation: Secondary | ICD-10-CM

## 2018-04-16 DIAGNOSIS — Z3A24 24 weeks gestation of pregnancy: Secondary | ICD-10-CM

## 2018-04-16 DIAGNOSIS — O24112 Pre-existing diabetes mellitus, type 2, in pregnancy, second trimester: Secondary | ICD-10-CM

## 2018-04-16 DIAGNOSIS — O10012 Pre-existing essential hypertension complicating pregnancy, second trimester: Secondary | ICD-10-CM

## 2018-04-16 LAB — GLUCOSE, CAPILLARY
GLUCOSE-CAPILLARY: 110 mg/dL — AB (ref 70–99)
Glucose-Capillary: 73 mg/dL (ref 70–99)
Glucose-Capillary: 81 mg/dL (ref 70–99)
Glucose-Capillary: 170 mg/dL — ABNORMAL HIGH (ref 70–99)

## 2018-04-16 MED ORDER — INSULIN ASPART 100 UNIT/ML ~~LOC~~ SOLN
5.0000 [IU] | Freq: Three times a day (TID) | SUBCUTANEOUS | Status: DC
  Administered 2018-04-17 – 2018-04-19 (×7): 5 [IU] via SUBCUTANEOUS

## 2018-04-16 NOTE — Progress Notes (Signed)
Changes made to insulin regimen based on Diabetic Educator's recommendations. Appreciate their input.  Kathryn CollinsUGONNA  Khadir Roam, MD, FACOG Obstetrician & Gynecologist, [facility], [facility]

## 2018-04-16 NOTE — Progress Notes (Signed)
I spent time with Kathryn CornersOctavia today and she shared with me her feelings from her experience the previous day. She was very triggered by the mention of CPS and wanted to leave AMA.  Her caseworker came and talked her back into staying and her godmother (or baby's godmother) helped her to calm herself.  She also then showed good self-care by listening to meaningful music and praying.  I pointed out her support as well as good self-care.  She states that she is again at peace.  We will continue to offer support to her as we are able.  Chaplain Dyanne CarrelKAty Jerimey Burridge, bcc Pager, [phone] 3:28 PM

## 2018-04-16 NOTE — Progress Notes (Signed)
Inpatient Diabetes Program Recommendations  AACE/ADA: New Consensus Statement on Inpatient Glycemic Control (2015)  Target Ranges:  Prepandial:   less than 140 mg/dL      Peak postprandial:   less than 180 mg/dL (1-2 hours)      Critically ill patients:  140 - 180 mg/dL   Lab Results  Component Value Date   GLUCAP 73 04/16/2018   HGBA1C 8.2 (H) 01/14/2018    Review of Glycemic Control Results for Kathryn Shelton, Kathryn Shelton (MRN [id number]) as of 04/16/2018 11:09  Ref. Range 04/15/2018 11:07 04/15/2018 11:41 04/15/2018 14:32 04/15/2018 18:11 04/16/2018 08:22  Glucose-Capillary Latest Ref Range: 70 - 99 mg/dL 46 (L) 78 956182 (H) 213131 (H) 73   Diabetes history:Type 2 DM Outpatient Diabetes medications:Lantus 10 units QHS Current orders for Inpatient glycemic control:Lantus 5 units QHS, Lantus 15 units QAM, Novolog 8units TID, Novolog 0-14 units TID PC  Inpatient Diabetes Program Recommendations:   From note of 11/21- "Noted patient experienced hypoglycemic event of 46 mg/dL following Novolog 8 units meal coverage. Question amount of carbs patient had for intake. " Consider decreasing meal coverage to Novolog 5 units TID (assuming that patient is consuming >50% of meal and CBG >80 mg/dL).  Thanks, Lujean RaveLauren Fable Huisman, MSN, RNC-OB Diabetes Coordinator [phone] (8a-5p)

## 2018-04-16 NOTE — Progress Notes (Addendum)
Patient refused fetal  monitoring at this time stated "I'm too hot for that". Temp 98.6 orally. Stated that she will let me know when if at all.

## 2018-04-16 NOTE — Progress Notes (Signed)
Patient ID: Kathryn Shelton, female   DOB: [birth year], 27 y.o.   MRN: [id number] FACULTY PRACTICE ANTEPARTUM(COMPREHENSIVE) NOTE  Kathryn Shelton is a 27 y.o. G3P2002 at [redacted]w[redacted]d by best clinical estimate who is admitted for PROM.   Fetal presentation is cephalic. Length of Stay:  14  Days  Subjective: No complaints.  Patient reports the fetal movement as active. Patient reports uterine contraction  activity as none. Patient reports  vaginal bleeding as none. Patient describes fluid per vagina as clear.  Vitals:  Blood pressure (!) 92/58, pulse 93, temperature 98 F (36.7 C), temperature source Oral, resp. rate 18, height 5\' 6"  (1.676 m), weight 96.6 kg, last menstrual period 10/07/2017, SpO2 99 %. Physical Examination:  General appearance - alert, well appearing, and in no distress Chest - normal effort Abdomen - gravid, non-tender Fundal Height:  size equals dates Extremities: Homans sign is negative, no sign of DVT  Membranes:ruptured, clear fluid  Fetal Monitoring:  Baseline: 150 bpm, Variability: Good {> 6 bpm), Accelerations: Non-reactive but appropriate for gestational age and Decelerations: None  Labs:  Results for orders placed or performed during the hospital encounter of 04/02/18 (from the past 24 hour(s))  Glucose, capillary   Collection Time: 04/15/18 11:07 AM  Result Value Ref Range   Glucose-Capillary 46 (L) 70 - 99 mg/dL  Glucose, capillary   Collection Time: 04/15/18 11:41 AM  Result Value Ref Range   Glucose-Capillary 78 70 - 99 mg/dL  Glucose, capillary   Collection Time: 04/15/18  2:32 PM  Result Value Ref Range   Glucose-Capillary 182 (H) 70 - 99 mg/dL  Glucose, capillary   Collection Time: 04/15/18  6:11 PM  Result Value Ref Range   Glucose-Capillary 131 (H) 70 - 99 mg/dL  Glucose, capillary   Collection Time: 04/16/18  8:22 AM  Result Value Ref Range   Glucose-Capillary 73 70 - 99 mg/dL   Imaging [place] Mfm Ob Follow Up  Result Date:  04/03/2018 ----------------------------------------------------------------------  OBSTETRICS REPORT                       (Signed Final 04/03/2018 09:30 am) ---------------------------------------------------------------------- Patient Info  ID #:       [id number]                          D.O.B.:  20-Jul-1990 (27 yrs)  Name:       Kathryn Shelton                  Visit Date: 04/02/2018 05:30 pm ---------------------------------------------------------------------- Performed By  Performed By:     Marcellina Millin RDM      Ref. Address:     [address]                                                             [address]  Attending:        Noralee Space MD        Location:         [facility]  Referred By:      Conan Bowens                    MD ---------------------------------------------------------------------- Orders   #  Description  Code         Ordered By   1  [place] MFM OB FOLLOW UP                  [id number]     KELLY DAVIS  ----------------------------------------------------------------------   #  Order #                    Accession #                 Episode #   1  [id number]                  [id number]                  [id number]  ---------------------------------------------------------------------- Indications   [redacted] weeks gestation of pregnancy                Z3A.22   Hypertension - Chronic/Pre-existing (no        O10.019   meds)   Pre-existing diabetes, type 2, in pregnancy,   O24.112   second trimester (metformin, insulin, not   currently taking either)   Premature rupture of membranes - leaking       O42.90   fluid  ---------------------------------------------------------------------- Fetal Evaluation  Num Of Fetuses:         1  Fetal Heart Rate(bpm):  142  Cardiac Activity:       Observed  Presentation:           Cephalic  Placenta:               Anterior  P. Cord Insertion:      Previously Visualized  Amniotic Fluid  AFI FV:      Oligohydramnios  AFI Sum(cm)     %Tile       Largest  Pocket(cm)  3.53            < 3         2.2  RUQ(cm)                     LUQ(cm)  1.33                        2.2 ---------------------------------------------------------------------- Biometry  BPD:      56.3  mm     G. Age:  23w 1d         65  %    CI:        76.26   %    70 - 86                                                          FL/HC:      19.5   %    19.2 - 20.8  HC:      204.3  mm     G. Age:  22w 4d         30  %    HC/AC:      1.09        1.05 - 1.21  AC:      187.3  mm     G. Age:  23w 4d         67  %  FL/BPD:     70.7   %    71 - 87  FL:       39.8  mm     G. Age:  22w 6d         43  %    FL/AC:      21.2   %    20 - 24  Est. FW:     567  gm      1 lb 4 oz     58  % ---------------------------------------------------------------------- OB History  Gravidity:    3         Term:   2        Prem:   0        SAB:   0  TOP:          0       Ectopic:  0        Living: 2 ---------------------------------------------------------------------- Gestational Age  LMP:           25w 2d        Date:  10/07/17                 EDD:   07/14/18  U/S Today:     23w 0d                                        EDD:   07/30/18  Best:          22w 5d     Det. ByMarcella Dubs         EDD:   08/01/18                                      (12/22/17) ---------------------------------------------------------------------- Anatomy  Cranium:               Appears normal         Aortic Arch:            Previously seen  Cavum:                 Previously seen        Ductal Arch:            Previously seen  Ventricles:            Appears normal         Diaphragm:              Appears normal  Choroid Plexus:        Appears normal         Stomach:                Appears normal, left                                                                        sided  Cerebellum:            Previously seen        Abdomen:  Appears normal  Posterior Fossa:       Previously seen        Abdominal Wall:         Not well visualized   Nuchal Fold:           Previously seen        Cord Vessels:           Appears normal (3                                                                        vessel cord)  Face:                  Orbits nl; profile not Kidneys:                Previously seen                         well visualized  Lips:                  Not well visualized    Bladder:                Appears normal  Thoracic:              Appears normal         Spine:                  Not well visualized  Heart:                 Previously seen        Upper Extremities:      Previously seen  RVOT:                  Appears normal         Lower Extremities:      Previously seen  LVOT:                  Appears normal  Other:  Technically difficult due to low amniotic fluid. ---------------------------------------------------------------------- Doppler - Fetal Vessels  Umbilical Artery   S/D     %tile                                            ADFV    RDFV  3.58       46                                                No      No ---------------------------------------------------------------------- Cervix Uterus Adnexa  Cervix  Not adaquately visualized  Adnexa  No abnormality visualized. ---------------------------------------------------------------------- Impression  Patient is admitted with the diagnosis PPROM.  On ultrasound, amniotic fluid is decreased. Deepest vertical  pocket measures 2 cm.  Fetal biometry is appropriate for gestational age. Fetal  anatomy appears normal, but limited by oligohydramnios. ---------------------------------------------------------------------- Recommendations  -Follow-up scan in 2 weeks. ----------------------------------------------------------------------  Noralee Spaceavi Shankar, MD Electronically Signed Final Report   04/03/2018 09:30 am ----------------------------------------------------------------------   Medications:  Scheduled . aspirin EC  81 mg Oral Daily  . docusate sodium  100 mg Oral Daily   . enoxaparin (LOVENOX) injection  50 mg Subcutaneous Q24H  . Influenza vac split quadrivalent PF  0.5 mL Intramuscular Tomorrow-1000  . insulin aspart  0-16 Units Subcutaneous TID PC  . insulin aspart  8 Units Subcutaneous TID WC  . insulin glargine  15 Units Subcutaneous QAC breakfast  . insulin glargine  5 Units Subcutaneous QHS  . prenatal multivitamin  1 tablet Oral Q1200   I have reviewed the patient's current medications.  ASSESSMENT: Principal Problem:   Preterm premature rupture of membranes, unspecified as to length of time between rupture and onset of labor, second trimester Active Problems:   Pre-existing type 2 diabetes mellitus during pregnancy, antepartum   PLAN: Contine current regimen of Lantus 15/5, Novolog to 8/8/8. As per Diabetic Education recommendations on 04/15/2018, added after meal sliding scale insulin for better meal coverage. This has helped a lot with the glycemic control for this patient; appreciate their input. Follow up AFI scan results done earlier this morning Reassuring FHR tracing, s/p betamethasone, s/p latency antibiotics, s/p magnesium sulfate S/p Neonatology attending consult, appreciate their recommendations Reassuring FHR tracing Delivery with signs/symptoms of infection or other concerning maternal-fetal condition Continue inpatient management and routine antepartum care.    Jaynie CollinsUgonna Karlissa Aron, MD 04/16/2018,9:35 AM

## 2018-04-17 ENCOUNTER — Encounter (HOSPITAL_COMMUNITY): Payer: Self-pay | Admitting: Obstetrics and Gynecology

## 2018-04-17 LAB — CREATININE, SERUM: Creatinine, Ser: 0.68 mg/dL (ref 0.44–1.00)

## 2018-04-17 LAB — CBC WITH DIFFERENTIAL/PLATELET
BASOS PCT: 0 %
Basophils Absolute: 0 10*3/uL (ref 0.0–0.1)
Eosinophils Absolute: 0 10*3/uL (ref 0.0–0.5)
Eosinophils Relative: 1 %
HEMATOCRIT: 32.9 % — AB (ref 36.0–46.0)
HEMOGLOBIN: 10.8 g/dL — AB (ref 12.0–15.0)
Lymphocytes Relative: 30 %
Lymphs Abs: 1.5 10*3/uL (ref 0.7–4.0)
MCH: 27.8 pg (ref 26.0–34.0)
MCHC: 32.8 g/dL (ref 30.0–36.0)
MCV: 84.8 fL (ref 80.0–100.0)
Monocytes Absolute: 0.3 10*3/uL (ref 0.1–1.0)
Monocytes Relative: 5 %
NEUTROS ABS: 3.4 10*3/uL (ref 1.7–7.7)
Neutrophils Relative %: 64 %
Platelets: 298 10*3/uL (ref 150–400)
RBC: 3.88 MIL/uL (ref 3.87–5.11)
RDW: 13.8 % (ref 11.5–15.5)
WBC: 5.2 10*3/uL (ref 4.0–10.5)
nRBC: 0 % (ref 0.0–0.2)

## 2018-04-17 LAB — GLUCOSE, CAPILLARY
GLUCOSE-CAPILLARY: 176 mg/dL — AB (ref 70–99)
GLUCOSE-CAPILLARY: 111 mg/dL — AB (ref 70–99)
Glucose-Capillary: 69 mg/dL — ABNORMAL LOW (ref 70–99)
Glucose-Capillary: 111 mg/dL — ABNORMAL HIGH (ref 70–99)

## 2018-04-17 LAB — TYPE AND SCREEN
ABO/RH(D): A POS
Antibody Screen: NEGATIVE

## 2018-04-17 NOTE — Progress Notes (Signed)
Patient took herself off the monitor; short for a 30 min monitoring.  Explained the importance of fetal monitoring but patient insist, " am done for that."

## 2018-04-17 NOTE — Progress Notes (Signed)
Patient continually moving in bed..ultrasound and toco readjusted multiple times.  Patient stated " just take it off and  Ill let you know when you can put me back on."  Monitors removed.

## 2018-04-17 NOTE — Progress Notes (Signed)
Patient ID: Kathryn Shelton, female   DOB: 08-21-1990, 27 y.o.   MRN: [id number]   FACULTY PRACTICE ANTEPARTUM(COMPREHENSIVE) NOTE  Kathryn Shelton is a 27 y.o. [id number] with Estimated Date of Delivery: 08/01/18    4340w6d  who is admitted for PROM.    Fetal presentation is cephalic. Length of Stay:  15  Days  Date of admission:04/02/2018  Subjective: No complaints Patient reports the fetal movement as active. Patient reports uterine contraction  activity as none. Patient reports  vaginal bleeding as none. Patient describes fluid per vagina as Clear.  Vitals:  Blood pressure 123/82, pulse 78, temperature 98.8 F (37.1 C), temperature source Oral, resp. rate 19, height 5\' 6"  (1.676 m), weight 96.6 kg, last menstrual period 10/07/2017, SpO2 100 %. Vitals:   04/16/18 1521 04/16/18 1920 04/16/18 2200 04/17/18 0821  BP: 95/64 108/75  123/82  Pulse: 94 87  78  Resp: 18 18  19   Temp: 97.9 F (36.6 C) 98.9 F (37.2 C) 98.6 F (37 C) 98.8 F (37.1 C)  TempSrc: Oral Oral Oral Oral  SpO2: 100% 97%  100%  Weight:      Height:       Physical Examination:  General appearance - alert, well appearing, and in no distress Abdomen - soft, nontender, nondistended, no masses or organomegaly Fundal Height:  size equals dates Pelvic Exam:    Extremities: extremities normal, atraumatic, no cyanosis or edema with DTRs 2+ bilaterally Membranes: ruptured  Fetal Monitoring:  reassuring     Labs:  Results for orders placed or performed during the hospital encounter of 04/02/18 (from the past 24 hour(s))  Glucose, capillary   Collection Time: 04/16/18 11:49 AM  Result Value Ref Range   Glucose-Capillary 81 70 - 99 mg/dL  Glucose, capillary   Collection Time: 04/16/18  2:06 PM  Result Value Ref Range   Glucose-Capillary 110 (H) 70 - 99 mg/dL  Glucose, capillary   Collection Time: 04/16/18  6:38 PM  Result Value Ref Range   Glucose-Capillary 170 (H) 70 - 99 mg/dL  CBC with Differential/Platelet   Collection Time: 04/17/18  7:42 AM  Result Value Ref Range   WBC 5.2 4.0 - 10.5 K/uL   RBC 3.88 3.87 - 5.11 MIL/uL   Hemoglobin 10.8 (L) 12.0 - 15.0 g/dL   HCT 14.732.9 (L) 82.936.0 - 56.246.0 %   MCV 84.8 80.0 - 100.0 fL   MCH 27.8 26.0 - 34.0 pg   MCHC 32.8 30.0 - 36.0 g/dL   RDW 13.013.8 86.511.5 - 78.415.5 %   Platelets 298 150 - 400 K/uL   nRBC 0.0 0.0 - 0.2 %   Neutrophils Relative % 64 %   Neutro Abs 3.4 1.7 - 7.7 K/uL   Lymphocytes Relative 30 %   Lymphs Abs 1.5 0.7 - 4.0 K/uL   Monocytes Relative 5 %   Monocytes Absolute 0.3 0.1 - 1.0 K/uL   Eosinophils Relative 1 %   Eosinophils Absolute 0.0 0.0 - 0.5 K/uL   Basophils Relative 0 %   Basophils Absolute 0.0 0.0 - 0.1 K/uL  Creatinine, serum   Collection Time: 04/17/18  7:42 AM  Result Value Ref Range   Creatinine, Ser 0.68 0.44 - 1.00 mg/dL   GFR calc non Af Amer >60 >60 mL/min   GFR calc Af Amer >60 >60 mL/min  Type and screen   Collection Time: 04/17/18  7:42 AM  Result Value Ref Range   ABO/RH(D) A POS    Antibody Screen NEG  Sample Expiration      04/20/2018 Performed at [facility], [address], [place], [place] [address]   Glucose, capillary   Collection Time: 04/17/18  8:20 AM  Result Value Ref Range   Glucose-Capillary 69 (L) 70 - 99 mg/dL    Imaging Studies:      Medications:  Scheduled . aspirin EC  81 mg Oral Daily  . docusate sodium  100 mg Oral Daily  . enoxaparin (LOVENOX) injection  50 mg Subcutaneous Q24H  . Influenza vac split quadrivalent PF  0.5 mL Intramuscular Tomorrow-1000  . insulin aspart  0-16 Units Subcutaneous TID PC  . insulin aspart  5 Units Subcutaneous TID WC  . insulin glargine  15 Units Subcutaneous QAC breakfast  . insulin glargine  5 Units Subcutaneous QHS  . prenatal multivitamin  1 tablet Oral Q1200   I have reviewed the patient's current medications.  ASSESSMENT: G3P2002 [redacted]w[redacted]d Estimated Date of Delivery: 08/01/18  PPROM Class B DM  PLAN: Contine current regimen of Lantus  15/5, Novolog to 8/8/8. As per Diabetic Education recommendations on 04/15/2018, added after meal sliding scale insulin for better meal coverage. This has helped a lot with the glycemic control for this patient; appreciate their input. Follow up AFI scan results done earlier this morning Reassuring FHR tracing, s/p betamethasone, s/p latency antibiotics, s/p magnesium sulfate S/p Neonatology attending consult, appreciate their recommendations Reassuring FHR tracing Delivery with signs/symptoms of infection or other concerning maternal-fetal condition Continue inpatient management and routine antepartum care.  Amaryllis Dyke Jacobs Golab 04/17/2018,9:04 AM

## 2018-04-17 NOTE — Progress Notes (Signed)
Discussed with patient the need to place her on the fetal monitor.  Patient states " I'm not ready to get on that right now... I'll let you know when I'm ready."

## 2018-04-18 DIAGNOSIS — O24112 Pre-existing diabetes mellitus, type 2, in pregnancy, second trimester: Secondary | ICD-10-CM

## 2018-04-18 LAB — GLUCOSE, CAPILLARY
GLUCOSE-CAPILLARY: 124 mg/dL — AB (ref 70–99)
GLUCOSE-CAPILLARY: 161 mg/dL — AB (ref 70–99)
GLUCOSE-CAPILLARY: 120 mg/dL — AB (ref 70–99)
GLUCOSE-CAPILLARY: 167 mg/dL — AB (ref 70–99)

## 2018-04-18 NOTE — Progress Notes (Signed)
Daily Antepartum Note  Admission Date: 04/02/2018 Current Date: 04/18/2018 7:37 AM  Kathryn Shelton is a 27 y.o. [id number] @ 75104w0d, HD#17, admitted for PPROM (since 17wks).  Pregnancy complicated by: Patient Active Problem List   Diagnosis Date Noted  . Oligohydramnios antepartum, second trimester, other fetus 03/09/2018  . Ruptured, membranes, premature 03/09/2018  . Obesity, morbid (HCC) 03/09/2018  . Preterm premature rupture of membranes, unspecified as to length of time between rupture and onset of labor, second trimester 03/09/2018  . Nausea and vomiting during pregnancy prior to [redacted] weeks gestation 01/14/2018  . Bacterial vaginitis 01/02/2018  . Supervision of high risk pregnancy, antepartum 12/31/2017  . Chronic hypertension during pregnancy, antepartum 12/31/2017  . Pre-existing type 2 diabetes mellitus during pregnancy, antepartum 12/31/2017  . Mixed hyperlipidemia 12/02/2017  . Type II diabetes mellitus, uncontrolled (HCC) 03/01/2017  . Essential hypertension 03/01/2017  . Intermittent palpitations 03/01/2017  . Panic attack 03/01/2017  . Obstructive sleep apnea syndrome 03/01/2017    Overnight/24hr events:  none  Subjective:  No s/s of infection, PTL  Objective:    Current Vital Signs 24h Vital Sign Ranges  T 98.2 F (36.8 C) Temp  Avg: 98.4 F (36.9 C)  Min: 97.7 F (36.5 C)  Max: 99.1 F (37.3 C)  BP 105/70 BP  Min: 92/57  Max: 123/82  HR 87 Pulse  Avg: 86.5  Min: 78  Max: 97  RR 18 Resp  Avg: 17.8  Min: 16  Max: 19  SaO2 99 % Room Air SpO2  Avg: 99.7 %  Min: 99 %  Max: 100 %       24 Hour I/O Current Shift I/O  Time Ins Outs No intake/output data recorded. No intake/output data recorded.   EFM: 150 baseline, +accels, no decel, mod variability Toco: quiet x 7818m  Physical exam: General: Well nourished, well developed female in no acute distress. Abdomen: gravid nttp Cardiovascular: S1, S2 normal, no murmur, rub or gallop, regular rate and  rhythm Respiratory: CTAB Extremities: no clubbing, cyanosis or edema Skin: Warm and dry.   Medications: Current Facility-Administered Medications  Medication Dose Route Frequency Provider Last Rate Last Dose  . 0.9 %  sodium chloride infusion   Intravenous PRN Conan Bowensavis, Kelly M, MD   Stopped at 04/05/18 [phone]  . acetaminophen (TYLENOL) tablet 650 mg  650 mg Oral Q4H PRN Conan Bowensavis, Kelly M, MD   650 mg at 04/11/18 2349  . aspirin EC tablet 81 mg  81 mg Oral Daily Conan Bowensavis, Kelly M, MD   81 mg at 04/17/18 1123  . calcium carbonate (TUMS - dosed in mg elemental calcium) chewable tablet 400 mg of elemental calcium  2 tablet Oral Q4H PRN Conan Bowensavis, Kelly M, MD      . docusate sodium (COLACE) capsule 100 mg  100 mg Oral Daily Conan Bowensavis, Kelly M, MD   100 mg at 04/17/18 1123  . Influenza vac split quadrivalent PF (FLUARIX) injection 0.5 mL  0.5 mL Intramuscular Tomorrow-1000 Conan Bowensavis, Kelly M, MD      . insulin aspart (novoLOG) injection 0-16 Units  0-16 Units Subcutaneous TID PC Anyanwu, Jethro BastosUgonna A, MD   2 Units at 04/17/18 1831  . insulin aspart (novoLOG) injection 5 Units  5 Units Subcutaneous TID WC Anyanwu, Jethro BastosUgonna A, MD   5 Units at 04/17/18 1728  . insulin glargine (LANTUS) injection 15 Units  15 Units Subcutaneous QAC breakfast Anyanwu, Jethro BastosUgonna A, MD   15 Units at 04/17/18 0846  . insulin glargine (LANTUS) injection 5  Units  5 Units Subcutaneous QHS Anyanwu, Jethro Bastos, MD   5 Units at 04/17/18 2229  . prenatal multivitamin tablet 1 tablet  1 tablet Oral Q1200 Conan Bowens, MD   1 tablet at 04/17/18 1123    Labs:  Recent Labs  Lab 04/14/18 0540 04/17/18 0742  WBC 6.1 5.2  HGB 11.1* 10.8*  HCT 34.2* 32.9*  PLT 306 298   Results for NEALIE, MCHATTON (MRN [id number]) as of 04/18/2018 07:39  Ref. Range 04/17/2018 08:20 04/17/2018 10:25 04/17/2018 14:54 04/17/2018 18:10 04/18/2018 07:24  Glucose-Capillary Latest Ref Range: 70 - 99 mg/dL 69 (L) 409 (H) 811 (H) 111 (H) 124 (H)     Radiology: no new  imaging -11/23: ceph, oligo -11/8: ceph, 550m, efw 58%, AC 67%  Assessment & Plan:  Pt doing well *Pregnancy: reactive NST. Routine care. Not sure about BTL *DM2: continue current regimen *Preterm: no current issues. Will hold off on bmz rescue course since she's been so stable.  -s/p NICU consult -s/p BMZ 11/8 and 11/9 -s/p Mg on admission *PPROM: no current issues -s/p latency abx *PPx: SCDs, OOB *FEN/GI: regular diet (can d/w pt more if okay with changing to dm diet).  *Dispo: pending delivery  Cornelia Copa MD Attending Center for [facility])

## 2018-04-18 NOTE — Progress Notes (Signed)
Explained importance of fetal monitoring for this afternoon shift, but patient stated I'll let you know when I'm ready."

## 2018-04-19 ENCOUNTER — Encounter (HOSPITAL_COMMUNITY): Payer: Self-pay | Admitting: Obstetrics and Gynecology

## 2018-04-19 LAB — GLUCOSE, CAPILLARY
GLUCOSE-CAPILLARY: 47 mg/dL — AB (ref 70–99)
Glucose-Capillary: 238 mg/dL — ABNORMAL HIGH (ref 70–99)
Glucose-Capillary: 48 mg/dL — ABNORMAL LOW (ref 70–99)
Glucose-Capillary: 92 mg/dL (ref 70–99)
Glucose-Capillary: 206 mg/dL — ABNORMAL HIGH (ref 70–99)
Glucose-Capillary: 117 mg/dL — ABNORMAL HIGH (ref 70–99)

## 2018-04-19 MED ORDER — INSULIN GLARGINE 100 UNIT/ML ~~LOC~~ SOLN
7.0000 [IU] | Freq: Every day | SUBCUTANEOUS | Status: DC
  Administered 2018-04-19 – 2018-04-20 (×2): 7 [IU] via SUBCUTANEOUS
  Filled 2018-04-19 (×2): qty 0.07

## 2018-04-19 MED ORDER — INSULIN ASPART 100 UNIT/ML ~~LOC~~ SOLN
7.0000 [IU] | Freq: Three times a day (TID) | SUBCUTANEOUS | Status: DC
  Administered 2018-04-20 – 2018-05-02 (×21): 7 [IU] via SUBCUTANEOUS

## 2018-04-19 MED ORDER — INSULIN GLARGINE 100 UNIT/ML ~~LOC~~ SOLN
17.0000 [IU] | Freq: Every day | SUBCUTANEOUS | Status: DC
  Administered 2018-04-20 – 2018-05-09 (×19): 17 [IU] via SUBCUTANEOUS
  Filled 2018-04-19 (×21): qty 0.17

## 2018-04-19 NOTE — Progress Notes (Signed)
CBG 48, Pt just now starting to drink apple juice when she was told to drink it at 1615. Will recheck in 1645

## 2018-04-19 NOTE — Progress Notes (Signed)
Daily Antepartum Note  Admission Date: 04/02/2018 Current Date: 04/19/2018 10:04 AM  Kathryn CornersOctavia Candis ShineVarney is a 27 y.o. [id number] @ 2129w1d, HD#18, admitted for PPROM (since 17wks).  Pregnancy complicated by: Patient Active Problem List   Diagnosis Date Noted  . Oligohydramnios antepartum, second trimester, other fetus 03/09/2018  . Obesity, morbid (HCC) 03/09/2018  . Preterm premature rupture of membranes, unspecified as to length of time between rupture and onset of labor, second trimester 03/09/2018  . Supervision of high risk pregnancy, antepartum 12/31/2017  . Chronic hypertension during pregnancy, antepartum 12/31/2017  . Pre-existing type 2 diabetes mellitus during pregnancy, antepartum 12/31/2017  . Mixed hyperlipidemia 12/02/2017  . Type II diabetes mellitus, uncontrolled (HCC) 03/01/2017  . Essential hypertension 03/01/2017  . Intermittent palpitations 03/01/2017  . Panic attack 03/01/2017  . Obstructive sleep apnea syndrome 03/01/2017    Overnight/24hr events:  none  Subjective:  No s/s of infection, PTL  Objective:    Current Vital Signs 24h Vital Sign Ranges  T 98.4 F (36.9 C) Temp  Avg: 98.4 F (36.9 C)  Min: 98.3 F (36.8 C)  Max: 98.6 F (37 C)  BP 104/61 BP  Min: 86/50  Max: 107/77  HR 98 Pulse  Avg: 91  Min: 84  Max: 98  RR 18 Resp  Avg: 17.6  Min: 16  Max: 19  SaO2 100 % Room Air SpO2  Avg: 100 %  Min: 100 %  Max: 100 %       24 Hour I/O Current Shift I/O  Time Ins Outs No intake/output data recorded. No intake/output data recorded.   EFM: 155 baseline, + accels, no decel, mod variability Toco: quiet x 7970m  Physical exam: General: Well nourished, well developed female in no acute distress. Abdomen: gravid nttp Cardiovascular: S1, S2 normal, no murmur, rub or gallop, regular rate and rhythm Respiratory: CTAB Extremities: no clubbing, cyanosis or edema Skin: Warm and dry.   Medications: Current Facility-Administered Medications  Medication Dose Route  Frequency Provider Last Rate Last Dose  . 0.9 %  sodium chloride infusion   Intravenous PRN Conan Bowensavis, Kelly M, MD   Stopped at 04/05/18 [phone]  . acetaminophen (TYLENOL) tablet 650 mg  650 mg Oral Q4H PRN Conan Bowensavis, Kelly M, MD   650 mg at 04/11/18 2349  . aspirin EC tablet 81 mg  81 mg Oral Daily Conan Bowensavis, Kelly M, MD   81 mg at 04/18/18 1136  . calcium carbonate (TUMS - dosed in mg elemental calcium) chewable tablet 400 mg of elemental calcium  2 tablet Oral Q4H PRN Conan Bowensavis, Kelly M, MD      . docusate sodium (COLACE) capsule 100 mg  100 mg Oral Daily Conan Bowensavis, Kelly M, MD   100 mg at 04/17/18 1123  . Influenza vac split quadrivalent PF (FLUARIX) injection 0.5 mL  0.5 mL Intramuscular Tomorrow-1000 Conan Bowensavis, Kelly M, MD      . insulin aspart (novoLOG) injection 0-16 Units  0-16 Units Subcutaneous TID PC Anyanwu, Jethro BastosUgonna A, MD   4 Units at 04/18/18 1954  . insulin aspart (novoLOG) injection 5 Units  5 Units Subcutaneous TID WC Anyanwu, Ugonna A, MD   5 Units at 04/19/18 0830  . insulin glargine (LANTUS) injection 15 Units  15 Units Subcutaneous QAC breakfast Anyanwu, Jethro BastosUgonna A, MD   15 Units at 04/19/18 0826  . insulin glargine (LANTUS) injection 5 Units  5 Units Subcutaneous QHS Anyanwu, Ugonna A, MD   5 Units at 04/18/18 2211  . prenatal multivitamin tablet 1  tablet  1 tablet Oral Q1200 Conan Bowens, MD   1 tablet at 04/17/18 1123    Labs:  Recent Labs  Lab 04/14/18 0540 04/17/18 0742  WBC 6.1 5.2  HGB 11.1* 10.8*  HCT 34.2* 32.9*  PLT 306 298   Results for KELSA, JAWOROWSKI (MRN [id number]) as of 04/19/2018 11:37  Ref. Range 04/18/2018 07:24 04/18/2018 09:41 04/18/2018 13:42 04/18/2018 19:38 04/19/2018 10:26  Glucose-Capillary Latest Ref Range: 70 - 99 mg/dL 409 (H) 811 (H) 914 (H) 167 (H) 238 (H)   Radiology: no new imaging -11/23: ceph, oligo -11/8: ceph, 580m, efw 58%, AC 67%  Assessment & Plan:  Pt doing well *Pregnancy: reactive NST. Routine care. -not sure about BTL *DM2: pt changed to DM diet  and insulin adjusted to 17/7 and aspart 7 tid. Continue with am fasting and 2h post prandial checks. Pt got 6 units SSI after her most recent BS. DM team following. Appreciate recommendations *Preterm: no current issues. Will hold off on bmz rescue course since she's been so stable.  -s/p NICU consult -s/p BMZ 11/8 and 11/9 -s/p Mg on admission *PPROM: no current issues -s/p latency abx *PPx: SCDs, OOB *FEN/GI: DM diet *Dispo: pending delivery  Cornelia Copa MD Attending Center for [facility])

## 2018-04-19 NOTE — Progress Notes (Signed)
Pt refusing to be monitored at this time

## 2018-04-19 NOTE — Progress Notes (Signed)
Pt refusing to be monitored at this time 

## 2018-04-19 NOTE — Progress Notes (Addendum)
Patient stated she did not want to eat dinner this evening. Will bring patient a snack. No insulin given at the time

## 2018-04-19 NOTE — Progress Notes (Signed)
CBG 47 at 1615, apple juice given will recheck at 1630

## 2018-04-19 NOTE — Progress Notes (Signed)
Repeat CBG 92 

## 2018-04-19 NOTE — Progress Notes (Addendum)
Inpatient Diabetes Program Recommendations  AACE/ADA: New Consensus Statement on Inpatient Glycemic Control (2015)  Target Ranges:  Prepandial:   less than 140 mg/dL      Peak postprandial:   less than 180 mg/dL (1-2 hours)      Critically ill patients:  140 - 180 mg/dL   Lab Results  Component Value Date   GLUCAP 167 (H) 04/18/2018   HGBA1C 8.2 (H) 01/14/2018    Review of Glycemic Control Results for [name], Jamika (MRN [id number]) as of 04/19/2018 09:55  Ref. Range 04/18/2018 09:41 04/18/2018 13:42 04/18/2018 19:38  Glucose-Capillary Latest Ref Range: 70 - 99 mg/dL 829161 (H) 562120 (H) 130167 (H)   Diabetes history:Type 2 DM Outpatient Diabetes medications:Lantus 10 units QHS Current orders for Inpatient glycemic control:Lantus 5 units QHS, Lantus 15 units QAM, Novolog 5units TID  Inpatient Diabetes Program Recommendations:    Noted increased post prandials, consider increasing meal coverage to Novolog 7 units TID (assuming that patient is consuming >50% of meal and CBG >80 mg/dL).  Addendum @1515 : Noted changes made to Lantus 17/7 and changes to meal coverage. In agreement, will continue to follow.  Thanks, Lujean RaveLauren Bruna Dills, MSN, RNC-OB Diabetes Coordinator [phone] (8a-5p)

## 2018-04-19 NOTE — Progress Notes (Signed)
Pt did not notify staff before she ate breakfast to get fasting blood sugar

## 2018-04-20 LAB — GLUCOSE, CAPILLARY
GLUCOSE-CAPILLARY: 117 mg/dL — AB (ref 70–99)
GLUCOSE-CAPILLARY: 85 mg/dL (ref 70–99)
GLUCOSE-CAPILLARY: 176 mg/dL — AB (ref 70–99)
Glucose-Capillary: 69 mg/dL — ABNORMAL LOW (ref 70–99)
Glucose-Capillary: 80 mg/dL (ref 70–99)
Glucose-Capillary: 100 mg/dL — ABNORMAL HIGH (ref 70–99)
Glucose-Capillary: 144 mg/dL — ABNORMAL HIGH (ref 70–99)

## 2018-04-20 LAB — TYPE AND SCREEN
ABO/RH(D): A POS
Antibody Screen: NEGATIVE

## 2018-04-20 NOTE — Progress Notes (Signed)
Inpatient Diabetes Program Recommendations  AACE/ADA: New Consensus Statement on Inpatient Glycemic Control (2015)  Target Ranges:  Prepandial:   less than 140 mg/dL      Peak postprandial:   less than 180 mg/dL (1-2 hours)      Critically ill patients:  140 - 180 mg/dL   Lab Results  Component Value Date   GLUCAP 85 04/20/2018   HGBA1C 8.2 (H) 01/14/2018    Review of Glycemic Control Results for Kathryn Shelton, Kathryn Shelton (MRN [id number]) as of 04/20/2018 09:12  Ref. Range 04/20/2018 02:41 04/20/2018 08:26 04/20/2018 08:53  Glucose-Capillary Latest Ref Range: 70 - 99 mg/dL 191117 (H) 69 (L) 85   Diabetes history:Type 2 DM Outpatient Diabetes medications:Lantus 10 units QHS Current orders for Inpatient glycemic control:Lantus 7 units QHS, Lantus 17 units QAM, Novolog7units TID, Novolog 0-16 units TID  Inpatient Diabetes Program Recommendations:   Noted hypoglycemic event of 47 mg/dL following meal coverage and correction. Question in looking at documentation if the 238 mg/dL was truly a 2 hour post prandial, thus further question if the correction dosage per sliding was incorrect? At this time, will continue to watch with current orders.   Thanks, Lujean RaveLauren Jerran Tappan, MSN, RNC-OB Diabetes Coordinator [phone] (8a-5p)

## 2018-04-20 NOTE — Progress Notes (Signed)
Pt's BS 69. Pt instructed to drink gingerale. Will recheck in 15 min. Carmelina DaneERRI L Keana Dueitt, RN

## 2018-04-20 NOTE — Progress Notes (Signed)
Pt refusing fetal monitoring this AM. Pt educated on the importance of being placed on EFM. Will continue to educate. Carmelina DaneERRI L Addaleigh Nicholls, RN

## 2018-04-21 LAB — GLUCOSE, CAPILLARY
GLUCOSE-CAPILLARY: 68 mg/dL — AB (ref 70–99)
GLUCOSE-CAPILLARY: 150 mg/dL — AB (ref 70–99)
Glucose-Capillary: 126 mg/dL — ABNORMAL HIGH (ref 70–99)
Glucose-Capillary: 114 mg/dL — ABNORMAL HIGH (ref 70–99)

## 2018-04-21 MED ORDER — INSULIN GLARGINE 100 UNIT/ML ~~LOC~~ SOLN
5.0000 [IU] | Freq: Every day | SUBCUTANEOUS | Status: DC
  Administered 2018-04-21 – 2018-04-28 (×8): 5 [IU] via SUBCUTANEOUS
  Filled 2018-04-21 (×8): qty 0.05

## 2018-04-21 NOTE — Progress Notes (Signed)
Spoke with Diabetes Coordinator, Jeanine with concerns and needed guidance on insulin administration. Fasting BS this morning 68. Patient has eaten around 90% of her breakfast tray. Instructed to given meal coverage. Will continue to monitor. Carmelina DaneERRI L Reida Hem, RN

## 2018-04-21 NOTE — Progress Notes (Signed)
Daily Antepartum Note  Admission Date: 04/02/2018 Current Date: 04/21/2018 9:35 AM  Kathryn Shelton is a 27 y.o. [id number] @ [redacted]w[redacted]d, HD#20, admitted for PPROM (since 17wks).  Pregnancy complicated by: Patient Active Problem List   Diagnosis Date Noted  . Oligohydramnios antepartum, second trimester, other fetus 03/09/2018  . Obesity, morbid (HCC) 03/09/2018  . Preterm premature rupture of membranes, unspecified as to length of time between rupture and onset of labor, second trimester 03/09/2018  . Supervision of high risk pregnancy, antepartum 12/31/2017  . Chronic hypertension during pregnancy, antepartum 12/31/2017  . Pre-existing type 2 diabetes mellitus during pregnancy, antepartum 12/31/2017  . Mixed hyperlipidemia 12/02/2017  . Type II diabetes mellitus, uncontrolled (HCC) 03/01/2017  . Essential hypertension 03/01/2017  . Intermittent palpitations 03/01/2017  . Panic attack 03/01/2017  . Obstructive sleep apnea syndrome 03/01/2017    Overnight/24hr events:  none  Subjective:  No s/s of infection, PTL  Objective:    Current Vital Signs 24h Vital Sign Ranges  T 98.4 F (36.9 C) Temp  Avg: 98.6 F (37 C)  Min: 98.3 F (36.8 C)  Max: 99.1 F (37.3 C)  BP 115/76 BP  Min: 104/63  Max: 123/97  HR 100 Pulse  Avg: 93.8  Min: 86  Max: 100  RR 18 Resp  Avg: 17.3  Min: 16  Max: 18  SaO2 99 % Room Air SpO2  Avg: 98.8 %  Min: 98 %  Max: 100 %       24 Hour I/O Current Shift I/O  Time Ins Outs No intake/output data recorded. No intake/output data recorded.   EFM: 150 baseline, + accels, no decel, mod variability Toco: quiet x 68m  Physical exam: General: Well nourished, well developed female in no acute distress. Abdomen: gravid nttp Cardiovascular: S1, S2 normal, no murmur, rub or gallop, regular rate and rhythm Respiratory: CTAB Extremities: no clubbing, cyanosis or edema Skin: Warm and dry.   Medications: Current Facility-Administered Medications  Medication Dose  Route Frequency Provider Last Rate Last Dose  . 0.9 %  sodium chloride infusion   Intravenous PRN Kathryn Bowens, MD   Stopped at 04/05/18 [phone]  . acetaminophen (TYLENOL) tablet 650 mg  650 mg Oral Q4H PRN Kathryn Bowens, MD   650 mg at 04/11/18 2349  . aspirin EC tablet 81 mg  81 mg Oral Daily Kathryn Bowens, MD   81 mg at 04/20/18 1037  . calcium carbonate (TUMS - dosed in mg elemental calcium) chewable tablet 400 mg of elemental calcium  2 tablet Oral Q4H PRN Kathryn Bowens, MD      . docusate sodium (COLACE) capsule 100 mg  100 mg Oral Daily Kathryn Bowens, MD   100 mg at 04/20/18 1037  . Influenza vac split quadrivalent PF (FLUARIX) injection 0.5 mL  0.5 mL Intramuscular Tomorrow-1000 Kathryn Bowens, MD      . insulin aspart (novoLOG) injection 0-16 Units  0-16 Units Subcutaneous TID PC Anyanwu, Jethro Bastos, MD   4 Units at 04/20/18 2222  . insulin aspart (novoLOG) injection 7 Units  7 Units Subcutaneous TID WC Kathryn [place] Bing, MD   7 Units at 04/21/18 0816  . insulin glargine (LANTUS) injection 17 Units  17 Units Subcutaneous QAC breakfast Kathryn Bing, MD   17 Units at 04/21/18 0750  . insulin glargine (LANTUS) injection 7 Units  7 Units Subcutaneous QHS Kathryn Bing, MD   7 Units at 04/20/18 2221  . prenatal multivitamin tablet 1 tablet  1  tablet Oral Q1200 Kathryn Bowensavis, Kathryn M, MD   1 tablet at 04/20/18 1259    Labs:  Recent Labs  Lab 04/17/18 0742  WBC 5.2  HGB 10.8*  HCT 32.9*  PLT 298   Results for [name], [name] (MRN [id number]) as of 04/21/2018 09:32  Ref. Range 04/20/2018 08:26 04/20/2018 08:53 04/20/2018 12:14 04/20/2018 15:10 04/20/2018 21:17 04/20/2018 22:12 04/21/2018 05:30  Glucose-Capillary Latest Ref Range: 70 - 99 mg/dL 69 (L) 85 80 409100 (H) 811144 (H) 176 (H) 68 (L)   Radiology: no new imaging -11/23: ceph, oligo -11/8: ceph, 52959m, efw 58%, AC 67%  Assessment & Plan:  Pt doing well *Pregnancy: reactive NST. Routine care. -not sure about BTL *DM2: will adjust qhs  to 5. Continue with am fasting and 2h post prandial checks. DM team following. Appreciate recommendations *Preterm: no current issues. Will hold off on bmz rescue course since she's been so stable.  -s/p NICU consult -s/p BMZ 11/8 and 11/9 -s/p Mg on admission *PPROM: no current issues -s/p latency abx *PPx: SCDs, OOB *FEN/GI: pt prefers regular diet. D/w her re: doing dm but still wants regular.  *Dispo: pending delivery  Cornelia Copaharlie Aaliyah Gavel, Jr. MD Attending Center for [facility](Faculty Practice)

## 2018-04-21 NOTE — Progress Notes (Signed)
Pt  Refused to be monitored. Rn educated Pt the need for fetal monitoring. Rn was  Able to monitor baby for 7 mins. Pt took monitor off and refused to be monitored, because she wants to sleep.

## 2018-04-21 NOTE — Progress Notes (Signed)
Inpatient Diabetes Program Recommendations  AACE/ADA: New Consensus Statement on Inpatient Glycemic Control (2015)  Target Ranges:  Prepandial:   less than 140 mg/dL      Peak postprandial:   less than 180 mg/dL (1-2 hours)      Critically ill patients:  140 - 180 mg/dL   Lab Results  Component Value Date   GLUCAP 68 (L) 04/21/2018   HGBA1C 8.2 (H) 01/14/2018    Review of Glycemic Control  Diabetes history:Type 2 DM Outpatient Diabetes medications:Lantus 10 units QHS Current orders for Inpatient glycemic control:Lantus 7 units QHS, Lantus 17 units QAM, Novolog7units TID, Novolog 0-16 units TID  Inpatient Diabetes Program Recommendations:   Noted hypoglycemia this AM of 68 mg/dL, would recommend decreasing Lantus back to 5 units QHS.   Thanks,  Lujean RaveLauren Edenilson Austad, MSN, RNC-OB Diabetes Coordinator [phone] (8a-5p)

## 2018-04-21 NOTE — Progress Notes (Addendum)
Daily Antepartum Note  Admission Date: 04/02/2018 Current Date: 04/20/2018 9:31 AM  Kathryn Shelton is a 27 y.o. [id number] @ [redacted]w[redacted]d, HD#19, admitted for PPROM (since 17wks).  Pregnancy complicated by: Patient Active Problem List   Diagnosis Date Noted  . Oligohydramnios antepartum, second trimester, other fetus 03/09/2018  . Obesity, morbid (HCC) 03/09/2018  . Preterm premature rupture of membranes, unspecified as to length of time between rupture and onset of labor, second trimester 03/09/2018  . Supervision of high risk pregnancy, antepartum 12/31/2017  . Chronic hypertension during pregnancy, antepartum 12/31/2017  . Pre-existing type 2 diabetes mellitus during pregnancy, antepartum 12/31/2017  . Mixed hyperlipidemia 12/02/2017  . Type II diabetes mellitus, uncontrolled (HCC) 03/01/2017  . Essential hypertension 03/01/2017  . Intermittent palpitations 03/01/2017  . Panic attack 03/01/2017  . Obstructive sleep apnea syndrome 03/01/2017    Overnight/24hr events:  none  Subjective:  No s/s of infection, PTL  Objective:    Current Vital Signs 24h Vital Sign Ranges  T 98.4 F (36.9 C) Temp  Avg: 98.6 F (37 C)  Min: 98.3 F (36.8 C)  Max: 99.1 F (37.3 C)  BP 115/76 BP  Min: 104/63  Max: 123/97  HR 100 Pulse  Avg: 93.8  Min: 86  Max: 100  RR 18 Resp  Avg: 17.3  Min: 16  Max: 18  SaO2 99 % Room Air SpO2  Avg: 98.8 %  Min: 98 %  Max: 100 %       24 Hour I/O Current Shift I/O  Time Ins Outs No intake/output data recorded. No intake/output data recorded.   EFM: 155 baseline, + accels, no decel, mod variability Toco: quiet x 27m  Physical exam: General: Well nourished, well developed female in no acute distress. Abdomen: gravid nttp Cardiovascular: S1, S2 normal, no murmur, rub or gallop, regular rate and rhythm Respiratory: CTAB Extremities: no clubbing, cyanosis or edema Skin: Warm and dry.   Medications: Current Facility-Administered Medications  Medication Dose  Route Frequency Provider Last Rate Last Dose  . 0.9 %  sodium chloride infusion   Intravenous PRN Conan Bowens, MD   Stopped at 04/05/18 [phone]  . acetaminophen (TYLENOL) tablet 650 mg  650 mg Oral Q4H PRN Conan Bowens, MD   650 mg at 04/11/18 2349  . aspirin EC tablet 81 mg  81 mg Oral Daily Conan Bowens, MD   81 mg at 04/20/18 1037  . calcium carbonate (TUMS - dosed in mg elemental calcium) chewable tablet 400 mg of elemental calcium  2 tablet Oral Q4H PRN Conan Bowens, MD      . docusate sodium (COLACE) capsule 100 mg  100 mg Oral Daily Conan Bowens, MD   100 mg at 04/20/18 1037  . Influenza vac split quadrivalent PF (FLUARIX) injection 0.5 mL  0.5 mL Intramuscular Tomorrow-1000 Conan Bowens, MD      . insulin aspart (novoLOG) injection 0-16 Units  0-16 Units Subcutaneous TID PC Anyanwu, Jethro Bastos, MD   4 Units at 04/20/18 2222  . insulin aspart (novoLOG) injection 7 Units  7 Units Subcutaneous TID WC [place] Bing, MD   7 Units at 04/21/18 0816  . insulin glargine (LANTUS) injection 17 Units  17 Units Subcutaneous QAC breakfast [place] Bing, MD   17 Units at 04/21/18 0750  . insulin glargine (LANTUS) injection 7 Units  7 Units Subcutaneous QHS [place] Bing, MD   7 Units at 04/20/18 2221  . prenatal multivitamin tablet 1 tablet  1  tablet Oral Q1200 Conan Bowensavis, Kelly M, MD   1 tablet at 04/20/18 1259    Labs:  Recent Labs  Lab 04/17/18 0742  WBC 5.2  HGB 10.8*  HCT 32.9*  PLT 298   Results for [name], Rosalynn (MRN [id number]) as of 04/20/2018 09:32  Ref. Range 04/19/2018 16:15 04/19/2018 16:30 04/19/2018 16:46 04/19/2018 18:34 04/19/2018 20:54 04/20/2018 02:41 04/20/2018 05:24 04/20/2018 08:26 04/20/2018 08:53  Glucose-Capillary Latest Ref Range: 70 - 99 mg/dL 47 (L) 48 (L) 92 409206 (H) 117 (H) 117 (H)  69 (L) 85   Radiology: no new imaging -11/23: ceph, oligo -11/8: ceph, 54910m, efw 58%, AC 67%  Assessment & Plan:  Pt doing well *Pregnancy: reactive NST. Routine  care. -not sure about BTL *DM2: Continue current regimen and with am fasting and 2h post prandial checks. DM team following. Appreciate recommendations *Preterm: no current issues. Will hold off on bmz rescue course since she's been so stable.  -s/p NICU consult -s/p BMZ 11/8 and 11/9 -s/p Mg on admission *PPROM: no current issues -s/p latency abx *PPx: SCDs, OOB *FEN/GI: DM diet *Dispo: pending delivery  Cornelia Copaharlie Makayli Bracken, Jr. MD Attending Center for [facility](Faculty Practice)

## 2018-04-21 NOTE — Progress Notes (Signed)
Patient refused fetal monitoring for tonight.

## 2018-04-22 LAB — GLUCOSE, CAPILLARY
GLUCOSE-CAPILLARY: 198 mg/dL — AB (ref 70–99)
Glucose-Capillary: 60 mg/dL — ABNORMAL LOW (ref 70–99)
Glucose-Capillary: 111 mg/dL — ABNORMAL HIGH (ref 70–99)
Glucose-Capillary: 238 mg/dL — ABNORMAL HIGH (ref 70–99)

## 2018-04-22 NOTE — Progress Notes (Signed)
FACULTY PRACTICE ANTEPARTUM PROGRESS NOTE  Ivie Savitt is a 27 y.o. G3P2002 at [redacted]w[redacted]d who is admitted for PPROM.  Estimated Date of Delivery: 08/01/18 Fetal presentation is cephalic.  Length of Stay:  20 Days. Admitted 04/02/2018  Subjective:  Patient reports normal fetal movement.  She denies uterine contractions, denies bleeding, reports a little bit of leaking. Feeling well, no complaints.  Vitals:  Blood pressure 100/89, pulse 95, temperature 97.6 F (36.4 C), temperature source Oral, resp. rate 18, height 5\' 6"  (1.676 m), weight 96.6 kg, last menstrual period 10/07/2017, SpO2 100 %. Physical Examination: CONSTITUTIONAL: Well-developed, well-nourished female in no acute distress.  HENT:  Normocephalic, atraumatic, External right and left ear normal. Oropharynx is clear and moist EYES: Conjunctivae and EOM are normal. Pupils are equal, round, and reactive to light. No scleral icterus.  NECK: Normal range of motion, supple, no masses. SKIN: Skin is warm and dry. No rash noted. Not diaphoretic. No erythema. No pallor. NEUROLGIC: Alert and oriented to person, place, and time. Normal reflexes, muscle tone coordination. No cranial nerve deficit noted. PSYCHIATRIC: Normal mood and affect. Normal behavior. Normal judgment and thought content. CARDIOVASCULAR: Normal heart rate noted, regular rhythm RESPIRATORY: Effort and breath sounds normal, no problems with respiration noted MUSCULOSKELETAL: Normal range of motion. No edema and no tenderness. ABDOMEN: Soft, nontender, nondistended, gravid. CERVIX: deferred  Fetal monitoring: appropriate yesterday afternoon, patient refused monitoring last pm, has not been on monitor yet this am  Results for orders placed or performed during the hospital encounter of 04/02/18 (from the past 48 hour(s))  Glucose, capillary     Status: None   Collection Time: 04/20/18 12:14 PM  Result Value Ref Range   Glucose-Capillary 80 70 - 99 mg/dL  Glucose,  capillary     Status: Abnormal   Collection Time: 04/20/18  3:10 PM  Result Value Ref Range   Glucose-Capillary 100 (H) 70 - 99 mg/dL   Comment 1 Notify RN    Comment 2 Document in Chart   Glucose, capillary     Status: Abnormal   Collection Time: 04/20/18  9:17 PM  Result Value Ref Range   Glucose-Capillary 144 (H) 70 - 99 mg/dL  Glucose, capillary     Status: Abnormal   Collection Time: 04/20/18 10:12 PM  Result Value Ref Range   Glucose-Capillary 176 (H) 70 - 99 mg/dL  Glucose, capillary     Status: Abnormal   Collection Time: 04/21/18  5:30 AM  Result Value Ref Range   Glucose-Capillary 68 (L) 70 - 99 mg/dL  Glucose, capillary     Status: Abnormal   Collection Time: 04/21/18  9:55 AM  Result Value Ref Range   Glucose-Capillary 126 (H) 70 - 99 mg/dL  Glucose, capillary     Status: Abnormal   Collection Time: 04/21/18  2:30 PM  Result Value Ref Range   Glucose-Capillary 150 (H) 70 - 99 mg/dL  Glucose, capillary     Status: Abnormal   Collection Time: 04/21/18  8:17 PM  Result Value Ref Range   Glucose-Capillary 114 (H) 70 - 99 mg/dL  Glucose, capillary     Status: Abnormal   Collection Time: 04/22/18  3:07 AM  Result Value Ref Range   Glucose-Capillary 60 (L) 70 - 99 mg/dL  Glucose, capillary     Status: Abnormal   Collection Time: 04/22/18  3:53 AM  Result Value Ref Range   Glucose-Capillary 111 (H) 70 - 99 mg/dL    No results found.  Current scheduled  medications . aspirin EC  81 mg Oral Daily  . docusate sodium  100 mg Oral Daily  . Influenza vac split quadrivalent PF  0.5 mL Intramuscular Tomorrow-1000  . insulin aspart  0-16 Units Subcutaneous TID PC  . insulin aspart  7 Units Subcutaneous TID WC  . insulin glargine  17 Units Subcutaneous QAC breakfast  . insulin glargine  5 Units Subcutaneous QHS  . prenatal multivitamin  1 tablet Oral Q1200    I have reviewed the patient's current medications.  Radiology: no new imaging -11/23: ceph, oligo -11/8:  ceph, 53369m, efw 58%, AC 67%  ASSESSMENT: Principal Problem:   Preterm premature rupture of membranes, unspecified as to length of time between rupture and onset of labor, second trimester Active Problems:   Pre-existing type 2 diabetes mellitus during pregnancy, antepartum   PLAN: S/p BTMZ 11/8-9 S/p NICU consult S/p MgSO4 S/p latency abx DM2: lantus 17 Q am, 5 Q pm, novolog TID with meals BS still labile, will cont to adjust meds Regular diet per patient preference Repeat US Friday   Continue routine antenatal care.   Baldemar LenisK. Meryl Davis, M.D. Attending Center for [facility] (Faculty Practice)  04/22/2018 9:37 AM

## 2018-04-23 ENCOUNTER — Inpatient Hospital Stay (HOSPITAL_BASED_OUTPATIENT_CLINIC_OR_DEPARTMENT_OTHER): Payer: Medicaid Other

## 2018-04-23 DIAGNOSIS — Z3A25 25 weeks gestation of pregnancy: Secondary | ICD-10-CM

## 2018-04-23 DIAGNOSIS — O10012 Pre-existing essential hypertension complicating pregnancy, second trimester: Secondary | ICD-10-CM

## 2018-04-23 DIAGNOSIS — O24112 Pre-existing diabetes mellitus, type 2, in pregnancy, second trimester: Secondary | ICD-10-CM

## 2018-04-23 DIAGNOSIS — O42912 Preterm premature rupture of membranes, unspecified as to length of time between rupture and onset of labor, second trimester: Secondary | ICD-10-CM

## 2018-04-23 LAB — GLUCOSE, CAPILLARY
GLUCOSE-CAPILLARY: 76 mg/dL (ref 70–99)
Glucose-Capillary: 116 mg/dL — ABNORMAL HIGH (ref 70–99)
Glucose-Capillary: 143 mg/dL — ABNORMAL HIGH (ref 70–99)

## 2018-04-23 LAB — TYPE AND SCREEN
ABO/RH(D): A POS
Antibody Screen: NEGATIVE

## 2018-04-23 NOTE — Progress Notes (Signed)
Pt refuses EFM for the night. States she "does not want to lay still for 30 minutes" because she is "too tired". RN educated and encouraged monitoring. Pt instructed to call out if she changes her mind.  Arva ChafeMeghan E Kemisha Bonnette, RN

## 2018-04-23 NOTE — Progress Notes (Addendum)
MD aware pt has left the floor and has not return to get her 2 hr PP sugar check

## 2018-04-23 NOTE — Progress Notes (Signed)
Pt refusing monitoring this morning

## 2018-04-23 NOTE — Progress Notes (Signed)
FACULTY PRACTICE ANTEPARTUM PROGRESS NOTE  Kathryn Shelton is a 27 y.o. G3P2002 at [redacted]w[redacted]d who is admitted for PPROM.  Estimated Date of Delivery: 08/01/18 Fetal presentation is cephalic.  Length of Stay:  21 Days. Admitted 04/02/2018  Subjective:  Patient reports normal fetal movement.  She denies uterine contractions, denies bleeding, reports a little bit of leaking. Feeling well, no complaints.  Vitals:  Blood pressure 102/63, pulse 98, temperature 98.8 F (37.1 C), temperature source Oral, resp. rate 18, height 5\' 6"  (1.676 m), weight 96.6 kg, last menstrual period 10/07/2017, SpO2 100 %. Physical Examination: CONSTITUTIONAL: Well-developed, well-nourished female in no acute distress.  HENT:  Normocephalic, atraumatic, External right and left ear normal. Oropharynx is clear and moist EYES: Conjunctivae and EOM are normal. Pupils are equal, round, and reactive to light. No scleral icterus.  NECK: Normal range of motion, supple, no masses. SKIN: Skin is warm and dry. No rash noted. Not diaphoretic. No erythema. No pallor. NEUROLGIC: Alert and oriented to person, place, and time. Normal reflexes, muscle tone coordination. No cranial nerve deficit noted. PSYCHIATRIC: Normal mood and affect. Normal behavior. Normal judgment and thought content. CARDIOVASCULAR: Normal heart rate noted, regular rhythm RESPIRATORY: Effort and breath sounds normal, no problems with respiration noted MUSCULOSKELETAL: Normal range of motion. No edema and no tenderness. ABDOMEN: Soft, nontender, nondistended, gravid. CERVIX: deferred  Fetal monitoring: 145 bpm, mod var, small accels, no decels  Labs and Imaging: Results for orders placed or performed during the hospital encounter of 04/02/18 (from the past 48 hour(s))  Glucose, capillary     Status: Abnormal   Collection Time: 04/21/18  8:17 PM  Result Value Ref Range   Glucose-Capillary 114 (H) 70 - 99 mg/dL  Glucose, capillary     Status: Abnormal   Collection Time: 04/22/18  3:07 AM  Result Value Ref Range   Glucose-Capillary 60 (L) 70 - 99 mg/dL  Glucose, capillary     Status: Abnormal   Collection Time: 04/22/18  3:53 AM  Result Value Ref Range   Glucose-Capillary 111 (H) 70 - 99 mg/dL  Glucose, capillary     Status: Abnormal   Collection Time: 04/22/18 10:34 AM  Result Value Ref Range   Glucose-Capillary 198 (H) 70 - 99 mg/dL  Glucose, capillary     Status: Abnormal   Collection Time: 04/22/18  8:15 PM  Result Value Ref Range   Glucose-Capillary 238 (H) 70 - 99 mg/dL  Type and screen [facility] [place]     Status: None   Collection Time: 04/23/18  7:35 AM  Result Value Ref Range   ABO/RH(D) A POS    Antibody Screen NEG    Sample Expiration      04/26/2018 Performed at [facility], [address] [place], [place] [address]   Glucose, capillary     Status: None   Collection Time: 04/23/18  8:01 AM  Result Value Ref Range   Glucose-Capillary 76 70 - 99 mg/dL  Glucose, capillary     Status: Abnormal   Collection Time: 04/23/18 10:39 AM  Result Value Ref Range   Glucose-Capillary 116 (H) 70 - 99 mg/dL   Comment 1 Notify RN     [place] Mfm Ob Follow Up  Result Date: 04/23/2018 ----------------------------------------------------------------------  OBSTETRICS REPORT                        (Signed Final 04/23/2018 10:17 am) ---------------------------------------------------------------------- Patient Info  ID #:  [id number]                          D.O.B.:  1990-07-04 (27 yrs)  Name:       Kathryn Shelton                  Visit Date: 04/23/2018 08:03 am ---------------------------------------------------------------------- Performed By  Performed By:     Birdena Crandall        Ref. Address:      [address]                    RDMS,RVT                                                              Rd  Attending:        Lin Landsman      Secondary Phy.:    3rd Nursing- HR                    MD                                                               OB                                                              3rd Floor  Referred By:      Conan Bowens          Location:          [facility]                    MD ---------------------------------------------------------------------- Orders   #  Description                          Code         Ordered By   1  [place] MFM OB FOLLOW UP                  40981.19     KELLY DAVIS  ----------------------------------------------------------------------   #  Order #                    Accession #                 Episode #   1  [id number]                  [id number]                  [id number]  ---------------------------------------------------------------------- Indications   Premature rupture of membranes - leaking       O42.90   fluid   Hypertension - Chronic/Pre-existing (no        O10.019   meds)  Pre-existing diabetes, type 2, in pregnancy,   O24.112   second trimester (metformin, insulin, not   currently taking either)   [redacted] weeks gestation of pregnancy                Z3A.25  ---------------------------------------------------------------------- Fetal Evaluation  Num Of Fetuses:          1  Fetal Heart Rate(bpm):   131  Cardiac Activity:        Observed  Presentation:            Cephalic  Placenta:                Right lateral  Amniotic Fluid  AFI FV:      Oligohydramnios                              Largest Pocket(cm)                              3.08 ---------------------------------------------------------------------- Biometry  BPD:      64.2  mm     G. Age:  26w 0d         49  %    CI:        70.73   %    70 - 86                                                          FL/HC:       18.6  %    18.6 - 20.4  HC:      243.3  mm     G. Age:  26w 3d         51  %    HC/AC:       1.10       1.04 - 1.22  AC:      220.6  mm     G. Age:  26w 3d         66  %    FL/BPD:      70.4  %    71 - 87  FL:       45.2  mm     G. Age:  25w 0d         17  %    FL/AC:       20.5  %     20 - 24  HUM:      41.6  mm     G. Age:  25w 1d         29  %  LV:        7.6  mm  Est. FW:     868   gm   1 lb 15 oz      58  % ---------------------------------------------------------------------- OB History  Gravidity:    3         Term:   2        Prem:   0        SAB:   0  TOP:          0       Ectopic:  0        Living:  2 ---------------------------------------------------------------------- Gestational Age  LMP:           28w 2d        Date:  10/07/17                 EDD:   07/14/18  U/S Today:     26w 0d                                        EDD:   07/30/18  Best:          25w 5d     Det. ByMarcella Dubs:  Early Ultrasound         EDD:   08/01/18                                      (12/22/17) ---------------------------------------------------------------------- Anatomy  Ventricles:            Appears normal         Stomach:                Appears normal, left                                                                        sided  Cerebellum:            Appears normal         Kidneys:                Appear normal  Posterior Fossa:       Appears normal         Bladder:                Appears normal ---------------------------------------------------------------------- Cervix Uterus Adnexa  Cervix  Length:           3.84  cm.  Normal appearance by transabdominal scan. ---------------------------------------------------------------------- Impression  PPROM  Inpatient  MVP 3 however subjectively low fluid. ---------------------------------------------------------------------- Recommendations  Follow up per inpatient team. ----------------------------------------------------------------------               Lin Landsmanorenthian Booker, MD Electronically Signed Final Report   04/23/2018 10:17 am ----------------------------------------------------------------------   Current scheduled medications . aspirin EC  81 mg Oral Daily  . docusate sodium  100 mg Oral Daily  . Influenza vac split quadrivalent PF  0.5 mL  Intramuscular Tomorrow-1000  . insulin aspart  0-16 Units Subcutaneous TID PC  . insulin aspart  7 Units Subcutaneous TID WC  . insulin glargine  17 Units Subcutaneous QAC breakfast  . insulin glargine  5 Units Subcutaneous QHS  . prenatal multivitamin  1 tablet Oral Q1200    I have reviewed the patient's current medications.  ASSESSMENT: Principal Problem:   Preterm premature rupture of membranes, unspecified as to length of time between rupture and onset of labor, second trimester Active Problems:   Pre-existing type 2 diabetes mellitus during pregnancy, antepartum   PLAN: S/p BTMZ 11/8-9 S/p NICU consult S/p MgSO4 S/p latency abx DM2: lantus 17 Q am, 5 Q pm, novolog TID with meals BS still  labile, will cont to adjust meds as needed Regular diet per patient preference Continue routine antenatal care.   Jaynie Collins, MD, FACOG Obstetrician & Gynecologist, [facility] for [employer], [facility]

## 2018-04-23 NOTE — Progress Notes (Signed)
MD notified of patient leaving floor and not returning for 2 hours

## 2018-04-23 NOTE — Progress Notes (Signed)
Pt left unit at 0930 and has not returned

## 2018-04-24 ENCOUNTER — Encounter (HOSPITAL_COMMUNITY): Payer: Self-pay

## 2018-04-24 LAB — GLUCOSE, CAPILLARY
Glucose-Capillary: 80 mg/dL (ref 70–99)
Glucose-Capillary: 79 mg/dL (ref 70–99)
Glucose-Capillary: 93 mg/dL (ref 70–99)
Glucose-Capillary: 103 mg/dL — ABNORMAL HIGH (ref 70–99)

## 2018-04-24 NOTE — Progress Notes (Signed)
Pt threatening to leave hospital but states she will talk to physician. Dr. Earlene Plateravis notified and at bedside. Carmelina DaneERRI L Kamarri Lovvorn, RN

## 2018-04-24 NOTE — Progress Notes (Signed)
Called to room by RN for patient yelling and threatening to leave. In to patient room, she is on phone with [place] who is in [place], she verbalizes okay to discuss in front of husband.   Patient very upset because she has been told she cannot leave hospital premises by staff (staff thinks she has been leaving campus). She states she has bills to pay and there is no one to help her. States she is trying to keep her housing so she has a place to take baby once baby is born.   Reviewed significant risks of leaving AMA to her health including risk of infection and death, as well as risks to infant including risk of premature delivery, fetal death. Reviewed that once off hospital premises, we cannot intervene if necessary. She verbalizes understanding of the above risks. Offered social services to give resources for financial assistance, she does not want to get involved with social services, reviewed they are available for help if she desires. Reviewed that she will have to sign papers stating she is leaving against medical advice if she decides to leave, she verbalizes understanding.   Patient undecided about leaving AMA, she will discuss with husband, after discussion, she has opted to stay in hospital. She does not want to see social work at this point, offered that she can opt to see them at any time.   Baldemar LenisK. Meryl Davis, M.D. Attending [facility](Faculty Practice)

## 2018-04-24 NOTE — Progress Notes (Signed)
FACULTY PRACTICE ANTEPARTUM PROGRESS NOTE  Jule SerOctavia Morell is a 27 y.o. G3P2002 at 3057w6d who is admitted for PPROM.  Estimated Date of Delivery: 08/01/18 Fetal presentation is cephalic.  Length of Stay:  22 Days. Admitted 04/02/2018  Subjective: No complaints Patient reports normal fetal movement.  She denies uterine contractions, denies bleeding, reports a little bit of leaking. Feeling well, no complaints.  Vitals:  Blood pressure 109/74, pulse 100, temperature 98.3 F (36.8 C), resp. rate 16, height 5\' 6"  (1.676 m), weight 96.6 kg, last menstrual period 10/07/2017, SpO2 97 %. Physical Examination: CONSTITUTIONAL: Well-developed, well-nourished female in no acute distress.  HENT:  Normocephalic, atraumatic, External right and left ear normal. Oropharynx is clear and moist EYES: Conjunctivae and EOM are normal. Pupils are equal, round, and reactive to light. No scleral icterus.  NECK: Normal range of motion, supple, no masses. NEUROLGIC: Alert and oriented to person, place, and time. Normal reflexes, muscle tone coordination. No cranial nerve deficit noted. PSYCHIATRIC: Normal mood and affect. Normal behavior. Normal judgment and thought content. CARDIOVASCULAR: Normal heart rate noted, regular rhythm RESPIRATORY: Effort and breath sounds normal, no problems with respiration noted MUSCULOSKELETAL: Normal range of motion. No edema and no tenderness. ABDOMEN: Soft, nontender, nondistended, gravid. CERVIX: deferred  Fetal monitoring: 145 bpm, mod var, small accels, no decels  Labs and Imaging: Results for orders placed or performed during the hospital encounter of 04/02/18 (from the past 48 hour(s))  Glucose, capillary     Status: Abnormal   Collection Time: 04/22/18 10:34 AM  Result Value Ref Range   Glucose-Capillary 198 (H) 70 - 99 mg/dL  Glucose, capillary     Status: Abnormal   Collection Time: 04/22/18  8:15 PM  Result Value Ref Range   Glucose-Capillary 238 (H) 70 - 99 mg/dL   Type [facility]     Status: None   Collection Time: 04/23/18  7:35 AM  Result Value Ref Range   ABO/RH(D) A POS    Antibody Screen NEG    Sample Expiration      04/26/2018 Performed at [facility], [address] [place], [place] [id number]   Glucose, capillary     Status: None   Collection Time: 04/23/18  8:01 AM  Result Value Ref Range   Glucose-Capillary 76 70 - 99 mg/dL  Glucose, capillary     Status: Abnormal   Collection Time: 04/23/18 10:39 AM  Result Value Ref Range   Glucose-Capillary 116 (H) 70 - 99 mg/dL   Comment 1 Notify RN   Glucose, capillary     Status: Abnormal   Collection Time: 04/23/18 10:03 PM  Result Value Ref Range   Glucose-Capillary 143 (H) 70 - 99 mg/dL  Glucose, capillary     Status: None   Collection Time: 04/24/18  3:13 AM  Result Value Ref Range   Glucose-Capillary 80 70 - 99 mg/dL    [place] Mfm Ob Follow Up  Result Date: 04/23/2018 ----------------------------------------------------------------------  OBSTETRICS REPORT                        (Signed Final 04/23/2018 10:17 am) ---------------------------------------------------------------------- Patient Info  ID #:       [id number]                          D.O.B.:  1990-10-28 (27 yrs)  Name:       Jule SerAVIA Dimock  Visit Date: 04/23/2018 08:03 am ---------------------------------------------------------------------- Performed By  Performed By:     Birdena Crandall        Ref. Address:      [address]                    [address]MS,RVT                                                              Rd  Attending:        Lin Landsman      Secondary Phy.:    3rd Nursing- HR                    MD                                                              OB                                                              3rd Floor  Referred By:      Conan Bowens          Location:          [facility]                    MD  ---------------------------------------------------------------------- Orders   #  Description                          Code         Ordered By   1  [place] MFM OB FOLLOW UP                  29562.13     KELLY DAVIS  ----------------------------------------------------------------------   #  Order #                    Accession #                 Episode #   1  [id number]                  [id number]                  [id number]  ---------------------------------------------------------------------- Indications   Premature rupture of membranes - leaking       O42.90   fluid   Hypertension - Chronic/Pre-existing (no        O10.019   meds)   Pre-existing diabetes, type 2, in pregnancy,   O24.112   second trimester (metformin, insulin, not   currently taking either)   [redacted] weeks gestation of pregnancy                Z3A.25  ---------------------------------------------------------------------- Fetal Evaluation  Num Of Fetuses:  1  Fetal Heart Rate(bpm):   131  Cardiac Activity:        Observed  Presentation:            Cephalic  Placenta:                Right lateral  Amniotic Fluid  AFI FV:      Oligohydramnios                              Largest Pocket(cm)                              3.08 ---------------------------------------------------------------------- Biometry  BPD:      64.2  mm     G. Age:  26w 0d         49  %    CI:        70.73   %    70 - 86                                                          FL/HC:       18.6  %    18.6 - 20.4  HC:      243.3  mm     G. Age:  26w 3d         51  %    HC/AC:       1.10       1.04 - 1.22  AC:      220.6  mm     G. Age:  26w 3d         66  %    FL/BPD:      70.4  %    71 - 87  FL:       45.2  mm     G. Age:  25w 0d         17  %    FL/AC:       20.5  %    20 - 24  HUM:      41.6  mm     G. Age:  25w 1d         29  %  LV:        7.6  mm  Est. FW:     868   gm   1 lb 15 oz      58  % ---------------------------------------------------------------------- OB History   Gravidity:    3         Term:   2        Prem:   0        SAB:   0  TOP:          0       Ectopic:  0        Living: 2 ---------------------------------------------------------------------- Gestational Age  LMP:           28w 2d        Date:  10/07/17                 EDD:   07/14/18  U/S Today:     26w 0d  EDD:   07/30/18  Best:          25w 5d     Det. ByMarcella Dubs         EDD:   08/01/18                                      (12/22/17) ---------------------------------------------------------------------- Anatomy  Ventricles:            Appears normal         Stomach:                Appears normal, left                                                                        sided  Cerebellum:            Appears normal         Kidneys:                Appear normal  Posterior Fossa:       Appears normal         Bladder:                Appears normal ---------------------------------------------------------------------- Cervix Uterus Adnexa  Cervix  Length:           3.84  cm.  Normal appearance by transabdominal scan. ---------------------------------------------------------------------- Impression  PPROM  Inpatient  MVP 3 however subjectively low fluid. ---------------------------------------------------------------------- Recommendations  Follow up per inpatient team. ----------------------------------------------------------------------               Lin Landsman, MD Electronically Signed Final Report   04/23/2018 10:17 am ----------------------------------------------------------------------   Current scheduled medications . aspirin EC  81 mg Oral Daily  . docusate sodium  100 mg Oral Daily  . Influenza vac split quadrivalent PF  0.5 mL Intramuscular Tomorrow-1000  . insulin aspart  0-16 Units Subcutaneous TID PC  . insulin aspart  7 Units Subcutaneous TID WC  . insulin glargine  17 Units Subcutaneous QAC breakfast  . insulin glargine  5 Units  Subcutaneous QHS  . prenatal multivitamin  1 tablet Oral Q1200    I have reviewed the patient's current medications.  ASSESSMENT: Principal Problem:   Preterm premature rupture of membranes, unspecified as to length of time between rupture and onset of labor, second trimester Active Problems:   Pre-existing type 2 diabetes mellitus during pregnancy, antepartum   PLAN: S/p BTMZ 11/8-9 S/p NICU consult S/p MgSO4 S/p latency abx DM2: lantus 17 Q am, 5 Q pm, novolog TID with meals BP stable Regular diet per patient preference Continue routine antenatal care.   Jaynie Collins, MD, FACOG Obstetrician & Gynecologist, [facility] [employer], [facility]

## 2018-04-24 NOTE — Plan of Care (Signed)
Pt left unit and came back feeling contractions/cramping and leaking more fluid. RN educated about P-PROM and the need for bedrest. Pt verbalized understanding.

## 2018-04-24 NOTE — Progress Notes (Signed)
Pt refusing to be place on monitor this morning. Pt educated on importance. Will continue to encourage. Pt denies any vaginal bleeding or contractions.  Carmelina DaneERRI L Tamecka Milham, RN

## 2018-04-24 NOTE — Progress Notes (Signed)
Pt has not been in room and remains gone at this time. Still in need of fetal monitoring.Pt has been educated on the dangers of leaving the hospital/unit. Carmelina DaneERRI L Malori Myers, RN

## 2018-04-24 NOTE — Progress Notes (Signed)
Pt appears to be in verbal altercation on telephone. Pt yelling and can be heard in the hallways. Pt instructed to stop yelling due to patients on both sides of her. Pt did not acknowledge me. Will call security if continues. Carmelina DaneERRI L Jermanie Minshall, RN

## 2018-04-25 LAB — CBC WITH DIFFERENTIAL/PLATELET
Basophils Absolute: 0 10*3/uL (ref 0.0–0.1)
Basophils Relative: 0 %
Eosinophils Absolute: 0.2 10*3/uL (ref 0.0–0.5)
Eosinophils Relative: 5 %
HCT: 31.7 % — ABNORMAL LOW (ref 36.0–46.0)
Hemoglobin: 10.5 g/dL — ABNORMAL LOW (ref 12.0–15.0)
LYMPHS PCT: 28 %
Lymphs Abs: 1.2 10*3/uL (ref 0.7–4.0)
MCH: 27.7 pg (ref 26.0–34.0)
MCHC: 33.1 g/dL (ref 30.0–36.0)
MCV: 83.6 fL (ref 80.0–100.0)
MONOS PCT: 4 %
Monocytes Absolute: 0.2 10*3/uL (ref 0.1–1.0)
Neutro Abs: 2.8 10*3/uL (ref 1.7–7.7)
Neutrophils Relative %: 63 %
Platelets: 242 10*3/uL (ref 150–400)
RBC: 3.79 MIL/uL — ABNORMAL LOW (ref 3.87–5.11)
RDW: 13.5 % (ref 11.5–15.5)
WBC: 4.4 10*3/uL (ref 4.0–10.5)
nRBC: 0 % (ref 0.0–0.2)

## 2018-04-25 LAB — GLUCOSE, CAPILLARY
Glucose-Capillary: 80 mg/dL (ref 70–99)
Glucose-Capillary: 70 mg/dL (ref 70–99)
Glucose-Capillary: 114 mg/dL — ABNORMAL HIGH (ref 70–99)
Glucose-Capillary: 100 mg/dL — ABNORMAL HIGH (ref 70–99)
Glucose-Capillary: 146 mg/dL — ABNORMAL HIGH (ref 70–99)

## 2018-04-25 NOTE — Progress Notes (Signed)
Pt off unit when 1500 CBG was due

## 2018-04-25 NOTE — Progress Notes (Signed)
Pt took off monitors at 25 minutes and is in the bathroom.  Will stress importance of 30 min NST with patient.

## 2018-04-25 NOTE — Progress Notes (Signed)
FACULTY PRACTICE ANTEPARTUM PROGRESS NOTE  [name] is a 27 y.o. G3P2002 at [redacted]w[redacted]d who is admitted for PPROM.  Estimated Date of Delivery: 08/01/18 Fetal presentation is cephalic.  Length of Stay:  23 Days. Admitted 04/02/2018  Subjective:  Patient reports normal fetal movement.  She reports her contractions have improved, she is still feeling them but hey are much less painful. Does report some leaking, denies bleeding.   Vitals:  Blood pressure 112/74, pulse 92, temperature 98.8 F (37.1 C), temperature source Oral, resp. rate 16, height 5\' 6"  (1.676 m), weight 96.6 kg, last menstrual period 10/07/2017, SpO2 100 %. Physical Examination: CONSTITUTIONAL: Well-developed, well-nourished female in no acute distress.  HENT:  Normocephalic, atraumatic, External right and left ear normal. Oropharynx is clear and moist EYES: Conjunctivae and EOM are normal. Pupils are equal, round, and reactive to light. No scleral icterus.  NECK: Normal range of motion, supple, no masses. SKIN: Skin is warm and dry. No rash noted. Not diaphoretic. No erythema. No pallor. NEUROLGIC: Alert and oriented to person, place, and time. Normal reflexes, muscle tone coordination. No cranial nerve deficit noted. PSYCHIATRIC: Normal mood and affect. Normal behavior. Normal judgment and thought content. CARDIOVASCULAR: Normal heart rate noted, regular rhythm RESPIRATORY: Effort and breath sounds normal, no problems with respiration noted MUSCULOSKELETAL: Normal range of motion. No edema and no tenderness. ABDOMEN: Soft, nontender, nondistended, gravid. CERVIX: deferred  Fetal monitoring: FHR: 140 bpm, Variability: moderate, Accelerations: Present, Decelerations: Absent  Uterine activity: no contractions per hour  Results for orders placed or performed during the hospital encounter of 04/02/18 (from the past 48 hour(s))  Type and screen [facility] [place]     Status: None   Collection Time: 04/23/18  7:35  AM  Result Value Ref Range   ABO/RH(D) A POS    Antibody Screen NEG    Sample Expiration      04/26/2018 Performed at [facility], [address], [place], [place] [address]   Glucose, capillary     Status: None   Collection Time: 04/23/18  8:01 AM  Result Value Ref Range   Glucose-Capillary 76 70 - 99 mg/dL  Glucose, capillary     Status: Abnormal   Collection Time: 04/23/18 10:39 AM  Result Value Ref Range   Glucose-Capillary 116 (H) 70 - 99 mg/dL   Comment 1 Notify RN   Glucose, capillary     Status: Abnormal   Collection Time: 04/23/18 10:03 PM  Result Value Ref Range   Glucose-Capillary 143 (H) 70 - 99 mg/dL  Glucose, capillary     Status: None   Collection Time: 04/24/18  3:13 AM  Result Value Ref Range   Glucose-Capillary 80 70 - 99 mg/dL  Glucose, capillary     Status: None   Collection Time: 04/24/18  8:11 AM  Result Value Ref Range   Glucose-Capillary 79 70 - 99 mg/dL  Glucose, capillary     Status: None   Collection Time: 04/24/18 10:43 AM  Result Value Ref Range   Glucose-Capillary 93 70 - 99 mg/dL  Glucose, capillary     Status: Abnormal   Collection Time: 04/24/18  8:08 PM  Result Value Ref Range   Glucose-Capillary 103 (H) 70 - 99 mg/dL   Comment 1 Notify RN   Glucose, capillary     Status: None   Collection Time: 04/25/18  3:07 AM  Result Value Ref Range   Glucose-Capillary 80 70 - 99 mg/dL   Comment 1 Notify RN  [place] Mfm Ob Follow Up  Result Date: 04/23/2018 ----------------------------------------------------------------------  OBSTETRICS REPORT                        (Signed Final 04/23/2018 10:17 am) ---------------------------------------------------------------------- Patient Info  ID #:       [id number]                          D.O.B.:  12-Jun-1990 (27 yrs)  Name:       Kathryn Shelton                  Visit Date: 04/23/2018 08:03 am ---------------------------------------------------------------------- Performed By  Performed By:     Birdena Crandall        Ref. Address:      [address]                    [address]                                                              R[address]  Attending:        Lin Landsman      Secondary Phy.:    3rd Nursing- HR                    MD                                                              OB                                                              3rd Floor  Referred By:      Conan Bowens          Location:          [facility]                    MD ---------------------------------------------------------------------- Orders   #  Description                          Code         Ordered By   1  [place] MFM OB FOLLOW UP                  40981.19        ----------------------------------------------------------------------   #  Order #                    Accession #                 Episode #   1  [id number]                  [id number]  [id number]  ---------------------------------------------------------------------- Indications   Premature rupture of membranes - leaking       O42.90   fluid   Hypertension - Chronic/Pre-existing (no        O10.019   meds)   Pre-existing diabetes, type 2, in pregnancy,   O24.112   second trimester (metformin, insulin, not   currently taking either)   [redacted] weeks gestation of pregnancy                Z3A.25  ---------------------------------------------------------------------- Fetal Evaluation  Num Of Fetuses:          1  Fetal Heart Rate(bpm):   131  Cardiac Activity:        Observed  Presentation:            Cephalic  Placenta:                Right lateral  Amniotic Fluid  AFI FV:      Oligohydramnios                              Largest Pocket(cm)                              3.08 ---------------------------------------------------------------------- Biometry  BPD:      64.2  mm     G. Age:  26w 0d         49  %    CI:        70.73   %    70 - 86                                                          FL/HC:       18.6  %    18.6 - 20.4  HC:       243.3  mm     G. Age:  26w 3d         51  %    HC/AC:       1.10       1.04 - 1.22  AC:      220.6  mm     G. Age:  26w 3d         66  %    FL/BPD:      70.4  %    71 - 87  FL:       45.2  mm     G. Age:  25w 0d         17  %    FL/AC:       20.5  %    20 - 24  HUM:      41.6  mm     G. Age:  25w 1d         29  %  LV:        7.6  mm  Est. FW:     868   gm   1 lb 15 oz      58  % ---------------------------------------------------------------------- OB History  Gravidity:    3         Term:   2        Prem:  0        SAB:   0  TOP:          0       Ectopic:  0        Living: 2 ---------------------------------------------------------------------- Gestational Age  LMP:           28w 2d        Date:  10/07/17                 EDD:   07/14/18  U/S Today:     26w 0d                                        EDD:   07/30/18  Best:          25w 5d     Det. ByMarcella Dubs         EDD:   08/01/18                                      (12/22/17) ---------------------------------------------------------------------- Anatomy  Ventricles:            Appears normal         Stomach:                Appears normal, left                                                                        sided  Cerebellum:            Appears normal         Kidneys:                Appear normal  Posterior Fossa:       Appears normal         Bladder:                Appears normal ---------------------------------------------------------------------- Cervix Uterus Adnexa  Cervix  Length:           3.84  cm.  Normal appearance by transabdominal scan. ---------------------------------------------------------------------- Impression  PPROM  Inpatient  MVP 3 however subjectively low fluid. ---------------------------------------------------------------------- Recommendations  Follow up per inpatient team. ----------------------------------------------------------------------               Lin Landsman, MD Electronically Signed Final Report    04/23/2018 10:17 am ----------------------------------------------------------------------   Current scheduled medications . aspirin EC  81 mg Oral Daily  . docusate sodium  100 mg Oral Daily  . Influenza vac split quadrivalent PF  0.5 mL Intramuscular Tomorrow-1000  . insulin aspart  0-16 Units Subcutaneous TID PC  . insulin aspart  7 Units Subcutaneous TID WC  . insulin glargine  17 Units Subcutaneous QAC breakfast  . insulin glargine  5 Units Subcutaneous QHS  . prenatal multivitamin  1 tablet Oral Q1200    I have reviewed the patient's current medications.  ASSESSMENT: Principal Problem:   Preterm premature rupture of membranes, unspecified as to length of time between rupture and onset of labor, second  trimester Active Problems:   Pre-existing type 2 diabetes mellitus during pregnancy, antepartum   PLAN: Patient reports some abdominal tenderness with palpation, however she does not react to palpation CBC this am S/p BTMZ 11/8-9 S/p NICU consult S/p MgSO4 S/p latency abx DM2: lantus 17 Q am, 5 Q pm, novolog TID with meals BP stable Regular diet per patient preference Continue routine antenatal care.   Continue routine antenatal care.   Baldemar LenisK. Meryl , M.D. Attending [facility] (Faculty Practice)  04/25/2018 6:14 AM

## 2018-04-26 LAB — TYPE AND SCREEN
ABO/RH(D): A POS
Antibody Screen: NEGATIVE

## 2018-04-26 LAB — GLUCOSE, CAPILLARY
Glucose-Capillary: 90 mg/dL (ref 70–99)
Glucose-Capillary: 108 mg/dL — ABNORMAL HIGH (ref 70–99)
Glucose-Capillary: 199 mg/dL — ABNORMAL HIGH (ref 70–99)

## 2018-04-26 NOTE — Progress Notes (Signed)
Pt off unit at the time her 2 hrs PP CBG was due at 1130

## 2018-04-26 NOTE — Progress Notes (Signed)
Pt refusing monitoring this evening. RN educated about importance of monitoring but pt declined.   Pt reports eating when off the unit but does not remember when. CBG checked anyway.  Arva ChafeMeghan E Seylah Wernert, RN

## 2018-04-26 NOTE — Progress Notes (Signed)
Came to round on patient and was told she left and was observed getting into an [employer] downstairs (common occurrence for patient to leave hospital).  Ongoing team to round to on her when she returns, will continue to emphasize need for inpatient surveillance given that she does have PPROM, and increased of risk of infection associated with leaving the hospital and going to different places.   Jaynie CollinsUGONNA  Terre Zabriskie, MD, FACOG Obstetrician & Gynecologist, [facility], [facility]

## 2018-04-26 NOTE — Progress Notes (Signed)
Up to round on patient, she is off unit currently.    K. Meryl Cindy Brindisi, M.D. AttenBaldem[facility] Lenisding [facility] Midwife(Faculty Practice)

## 2018-04-26 NOTE — Progress Notes (Signed)
FACULTY PRACTICE ANTEPARTUM PROGRESS NOTE  Kathryn Shelton is a 27 y.o. G3P2002 at 3568w1d who is admitted for PPROM.  Estimated Date of Delivery: 08/01/18 Fetal presentation is cephalic.  Length of Stay:  24 Days. Admitted 04/02/2018  Subjective:  Patient reports normal fetal movement.  She denies uterine contractions, denies bleeding and leaking of fluid per vagina.  Vitals:  Blood pressure 107/69, pulse (!) 106, temperature 98.5 F (36.9 C), temperature source Oral, resp. rate 18, height 5\' 6"  (1.676 m), weight 96.6 kg, last menstrual period 10/07/2017, SpO2 100 %. Physical Examination: CONSTITUTIONAL: Well-developed, well-nourished female in no acute distress.  HENT:  Normocephalic, atraumatic, External right and left ear normal. Oropharynx is clear and moist EYES: Conjunctivae and EOM are normal. Pupils are equal, round, and reactive to light. No scleral icterus.  NECK: Normal range of motion, supple, no masses. SKIN: Skin is warm and dry. No rash noted. Not diaphoretic. No erythema. No pallor. NEUROLGIC: Alert and oriented to person, place, and time. Normal reflexes, muscle tone coordination. No cranial nerve deficit noted. PSYCHIATRIC: Normal mood and affect. Normal behavior. Normal judgment and thought content. CARDIOVASCULAR: Normal heart rate noted, regular rhythm RESPIRATORY: Effort and breath sounds normal, no problems with respiration noted MUSCULOSKELETAL: Normal range of motion. No edema and no tenderness. ABDOMEN: Soft, nontender, nondistended, gravid. CERVIX: deferred  Fetal monitoring: has not had monitoring yet today  Results for orders placed or performed during the hospital encounter of 04/02/18 (from the past 48 hour(s))  Glucose, capillary     Status: Abnormal   Collection Time: 04/24/18  8:08 PM  Result Value Ref Range   Glucose-Capillary 103 (H) 70 - 99 mg/dL   Comment 1 Notify RN   Glucose, capillary     Status: None   Collection Time: 04/25/18  3:07 AM   Result Value Ref Range   Glucose-Capillary 80 70 - 99 mg/dL   Comment 1 Notify RN   CBC with Differential/Platelet     Status: Abnormal   Collection Time: 04/25/18  7:58 AM  Result Value Ref Range   WBC 4.4 4.0 - 10.5 K/uL   RBC 3.79 (L) 3.87 - 5.11 MIL/uL   Hemoglobin 10.5 (L) 12.0 - 15.0 g/dL   HCT 16.131.7 (L) 09.636.0 - 04.546.0 %   MCV 83.6 80.0 - 100.0 fL   MCH 27.7 26.0 - 34.0 pg   MCHC 33.1 30.0 - 36.0 g/dL   RDW 40.913.5 81.111.5 - 91.415.5 %   Platelets 242 150 - 400 K/uL   nRBC 0.0 0.0 - 0.2 %   Neutrophils Relative % 63 %   Neutro Abs 2.8 1.7 - 7.7 K/uL   Lymphocytes Relative 28 %   Lymphs Abs 1.2 0.7 - 4.0 K/uL   Monocytes Relative 4 %   Monocytes Absolute 0.2 0.1 - 1.0 K/uL   Eosinophils Relative 5 %   Eosinophils Absolute 0.2 0.0 - 0.5 K/uL   Basophils Relative 0 %   Basophils Absolute 0.0 0.0 - 0.1 K/uL    Comment: Performed at [facility], [address] [place], [place] [address]  Glucose, capillary     Status: None   Collection Time: 04/25/18  8:00 AM  Result Value Ref Range   Glucose-Capillary 70 70 - 99 mg/dL  Glucose, capillary     Status: Abnormal   Collection Time: 04/25/18 11:36 AM  Result Value Ref Range   Glucose-Capillary 114 (H) 70 - 99 mg/dL  Glucose, capillary     Status: Abnormal   Collection  Time: 04/25/18  4:09 PM  Result Value Ref Range   Glucose-Capillary 100 (H) 70 - 99 mg/dL  Glucose, capillary     Status: Abnormal   Collection Time: 04/25/18  8:22 PM  Result Value Ref Range   Glucose-Capillary 146 (H) 70 - 99 mg/dL  Glucose, capillary     Status: None   Collection Time: 04/26/18  3:09 AM  Result Value Ref Range   Glucose-Capillary 90 70 - 99 mg/dL  Type and screen [facility] [place]     Status: None   Collection Time: 04/26/18  6:13 AM  Result Value Ref Range   ABO/RH(D) A POS    Antibody Screen NEG    Sample Expiration      04/29/2018 Performed at [facility], [address] [place], [place] [address]   Glucose,  capillary     Status: Abnormal   Collection Time: 04/26/18  3:03 PM  Result Value Ref Range   Glucose-Capillary 108 (H) 70 - 99 mg/dL    No results found.  Current scheduled medications . aspirin EC  81 mg Oral Daily  . docusate sodium  100 mg Oral Daily  . Influenza vac split quadrivalent PF  0.5 mL Intramuscular Tomorrow-1000  . insulin aspart  0-16 Units Subcutaneous TID PC  . insulin aspart  7 Units Subcutaneous TID WC  . insulin glargine  17 Units Subcutaneous QAC breakfast  . insulin glargine  5 Units Subcutaneous QHS  . prenatal multivitamin  1 tablet Oral Q1200    I have reviewed the patient's current medications.  ASSESSMENT: Principal Problem:   Preterm premature rupture of membranes, unspecified as to length of time between rupture and onset of labor, second trimester Active Problems:   Pre-existing type 2 diabetes mellitus during pregnancy, antepartum   PLAN: Patient has been off unit all day and presumed to have left hospital grounds. Reviewed with patient that she is not supposed to leave hospital, that leaving constitutes significant risk to herself and her baby. She verbalizes understanding of the above.   S/p BTMZ 11/8-9 S/p NICU consult S/p MgSO4 S/p latency abx DM2: lantus 17 Q am, 5 Q pm, novolog 7 TID with meals BP stable Regular diet per patient preference Continue routine antenatal care.    Baldemar Lenis, M.D. Attending [facility] [employer] (Faculty Practice)  04/26/2018 3:48 PM

## 2018-04-27 LAB — GLUCOSE, CAPILLARY
Glucose-Capillary: 67 mg/dL — ABNORMAL LOW (ref 70–99)
Glucose-Capillary: 68 mg/dL — ABNORMAL LOW (ref 70–99)
Glucose-Capillary: 95 mg/dL (ref 70–99)
Glucose-Capillary: 73 mg/dL (ref 70–99)
Glucose-Capillary: 133 mg/dL — ABNORMAL HIGH (ref 70–99)
Glucose-Capillary: 108 mg/dL — ABNORMAL HIGH (ref 70–99)
Glucose-Capillary: 154 mg/dL — ABNORMAL HIGH (ref 70–99)
Glucose-Capillary: 84 mg/dL (ref 70–99)

## 2018-04-27 NOTE — Progress Notes (Signed)
Hypoglycemic Event  CBG: 67 (at 0328)  Treatment: Cheree DittoGraham crackers (at 0350, needed to get some from another unit)  Symptoms: None  Follow-up CBG:  Time: 0406  CBG Result: 95  Possible Reasons for Event: Insulin  Comments/MD notified: Continued with hypoglycemia protocol, patient stable, Dr. Vergie LivingPickens notified.     Arva ChafeMeghan E Roselina Burgueno, RN

## 2018-04-27 NOTE — Progress Notes (Signed)
Pt states she is going to sleep not ready to be on monitor  Also refused at 0800 states she will call me when she wakes  No c/o pain or discomfort

## 2018-04-27 NOTE — Progress Notes (Signed)
FACULTY PRACTICE ANTEPARTUM PROGRESS NOTE  Kathryn SerOctavia Shelton is a 27 y.o. G3P2002 at 1754w2d who is admitted for PPROM.  Estimated Date of Delivery: 08/01/18 Fetal presentation is cephalic.  Length of Stay:  25 Days. Admitted 04/02/2018  Subjective:  Patient reports normal fetal movement.  She denies uterine contractions, denies bleeding and leaking of fluid per vagina.  Vitals:  Blood pressure 90/63, pulse 91, temperature 98.6 F (37 C), temperature source Oral, resp. rate 18, height 5\' 6"  (1.676 m), weight 96.6 kg, last menstrual period 10/07/2017, SpO2 100 %. Physical Examination: CONSTITUTIONAL: Well-developed, well-nourished female in no acute distress.  HENT:  Normocephalic, atraumatic, External right and left ear normal. Oropharynx is clear and moist EYES: Conjunctivae and EOM are normal. Pupils are equal, round, and reactive to light. No scleral icterus.  NECK: Normal range of motion, supple, no masses. SKIN: Skin is warm and dry. No rash noted. Not diaphoretic. No erythema. No pallor. NEUROLGIC: Alert and oriented to person, place, and time. Normal reflexes, muscle tone coordination. No cranial nerve deficit noted. PSYCHIATRIC: Normal mood and affect. Normal behavior. Normal judgment and thought content. CARDIOVASCULAR: Normal heart rate noted, regular rhythm RESPIRATORY: Effort and breath sounds normal, no problems with respiration noted MUSCULOSKELETAL: Normal range of motion. No edema and no tenderness. ABDOMEN: Soft, nontender, nondistended, gravid. CERVIX: deferred  Fetal monitoring: FHR: 150 bpm, Variability: moderate, Accelerations: Present, Decelerations: Absent  Uterine activity: no contractions per hour  Results for orders placed or performed during the hospital encounter of 04/02/18 (from the past 48 hour(s))  Glucose, capillary     Status: Abnormal   Collection Time: 04/25/18  4:09 PM  Result Value Ref Range   Glucose-Capillary 100 (H) 70 - 99 mg/dL  Glucose, capillary      Status: Abnormal   Collection Time: 04/25/18  8:22 PM  Result Value Ref Range   Glucose-Capillary 146 (H) 70 - 99 mg/dL  Glucose, capillary     Status: None   Collection Time: 04/26/18  3:09 AM  Result Value Ref Range   Glucose-Capillary 90 70 - 99 mg/dL  Type and screen [facility] [place]     Status: None   Collection Time: 04/26/18  6:13 AM  Result Value Ref Range   ABO/RH(D) A POS    Antibody Screen NEG    Sample Expiration      04/29/2018 Performed at [facility], [address] [place], [place] [address]   Glucose, capillary     Status: Abnormal   Collection Time: 04/26/18  3:03 PM  Result Value Ref Range   Glucose-Capillary 108 (H) 70 - 99 mg/dL  Glucose, capillary     Status: Abnormal   Collection Time: 04/26/18 10:23 PM  Result Value Ref Range   Glucose-Capillary 199 (H) 70 - 99 mg/dL  Glucose, capillary     Status: Abnormal   Collection Time: 04/27/18  3:11 AM  Result Value Ref Range   Glucose-Capillary 68 (L) 70 - 99 mg/dL  Glucose, capillary     Status: Abnormal   Collection Time: 04/27/18  3:28 AM  Result Value Ref Range   Glucose-Capillary 67 (L) 70 - 99 mg/dL  Glucose, capillary     Status: None   Collection Time: 04/27/18  4:06 AM  Result Value Ref Range   Glucose-Capillary 95 70 - 99 mg/dL  Glucose, capillary     Status: None   Collection Time: 04/27/18  8:02 AM  Result Value Ref Range   Glucose-Capillary 73 70 - 99  mg/dL  Glucose, capillary     Status: Abnormal   Collection Time: 04/27/18 10:02 AM  Result Value Ref Range   Glucose-Capillary 133 (H) 70 - 99 mg/dL    No results found.  Current scheduled medications . aspirin EC  81 mg Oral Daily  . docusate sodium  100 mg Oral Daily  . Influenza vac split quadrivalent PF  0.5 mL Intramuscular Tomorrow-1000  . insulin aspart  0-16 Units Subcutaneous TID PC  . insulin aspart  7 Units Subcutaneous TID WC  . insulin glargine  17 Units Subcutaneous QAC breakfast  . insulin  glargine  5 Units Subcutaneous QHS  . prenatal multivitamin  1 tablet Oral Q1200    I have reviewed the patient's current medications.  ASSESSMENT: Principal Problem:   Preterm premature rupture of membranes, unspecified as to length of time between rupture and onset of labor, second trimester Active Problems:   Supervision of high risk pregnancy, antepartum   Chronic hypertension during pregnancy, antepartum   Pre-existing type 2 diabetes mellitus during pregnancy, antepartum   Oligohydramnios antepartum, second trimester, other fetus   PLAN: Labile BS Cont current regimen S/p BTMZ 11/8-9 S/p NICU consult S/p MgSO4 S/p latency abx DM2: lantus 17 Q am, 5 Q pm, novolog 7 TID with meals SSI BP stable Regular diet per patient preference Continue routine antenatal care.   Baldemar Lenis, M.D. Attending [facility] [employer] (Faculty Practice)  04/27/2018 11:47 AM

## 2018-04-27 NOTE — Progress Notes (Signed)
Hypoglycemic Event  CBG: 68 (at 0311)  Treatment: Regular ginger ale   Symptoms: None  Follow-up CBG:  Time: 0328  CBG Result: 67  Possible Reasons for Event: Insulin  Comments/MD notified: Followed hypoglycemia protocol, patient refused juice, RN watched her drink the regular soda, CBG still low. Will treat with graham crackers, recheck, and notify physician.    Arva ChafeMeghan E Chayse Gracey, RN

## 2018-04-28 ENCOUNTER — Inpatient Hospital Stay (HOSPITAL_COMMUNITY): Payer: Medicaid Other

## 2018-04-28 ENCOUNTER — Inpatient Hospital Stay (HOSPITAL_BASED_OUTPATIENT_CLINIC_OR_DEPARTMENT_OTHER): Payer: Medicaid Other

## 2018-04-28 DIAGNOSIS — O24112 Pre-existing diabetes mellitus, type 2, in pregnancy, second trimester: Secondary | ICD-10-CM

## 2018-04-28 DIAGNOSIS — Z3A26 26 weeks gestation of pregnancy: Secondary | ICD-10-CM

## 2018-04-28 DIAGNOSIS — O10013 Pre-existing essential hypertension complicating pregnancy, third trimester: Secondary | ICD-10-CM

## 2018-04-28 DIAGNOSIS — O4290 Premature rupture of membranes, unspecified as to length of time between rupture and onset of labor, unspecified weeks of gestation: Secondary | ICD-10-CM

## 2018-04-28 LAB — GLUCOSE, CAPILLARY
GLUCOSE-CAPILLARY: 37 mg/dL — AB (ref 70–99)
Glucose-Capillary: 99 mg/dL (ref 70–99)
Glucose-Capillary: 70 mg/dL (ref 70–99)
Glucose-Capillary: 125 mg/dL — ABNORMAL HIGH (ref 70–99)
Glucose-Capillary: 56 mg/dL — ABNORMAL LOW (ref 70–99)
Glucose-Capillary: 126 mg/dL — ABNORMAL HIGH (ref 70–99)
Glucose-Capillary: 215 mg/dL — ABNORMAL HIGH (ref 70–99)

## 2018-04-28 NOTE — Progress Notes (Signed)
FACULTY PRACTICE ANTEPARTUM PROGRESS NOTE  Kathryn Shelton is a 27 y.o. G3P2002 at [redacted]w[redacted]d who is admitted for PPROM.  Estimated Date of Delivery: 08/01/18 Fetal presentation is cephalic.  Length of Stay:  26 Days. Admitted 04/02/2018  Subjective:  Patient reports normal fetal movement.  She denies uterine contractions, denies bleeding and leaking of fluid per vagina.  Vitals:  Blood pressure 116/80, pulse 99, temperature 98.7 F (37.1 C), temperature source Oral, resp. rate 18, height 5\' 6"  (1.676 m), weight 96.6 kg, last menstrual period 10/07/2017, SpO2 100 %. Physical Examination: CONSTITUTIONAL: Well-developed, well-nourished female in no acute distress.  HENT:  Normocephalic, atraumatic, External right and left ear normal. Oropharynx is clear and moist EYES: Conjunctivae and EOM are normal. Pupils are equal, round, and reactive to light. No scleral icterus.  NECK: Normal range of motion, supple, no masses. SKIN: Skin is warm and dry. No rash noted. Not diaphoretic. No erythema. No pallor. NEUROLGIC: Alert and oriented to person, place, and time. Normal reflexes, muscle tone coordination. No cranial nerve deficit noted. PSYCHIATRIC: Normal mood and affect. Normal behavior. Normal judgment and thought content. CARDIOVASCULAR: Normal heart rate noted, regular rhythm RESPIRATORY: Effort and breath sounds normal, no problems with respiration noted MUSCULOSKELETAL: Normal range of motion. No edema and no tenderness. ABDOMEN: Soft, nontender, nondistended, gravid. CERVIX: deferred  Fetal monitoring: refused monitoring today    Results for orders placed or performed during the hospital encounter of 04/02/18 (from the past 48 hour(s))  Glucose, capillary     Status: Abnormal   Collection Time: 04/26/18  3:03 PM  Result Value Ref Range   Glucose-Capillary 108 (H) 70 - 99 mg/dL  Glucose, capillary     Status: Abnormal   Collection Time: 04/26/18 10:23 PM  Result Value Ref Range   Glucose-Capillary 199 (H) 70 - 99 mg/dL  Glucose, capillary     Status: Abnormal   Collection Time: 04/27/18  3:11 AM  Result Value Ref Range   Glucose-Capillary 68 (L) 70 - 99 mg/dL  Glucose, capillary     Status: Abnormal   Collection Time: 04/27/18  3:28 AM  Result Value Ref Range   Glucose-Capillary 67 (L) 70 - 99 mg/dL  Glucose, capillary     Status: None   Collection Time: 04/27/18  4:06 AM  Result Value Ref Range   Glucose-Capillary 95 70 - 99 mg/dL  Glucose, capillary     Status: None   Collection Time: 04/27/18  8:02 AM  Result Value Ref Range   Glucose-Capillary 73 70 - 99 mg/dL  Glucose, capillary     Status: Abnormal   Collection Time: 04/27/18 10:02 AM  Result Value Ref Range   Glucose-Capillary 133 (H) 70 - 99 mg/dL  Glucose, capillary     Status: Abnormal   Collection Time: 04/27/18  2:04 PM  Result Value Ref Range   Glucose-Capillary 108 (H) 70 - 99 mg/dL  Glucose, capillary     Status: Abnormal   Collection Time: 04/27/18  6:52 PM  Result Value Ref Range   Glucose-Capillary 154 (H) 70 - 99 mg/dL  Glucose, capillary     Status: None   Collection Time: 04/27/18 10:37 PM  Result Value Ref Range   Glucose-Capillary 84 70 - 99 mg/dL  Glucose, capillary     Status: None   Collection Time: 04/28/18  2:20 AM  Result Value Ref Range   Glucose-Capillary 99 70 - 99 mg/dL  Glucose, capillary     Status: None   Collection Time:  04/28/18  9:00 AM  Result Value Ref Range   Glucose-Capillary 70 70 - 99 mg/dL    No results found.  Current scheduled medications . aspirin EC  81 mg Oral Daily  . docusate sodium  100 mg Oral Daily  . Influenza vac split quadrivalent PF  0.5 mL Intramuscular Tomorrow-1000  . insulin aspart  0-16 Units Subcutaneous TID PC  . insulin aspart  7 Units Subcutaneous TID WC  . insulin glargine  17 Units Subcutaneous QAC breakfast  . insulin glargine  5 Units Subcutaneous QHS  . prenatal multivitamin  1 tablet Oral Q1200    I have reviewed  the patient's current medications.  ASSESSMENT: Principal Problem:   Preterm premature rupture of membranes, unspecified as to length of time between rupture and onset of labor, second trimester Active Problems:   Supervision of high risk pregnancy, antepartum   Chronic hypertension during pregnancy, antepartum   Pre-existing type 2 diabetes mellitus during pregnancy, antepartum   Oligohydramnios antepartum, second trimester, other fetus   PLAN: Patient refused monitoring today, had BPP 8/8 today Labile BS Cont current regimen S/p BTMZ 11/8-9 S/p NICU consult S/p MgSO4 S/p latency abx DM2: lantus 17 Q am, 5 Q pm, novolog7TID with meals SSI BP stable Regular diet per patient preference   Patient has been noted to leave unit and staff have been unable to find her, per reports she has been seen in parking lot getting into cars. I reviewed with patient that as long as she is admitted to hospital for PPROM, she is not allowed to leave unit, reviewed significant risks of herself and to baby and that if she leaves unit, no interventions can be made for herself or baby. She is understandably frustrated about not being able to go sit in the parking lot to "get fresh air", states she has only gone to parking lot and not left premises. I reviewed with her that we cannot intervene if necessary if she is in parking lot or elsewhere in hospital and that she must remain on unit. I reviewed there remain significant risks to her health and to baby's health. I reviewed that if she leaves unit and staff are unable to find her, she will be considered to have left AMA. She verbalizes understanding of the above, I answered all questions.    Continue routine antenatal care.   Baldemar LenisK. Meryl Davis, M.D. Attending Center for [facility] (Faculty Practice)  04/28/2018 9:36 AM

## 2018-04-28 NOTE — Progress Notes (Signed)
In to see patient, she is off the unit.  Baldemar LenisK. Meryl Ahmere Hemenway, M.D. Attending Center for [facility](Faculty Practice)

## 2018-04-28 NOTE — Progress Notes (Signed)
Pt   Did not allow me to place her on the monitor today

## 2018-04-29 LAB — CBC
HEMATOCRIT: 31.1 % — AB (ref 36.0–46.0)
Hemoglobin: 10.2 g/dL — ABNORMAL LOW (ref 12.0–15.0)
MCH: 27.6 pg (ref 26.0–34.0)
MCHC: 32.8 g/dL (ref 30.0–36.0)
MCV: 84.1 fL (ref 80.0–100.0)
Platelets: 271 10*3/uL (ref 150–400)
RBC: 3.7 MIL/uL — ABNORMAL LOW (ref 3.87–5.11)
RDW: 13.5 % (ref 11.5–15.5)
WBC: 4.7 10*3/uL (ref 4.0–10.5)
nRBC: 0 % (ref 0.0–0.2)

## 2018-04-29 LAB — GLUCOSE, CAPILLARY
GLUCOSE-CAPILLARY: 66 mg/dL — AB (ref 70–99)
Glucose-Capillary: 70 mg/dL (ref 70–99)
Glucose-Capillary: 102 mg/dL — ABNORMAL HIGH (ref 70–99)
Glucose-Capillary: 184 mg/dL — ABNORMAL HIGH (ref 70–99)
Glucose-Capillary: 69 mg/dL — ABNORMAL LOW (ref 70–99)
Glucose-Capillary: 76 mg/dL (ref 70–99)
Glucose-Capillary: 195 mg/dL — ABNORMAL HIGH (ref 70–99)

## 2018-04-29 LAB — TYPE AND SCREEN
ABO/RH(D): A POS
Antibody Screen: NEGATIVE

## 2018-04-29 LAB — RPR: RPR Ser Ql: NONREACTIVE

## 2018-04-29 LAB — HIV ANTIBODY (ROUTINE TESTING W REFLEX): HIV Screen 4th Generation wRfx: NONREACTIVE

## 2018-04-29 MED ORDER — INSULIN GLARGINE 100 UNIT/ML ~~LOC~~ SOLN
4.0000 [IU] | Freq: Every day | SUBCUTANEOUS | Status: DC
  Administered 2018-04-29 – 2018-05-01 (×3): 4 [IU] via SUBCUTANEOUS
  Filled 2018-04-29 (×3): qty 0.04

## 2018-04-29 NOTE — Progress Notes (Signed)
Pt declined fetal monitoring this AM. Pt will call RN when she is ready to be placed on the monitor.

## 2018-04-29 NOTE — Progress Notes (Signed)
Mrs. Kathryn Shelton left unit for vending machine @17 :22 and told RN where she was going. She returned @1727 .

## 2018-04-29 NOTE — Progress Notes (Signed)
FACULTY PRACTICE ANTEPARTUM PROGRESS NOTE  Kathryn Shelton is a 27 y.o. G3P2002 at [redacted]w[redacted]d who is admitted for PPROM.  Estimated Date of Delivery: 08/01/18 Fetal presentation is cephalic.  Length of Stay:  27 Days. Admitted 04/02/2018  Subjective: Patient reports normal fetal movement.  She denies uterine contractions, denies bleeding and leaking of fluid per vagina.  Vitals:  Blood pressure 109/67, pulse 97, temperature 97.6 F (36.4 C), temperature source Oral, resp. rate 18, height 5\' 6"  (1.676 m), weight 96.6 kg, last menstrual period 10/07/2017, SpO2 100 %. Physical Examination: CONSTITUTIONAL: Well-developed, well-nourished female in no acute distress.  HENT:  Normocephalic, atraumatic, External right and left ear normal. Oropharynx is clear and moist EYES: Conjunctivae and EOM are normal. Pupils are equal, round, and reactive to light. No scleral icterus.  NECK: Normal range of motion, supple, no masses. SKIN: Skin is warm and dry. No rash noted. Not diaphoretic. No erythema. No pallor. NEUROLGIC: Alert and oriented to person, place, and time. Normal reflexes, muscle tone coordination. No cranial nerve deficit noted. PSYCHIATRIC: Normal mood and affect. Normal behavior. Normal judgment and thought content. CARDIOVASCULAR: Normal heart rate noted, regular rhythm RESPIRATORY: Effort and breath sounds normal, no problems with respiration noted MUSCULOSKELETAL: Normal range of motion. No edema and no tenderness. ABDOMEN: Soft, nontender, nondistended, gravid. CERVIX: deferred  Fetal monitoring: has not had monitoring yet today  Results for orders placed or performed during the hospital encounter of 04/02/18 (from the past 48 hour(s))  Glucose, capillary     Status: Abnormal   Collection Time: 04/27/18 10:02 AM  Result Value Ref Range   Glucose-Capillary 133 (H) 70 - 99 mg/dL  Glucose, capillary     Status: Abnormal   Collection Time: 04/27/18  2:04 PM  Result Value Ref Range    Glucose-Capillary 108 (H) 70 - 99 mg/dL  Glucose, capillary     Status: Abnormal   Collection Time: 04/27/18  6:52 PM  Result Value Ref Range   Glucose-Capillary 154 (H) 70 - 99 mg/dL  Glucose, capillary     Status: None   Collection Time: 04/27/18 10:37 PM  Result Value Ref Range   Glucose-Capillary 84 70 - 99 mg/dL  Glucose, capillary     Status: None   Collection Time: 04/28/18  2:20 AM  Result Value Ref Range   Glucose-Capillary 99 70 - 99 mg/dL  Glucose, capillary     Status: None   Collection Time: 04/28/18  9:00 AM  Result Value Ref Range   Glucose-Capillary 70 70 - 99 mg/dL  Glucose, capillary     Status: Abnormal   Collection Time: 04/28/18  1:12 PM  Result Value Ref Range   Glucose-Capillary 125 (H) 70 - 99 mg/dL  Glucose, capillary     Status: Abnormal   Collection Time: 04/28/18  4:39 PM  Result Value Ref Range   Glucose-Capillary 37 (LL) 70 - 99 mg/dL   Comment 1 Notify RN    Comment 2 Document in Chart   Glucose, capillary     Status: Abnormal   Collection Time: 04/28/18  5:01 PM  Result Value Ref Range   Glucose-Capillary 56 (L) 70 - 99 mg/dL   Comment 1 Notify RN    Comment 2 Document in Chart   Glucose, capillary     Status: Abnormal   Collection Time: 04/28/18  5:37 PM  Result Value Ref Range   Glucose-Capillary 126 (H) 70 - 99 mg/dL  Glucose, capillary     Status: Abnormal  Collection Time: 04/28/18  7:16 PM  Result Value Ref Range   Glucose-Capillary 215 (H) 70 - 99 mg/dL  Glucose, capillary     Status: None   Collection Time: 04/29/18  3:06 AM  Result Value Ref Range   Glucose-Capillary 70 70 - 99 mg/dL  Type and screen [facility]     Status: None   Collection Time: 04/29/18  5:45 AM  Result Value Ref Range   ABO/RH(D) A POS    Antibody Screen NEG    Sample Expiration      05/02/2018 Performed at [facility], [address] [place], [place] [address]   CBC     Status: Abnormal   Collection Time: 04/29/18  5:45 AM   Result Value Ref Range   WBC 4.7 4.0 - 10.5 K/uL   RBC 3.70 (L) 3.87 - 5.11 MIL/uL   Hemoglobin 10.2 (L) 12.0 - 15.0 g/dL   HCT 02.7 (L) 25.3 - 66.4 %   MCV 84.1 80.0 - 100.0 fL   MCH 27.6 26.0 - 34.0 pg   MCHC 32.8 30.0 - 36.0 g/dL   RDW 40.3 47.4 - 25.9 %   Platelets 271 150 - 400 K/uL   nRBC 0.0 0.0 - 0.2 %    Comment: Performed at [facility], [address], [place], [place] [address]     Current scheduled medications . aspirin EC  81 mg Oral Daily  . docusate sodium  100 mg Oral Daily  . Influenza vac split quadrivalent PF  0.5 mL Intramuscular Tomorrow-1000  . insulin aspart  0-16 Units Subcutaneous TID PC  . insulin aspart  7 Units Subcutaneous TID WC  . insulin glargine  17 Units Subcutaneous QAC breakfast  . insulin glargine  5 Units Subcutaneous QHS  . prenatal multivitamin  1 tablet Oral Q1200    I have reviewed the patient's current medications.  ASSESSMENT: Principal Problem:   Preterm premature rupture of membranes, unspecified as to length of time between rupture and onset of labor, second trimester Active Problems:   Supervision of high risk pregnancy, antepartum   Chronic hypertension during pregnancy, antepartum   Pre-existing type 2 diabetes mellitus during pregnancy, antepartum   Oligohydramnios antepartum, second trimester, other fetus   PLAN: Labile BS Decreased evening lantus to 4 units QHS S/p BTMZ 11/8-9 S/p NICU consult S/p MgSO4 S/p latency abx DM2: lantus 17 Q am, 4 Q pm, novolog7TID with meals SSI BP stable Regular diet per patient preference   Continue routine antenatal care.   Had discussion with patient regarding her leaving the unit. Reviewed parameters for unit and being an inpatient. Reviewed that she will be allowed to leave unit once per shift for 30 min, that she must tell her RN where she is going and be back at the 30 min mark. She must remain on campus during these times. If she does not return for 30 min and  security cannot find her where she stated she would be to her RN, she will be considered to have left AMA. Reviewed that she may discharge herself at any time but it would be considered AMA. Reviewed these parameters are for her safety as well as the safety of her fetus. Also reviewed hospital policy that any visitors under 18 must have an adult chaperone present at all times and patient cannot be responsible for children under 18 while admitted.   Volney Presser, RN present for conversation.   Baldemar Lenis, M.D. Attending Center for  [facility] Midwife(Faculty Practice)  04/29/2018 8:29 AM

## 2018-04-29 NOTE — Progress Notes (Signed)
CSW consulted to assist patient with taking parenting classes.   CSW spoke with [facility]'s [facility] P[facility] Care Manager [facility](Kara McAvoy) who reported that she has referred/signed up patient to the [facility] for two different classes (Child Birthing Classes and Home Visiting Parenting Classes). Pregnancy Care manger reported that there is currently a waiting list and that patient will be contacted when a spot is available.  CSW followed up with patient who confirmed that she is aware of the two classes that she is signed up for [facility]. Patient reported that she is not interested in signing up for any additional classes at this time. CSW asked patient if she needed any additional resources, patient reported none.   CSW will continue to offer support while [facility] is [facility] and will complete a full assessment after delivery due to CPS history.  Celso SickleKimberly Yomara Toothman, LCSWA Clinical Social Worker [facility] Cell#: [phone]

## 2018-04-29 NOTE — Progress Notes (Addendum)
Inpatient Diabetes Program Recommendations  AACE/ADA: New Consensus Statement on Inpatient Glycemic Control (2015)  Target Ranges:  Prepandial:   less than 140 mg/dL      Peak postprandial:   less than 180 mg/dL (1-2 hours)      Critically ill patients:  140 - 180 mg/dL   Lab Results  Component Value Date   GLUCAP 70 04/29/2018   HGBA1C 8.2 (H) 01/14/2018    Review of Glycemic Control Results for Kathryn Shelton, [place] (MRN [id number]) as of 04/29/2018 09:00  Ref. Range 04/28/2018 17:37 04/28/2018 19:16 04/29/2018 03:06  Glucose-Capillary Latest Ref Range: 70 - 99 mg/dL 829126 (H) 562215 (H) 70   Diabetes history:Type 2 DM Outpatient Diabetes medications:Lantus 10 units QHS Current orders for Inpatient glycemic control:Lantus7units QHS, Lantus 17units QAM, Novolog7units TID, Novolog 0-16 units TID  Inpatient Diabetes Program Recommendations:   Noted severe hypoglycemic episode yesterday of 37 mg/dL. This was following administration of meal coverage and 2 hour post prandial correction together. This patient eats inconsistently and was off the unit for a period of time yesterday; unsure if this created confusion. However, both forms of insulin should not be given at the same time based on how the order is written (creates high risk for lows). In agreement with current orders. Will continue to follow.   Thanks, Lujean RaveLauren Meliana Canner, MSN, RNC-OB Diabetes Coordinator [phone] (8a-5p)

## 2018-04-29 NOTE — Progress Notes (Addendum)
Hypoglycemic Event  CBG: 69  Treatment: pt eating rice   Symptoms: none  Follow-up CBG: Time:1405 CBG Result:76  Possible Reasons for Event: pt eating inconsistently     Dr. Earlene Plateravis notified   Jettie BoozeField, Shaw[place]aitlin A

## 2018-04-29 NOTE — Progress Notes (Signed)
Hypoglycemic Event  CBG: 63  Treatment: Pt was eating breakfast at time of CBG; >15g of carbs  Symptoms: asymptomatic   Follow-up CBG: Time: 0950 CBG Result: 102  Possible Reasons for Event: unknown   MD notified: Dr. Moshe Salisburyavis     Jovani Colquhoun, Linus Ornaitlin A

## 2018-04-29 NOTE — Progress Notes (Signed)
  Dr Earlene Plateravis and I just meet with Ms Kathryn Shelton. There have been numerous [facility] that have [facility] and [facility] about her taking walks and then disappearing for long periods of time (unable to locate). I was also informed today that an unaccompanied minor has been left in her supervision at previous times. This was discussed also with Dr Earlene Plateravis (by RN's and myself) and Dr Erin FullingHarraway-Smith (Dr Earlene Plateravis and myself). A planned has been agreed upon by the Dr's and presented to the patient.    PLAN:  *An unaccompanied minor is not to stay with any inpatient. Ms Kathryn Shelton is not allowed to have the minor in her room without another supervising adult. The child is to stay in the room and not be allowed to run out into the hall or into the nurses station. Educated that this is a [facility] policy. *Ms Kathryn Shelton is allowed to go off the unit for 30min Q shift. She must inform her nurse to where she is going so that staff could be found at anytime and to tell her nurse when she is back on the unit. If she is not back by 30min or if staff cannot find her at location she stated,  this would be considered an AMA discharge.  *Ms Kathryn Shelton educated if she leaves the hospital campus at anytime while an inpatient,she has [facility] to [facility].   If she is discharged AMA for not returning or found off campus she was instructed that her belongings would be packed and held at the desk.    Ms Kathryn Shelton stated that she understood that she is not being held against her will and she is free to go if she chooses, but it would be a discharge against medical advice.  She also stated that she understood she could not have a minor in the room without a supervising adult. She agreed to informing her nurse when she leaves for 30 min and comes back, she understood that if she is not back with the time, goes off campus or can not be found she will be discharged AMA.

## 2018-04-29 NOTE — Progress Notes (Signed)
Refusing monitoring for this evening. Pt has a head cold and does not feel well. RN instructed pt to call out if she changes her mind about monitoring.  Arva ChafeMeghan E Nalah Macioce, RN

## 2018-04-30 ENCOUNTER — Inpatient Hospital Stay (HOSPITAL_BASED_OUTPATIENT_CLINIC_OR_DEPARTMENT_OTHER): Payer: Medicaid Other

## 2018-04-30 DIAGNOSIS — O4290 Premature rupture of membranes, unspecified as to length of time between rupture and onset of labor, unspecified weeks of gestation: Secondary | ICD-10-CM

## 2018-04-30 DIAGNOSIS — O24112 Pre-existing diabetes mellitus, type 2, in pregnancy, second trimester: Secondary | ICD-10-CM

## 2018-04-30 DIAGNOSIS — O10012 Pre-existing essential hypertension complicating pregnancy, second trimester: Secondary | ICD-10-CM

## 2018-04-30 DIAGNOSIS — Z3A26 26 weeks gestation of pregnancy: Secondary | ICD-10-CM

## 2018-04-30 LAB — GLUCOSE, CAPILLARY
Glucose-Capillary: 79 mg/dL (ref 70–99)
Glucose-Capillary: 86 mg/dL (ref 70–99)
Glucose-Capillary: 88 mg/dL (ref 70–99)
Glucose-Capillary: 147 mg/dL — ABNORMAL HIGH (ref 70–99)
Glucose-Capillary: 143 mg/dL — ABNORMAL HIGH (ref 70–99)

## 2018-04-30 NOTE — Progress Notes (Signed)
Pt refusing monitoring at this time, says she wants to sleep more.  Pt to inform RN if she changes her mind.

## 2018-04-30 NOTE — Progress Notes (Signed)
FACULTY PRACTICE ANTEPARTUM PROGRESS NOTE  Kathryn Shelton is a 27 y.o. G3P2002 at [redacted]w[redacted]d who is admitted for PPROM.  Estimated Date of Delivery: 08/01/18 Fetal presentation is cephalic.  Length of Stay:  28 Days. Admitted 04/02/2018  Subjective: Patient reports normal fetal movement.  She denies uterine contractions, denies bleeding and leaking of fluid per vagina.  Vitals:  Blood pressure 118/82, pulse 79, temperature 98.6 F (37 C), temperature source Oral, resp. rate 18, height 5\' 6"  (1.676 m), weight 96.6 kg, last menstrual period 10/07/2017, SpO2 100 %. Physical Examination: CONSTITUTIONAL: Well-developed, well-nourished female in no acute distress.  HENT:  Normocephalic, atraumatic, External right and left ear normal. Oropharynx is clear and moist EYES: Conjunctivae and EOM are normal. Pupils are equal, round, and reactive to light. No scleral icterus.  NECK: Normal range of motion, supple, no masses. SKIN: Skin is warm and dry. No rash noted. Not diaphoretic. No erythema. No pallor. NEUROLGIC: Alert and oriented to person, place, and time. Normal reflexes, muscle tone coordination. No cranial nerve deficit noted. PSYCHIATRIC: Normal mood and affect. Normal behavior. Normal judgment and thought content. CARDIOVASCULAR: Normal heart rate noted, regular rhythm RESPIRATORY: Effort and breath sounds normal, no problems with respiration noted MUSCULOSKELETAL: Normal range of motion. No edema and no tenderness. ABDOMEN: Soft, nontender, nondistended, gravid. CERVIX: deferred  Fetal monitoring: refused monitoring this am   Results for orders placed or performed during the hospital encounter of 04/02/18 (from the past 48 hour(s))  Glucose, capillary     Status: Abnormal   Collection Time: 04/28/18  4:39 PM  Result Value Ref Range   Glucose-Capillary 37 (LL) 70 - 99 mg/dL   Comment 1 Notify RN    Comment 2 Document in Chart   Glucose, capillary     Status: Abnormal   Collection Time:  04/28/18  5:01 PM  Result Value Ref Range   Glucose-Capillary 56 (L) 70 - 99 mg/dL   Comment 1 Notify RN    Comment 2 Document in Chart   Glucose, capillary     Status: Abnormal   Collection Time: 04/28/18  5:37 PM  Result Value Ref Range   Glucose-Capillary 126 (H) 70 - 99 mg/dL  Glucose, capillary     Status: Abnormal   Collection Time: 04/28/18  7:16 PM  Result Value Ref Range   Glucose-Capillary 215 (H) 70 - 99 mg/dL  Glucose, capillary     Status: None   Collection Time: 04/29/18  3:06 AM  Result Value Ref Range   Glucose-Capillary 70 70 - 99 mg/dL  Type and screen [facility] [place]     Status: None   Collection Time: 04/29/18  5:45 AM  Result Value Ref Range   ABO/RH(D) A POS    Antibody Screen NEG    Sample Expiration      05/02/2018 Performed at [facility], [address] [place], [place] [address]   RPR     Status: None   Collection Time: 04/29/18  5:45 AM  Result Value Ref Range   RPR Ser Ql Non Reactive Non Reactive    Comment: (NOTE) Performed At: [facility] [address] [place], [place] [id number] Jolene Schimke MD [id number]   HIV Antibody (routine testing w rflx)     Status: None   Collection Time: 04/29/18  5:45 AM  Result Value Ref Range   HIV Screen 4th Generation wRfx Non Reactive Non Reactive    Comment: (NOTE) Performed At: [facility] [address] [place],  [place] [id number] Jolene SchimkeNagendra Sanjai MD WU:[phone]   CBC     Status: Abnormal   Collection Time: 04/29/18  5:45 AM  Result Value Ref Range   WBC 4.7 4.0 - 10.5 K/uL   RBC 3.70 (L) 3.87 - 5.11 MIL/uL   Hemoglobin 10.2 (L) 12.0 - 15.0 g/dL   HCT 95.631.1 (L) 21.336.0 - 08.646.0 %   MCV 84.1 80.0 - 100.0 fL   MCH 27.6 26.0 - 34.0 pg   MCHC 32.8 30.0 - 36.0 g/dL   RDW 57.813.5 46.911.5 - 62.915.5 %   Platelets 271 150 - 400 K/uL   nRBC 0.0 0.0 - 0.2 %    Comment: Performed at [facility], [address] [place], [place] [id number]  Glucose, capillary     Status:  Abnormal   Collection Time: 04/29/18  9:21 AM  Result Value Ref Range   Glucose-Capillary 66 (L) 70 - 99 mg/dL  Glucose, capillary     Status: Abnormal   Collection Time: 04/29/18  9:54 AM  Result Value Ref Range   Glucose-Capillary 102 (H) 70 - 99 mg/dL  Glucose, capillary     Status: Abnormal   Collection Time: 04/29/18 11:22 AM  Result Value Ref Range   Glucose-Capillary 184 (H) 70 - 99 mg/dL  Glucose, capillary     Status: Abnormal   Collection Time: 04/29/18  1:46 PM  Result Value Ref Range   Glucose-Capillary 69 (L) 70 - 99 mg/dL  Glucose, capillary     Status: None   Collection Time: 04/29/18  2:05 PM  Result Value Ref Range   Glucose-Capillary 76 70 - 99 mg/dL  Glucose, capillary     Status: Abnormal   Collection Time: 04/29/18  8:31 PM  Result Value Ref Range   Glucose-Capillary 195 (H) 70 - 99 mg/dL  Glucose, capillary     Status: None   Collection Time: 04/30/18  2:58 AM  Result Value Ref Range   Glucose-Capillary 79 70 - 99 mg/dL  Glucose, capillary     Status: None   Collection Time: 04/30/18  8:09 AM  Result Value Ref Range   Glucose-Capillary 86 70 - 99 mg/dL  Glucose, capillary     Status: None   Collection Time: 04/30/18 10:41 AM  Result Value Ref Range   Glucose-Capillary 88 70 - 99 mg/dL     Current scheduled medications . aspirin EC  81 mg Oral Daily  . docusate sodium  100 mg Oral Daily  . Influenza vac split quadrivalent PF  0.5 mL Intramuscular Tomorrow-1000  . insulin aspart  0-16 Units Subcutaneous TID PC  . insulin aspart  7 Units Subcutaneous TID WC  . insulin glargine  17 Units Subcutaneous QAC breakfast  . insulin glargine  4 Units Subcutaneous QHS  . prenatal multivitamin  1 tablet Oral Q1200    I have reviewed the patient's current medications.  ASSESSMENT: Principal Problem:   Preterm premature rupture of membranes, unspecified as to length of time between rupture and onset of labor, second trimester Active Problems:    Supervision of high risk pregnancy, antepartum   Chronic hypertension during pregnancy, antepartum   Pre-existing type 2 diabetes mellitus during pregnancy, antepartum   Oligohydramnios antepartum, second trimester, other fetus   PLAN: Labile BS Decreased evening lantus to 4 units QHS S/p BTMZ 11/8-9 S/p NICU consult S/p MgSO4 S/p latency abx DM2: lantus 17 Q am, 4 Q pm, novolog7TID with meals SSI BP stable Regular diet per patient preference   Continue  routine antenatal care.   Baldemar Lenis, M.D. Attending [facility] [employer] (Faculty Practice)  04/30/2018 1:15 PM

## 2018-04-30 NOTE — Progress Notes (Signed)
PT refusing monitoring for 1500-1900. Pt allowed monitoring at 1330.

## 2018-05-01 LAB — GLUCOSE, CAPILLARY
Glucose-Capillary: 73 mg/dL (ref 70–99)
Glucose-Capillary: 157 mg/dL — ABNORMAL HIGH (ref 70–99)
Glucose-Capillary: 103 mg/dL — ABNORMAL HIGH (ref 70–99)
Glucose-Capillary: 153 mg/dL — ABNORMAL HIGH (ref 70–99)

## 2018-05-01 MED ORDER — LORATADINE 10 MG PO TABS
10.0000 mg | ORAL_TABLET | Freq: Every day | ORAL | Status: DC
  Administered 2018-05-01 – 2018-05-18 (×17): 10 mg via ORAL
  Filled 2018-05-01 (×21): qty 1

## 2018-05-01 MED ORDER — SALINE SPRAY 0.65 % NA SOLN
1.0000 | NASAL | Status: DC | PRN
  Administered 2018-05-01 – 2018-05-02 (×2): 1 via NASAL
  Filled 2018-05-01: qty 44

## 2018-05-01 NOTE — Progress Notes (Signed)
[place]) NOTE  Kathryn Shelton is a 27 y.o. G3P2002 at 57w6dby  who is admitted for PPROM on 03/09/18 who opted for expectant management. Patient denies ctx, bleeding. Reports normal fetal movement. Reports intermittent leaking of only a light amount of fluid. She uses one pad per day.   Fetal presentation is cephalic. Patient Active Problem List   Diagnosis Date Noted  . Premature rupture of membranes 04/02/2018  . Oligohydramnios antepartum, second trimester, other fetus 03/09/2018  . Ruptured, membranes, premature 03/09/2018  . Obesity, morbid ([place] 03/09/2018  . Preterm premature rupture of membranes, unspecified as to length of time between rupture and onset of labor, second trimester 03/09/2018  . Nausea and vomiting during pregnancy prior to [redacted] weeks gestation 01/14/2018  . Bacterial vaginitis 01/02/2018  . Supervision of high risk pregnancy, antepartum 12/31/2017  . Chronic hypertension during pregnancy, antepartum 12/31/2017  . Pre-existing type 2 diabetes mellitus during pregnancy, antepartum 12/31/2017  . Mixed hyperlipidemia 12/02/2017  . Type II diabetes mellitus, uncontrolled ([place] 03/01/2017  . Essential hypertension 03/01/2017  . Intermittent palpitations 03/01/2017  . Panic attack 03/01/2017  . Obstructive sleep apnea syndrome 03/01/2017    Prenatal labs and studies: ABO, Rh: A/Positive/-- (10/03 1708) Antibody: Negative (10/03 1708) Rubella: 1.50 (10/03 1708) RPR: Non Reactive (10/03 1708)  HBsAg: Negative (10/03 1708)  HIV: Non Reactive (10/03 1708)  GBS:  2 hr GTT:  N/a Genetic screening normal Anatomy UKoreaanhydramnios  Medical History:      Past Medical History:  Diagnosis Date  . DM (diabetes mellitus), type 2 ([place]   . GERD (gastroesophageal reflux disease)   . Hypertension   . Sleep apnea     No past surgical history on file.                  OB History  Gravida Para Term Preterm AB Living  3 2 2   0 0 2  SAB TAB Ectopic Multiple Live Births     0 0 0 0 2       # Outcome Date GA Lbr Len/2nd Weight Sex Delivery Anes PTL Lv  3 Current           2 Term 12/11/11    F Vag-Spont  N LIV  1 Term 09/04/10    F Vag-Spont   LIV    Social History        Socioeconomic History  . Marital status: Legally Separated    Spouse name: Not on file  . Number of children: 2  . Years of education: [employer] . Highest education level: 12th grade  Occupational History  . Not on file  Social Needs  . Financial resource strain: Not very hard  . Food insecurity:    Worry: Never true    Inability: Never true  . Transportation needs:    Medical: No    Non-medical: No  Tobacco Use  . Smoking status: Never Smoker  . Smokeless tobacco: Never Used  Substance and Sexual Activity  . Alcohol use: No  . Drug use: No  . Sexual activity: Not Currently    Birth control/protection: None  Lifestyle  . Physical activity:    Days per week: Not on file    Minutes per session: Not on file  . Stress: Not at all  Relationships  . Social connections:    Talks on phone: More than three times a week    Gets together: Not on file    Attends  religious service: Never    Active member of club or organization: No    Attends meetings of clubs or organizations: Never    Relationship status: Separated  Other Topics Concern  . Not on file  Social History Narrative  . Not on file    Family History  Adopted: Yes  Family history unknown: Yes           Medications Prior to Admission  Medication Sig Dispense Refill Last Dose  . aspirin EC 81 MG tablet Take 1 tablet (81 mg total) by mouth daily. Take after 12 weeks for prevention of preeclampsia later in pregnancy 300 tablet 2 Taking  . Blood Glucose Monitoring Suppl (ACCU-CHEK GUIDE) w/Device KIT 1 Device by Does not apply route 4 (four) times daily. 1 kit 0 Taking  . glucose blood (ACCU-CHEK GUIDE) test  strip Use to check blood sugars four times a day was instructed 50 each 12 Taking  . Insulin Glargine (LANTUS) 100 UNIT/ML Solostar Pen Inject 10 Units into the skin daily at 10 pm. 15 mL 11 Taking  . Prenatal MV & Min w/FA-DHA (PRENATAL ADULT GUMMY/DHA/FA) 0.4-25 MG CHEW Chew 1 tablet by mouth daily. 30 tablet 12 Taking  . ranitidine (ZANTAC) 150 MG tablet Take 1 tablet (150 mg total) by mouth 2 (two) times daily. 60 tablet 2 Taking        Allergies  Allergen Reactions  . Shellfish-Derived Products Swelling    Prenatal Course Source of Care: [facility] with onset of care at 9 weeks Length of Stay:  29  Days  Subjective: AFI yesterday showed subjectively decreased but single pocket of 3.4 cm noted. Patient reports the fetal movement as active. Patient reports uterine contraction  activity as none. Patient reports  vaginal bleeding as none. Patient describes fluid per vagina as Other scanty clear/ white.  Vitals:  Blood pressure 110/73, pulse 97, temperature 98.2 F (36.8 C), temperature source Oral, resp. rate 18, height 5' 6"  (1.676 m), weight 96.6 kg, last menstrual period 10/07/2017, SpO2 100 %. Physical Examination:  General appearance - alert, well appearing, and in no distress, oriented to person, place, and time and overweight Heart - normal rate and regular rhythm Abdomen - soft, nontender, nondistended Fundal Height:  size equals dates Cervical Exam: Not evaluated. and found to be not evaluated/ / and fetal presentation is cephalic.by u/s yesterday Extremities: extremities normal, atraumatic, no cyanosis or edema and Homans sign is negative, no sign of DVT with DTRs 2+ bilaterally Membranes:intact, ruptured  Fetal Monitoring:  Baseline: 135 bpm, Variability: Good {> 6 bpm), Accelerations: Reactive and Decelerations: Absent  Labs:  Results for orders placed or performed during the hospital encounter of 04/02/18 (from the past 24 hour(s))  Glucose, capillary   Collection  Time: 04/30/18  2:34 PM  Result Value Ref Range   Glucose-Capillary 147 (H) 70 - 99 mg/dL  Glucose, capillary   Collection Time: 04/30/18  8:15 PM  Result Value Ref Range   Glucose-Capillary 143 (H) 70 - 99 mg/dL  Glucose, capillary   Collection Time: 05/01/18  3:24 AM  Result Value Ref Range   Glucose-Capillary 73 70 - 99 mg/dL  Glucose, capillary   Collection Time: 05/01/18 11:36 AM  Result Value Ref Range   Glucose-Capillary 157 (H) 70 - 99 mg/dL    Imaging Studies:     Medications:  Scheduled . aspirin EC  81 mg Oral Daily  . docusate sodium  100 mg Oral Daily  . Influenza vac split quadrivalent  PF  0.5 mL Intramuscular Tomorrow-1000  . insulin aspart  0-16 Units Subcutaneous TID PC  . insulin aspart  7 Units Subcutaneous TID WC  . insulin glargine  17 Units Subcutaneous QAC breakfast  . insulin glargine  4 Units Subcutaneous QHS  . prenatal multivitamin  1 tablet Oral Q1200   I have reviewed the patient's current medications.  ASSESSMENT: Patient Active Problem List   Diagnosis Date Noted  . Preterm premature rupture of membranes, unspecified as to length of time between rupture and onset of labor, second trimester 03/09/2018    Priority: Low  . Supervision of high risk pregnancy, antepartum 12/31/2017    Priority: Low  . Oligohydramnios antepartum, second trimester, other fetus 03/09/2018  . Obesity, morbid ([place]) 03/09/2018  . Chronic hypertension during pregnancy, antepartum 12/31/2017  . Pre-existing type 2 diabetes mellitus during pregnancy, antepartum 12/31/2017  . Mixed hyperlipidemia 12/02/2017  . Type II diabetes mellitus, uncontrolled ([place]) 03/01/2017  . Essential hypertension 03/01/2017  . Intermittent palpitations 03/01/2017  . Panic attack 03/01/2017  . Obstructive sleep apnea syndrome 03/01/2017    PLAN: inpt til delivery, AFI q Friday T&Screen updated q 3 d.  Jonnie Kind 05/01/2018,11:40 AM    Patient ID: Kinnie Feil, female   DOB:  1990-11-30, 27 y.o.   MRN: [id number]

## 2018-05-01 NOTE — Progress Notes (Signed)
Patient states she will let nurse know when she is ready to be monitored.

## 2018-05-02 LAB — TYPE AND SCREEN
ABO/RH(D): A POS
Antibody Screen: NEGATIVE

## 2018-05-02 LAB — CBC
HCT: 31.6 % — ABNORMAL LOW (ref 36.0–46.0)
Hemoglobin: 10.5 g/dL — ABNORMAL LOW (ref 12.0–15.0)
MCH: 28 pg (ref 26.0–34.0)
MCHC: 33.2 g/dL (ref 30.0–36.0)
MCV: 84.3 fL (ref 80.0–100.0)
NRBC: 0 % (ref 0.0–0.2)
Platelets: 302 10*3/uL (ref 150–400)
RBC: 3.75 MIL/uL — ABNORMAL LOW (ref 3.87–5.11)
RDW: 13.5 % (ref 11.5–15.5)
WBC: 6.3 10*3/uL (ref 4.0–10.5)

## 2018-05-02 LAB — GLUCOSE, CAPILLARY
Glucose-Capillary: 120 mg/dL — ABNORMAL HIGH (ref 70–99)
Glucose-Capillary: 67 mg/dL — ABNORMAL LOW (ref 70–99)
Glucose-Capillary: 149 mg/dL — ABNORMAL HIGH (ref 70–99)
Glucose-Capillary: 66 mg/dL — ABNORMAL LOW (ref 70–99)
Glucose-Capillary: 90 mg/dL (ref 70–99)

## 2018-05-02 MED ORDER — INSULIN ASPART 100 UNIT/ML ~~LOC~~ SOLN
10.0000 [IU] | Freq: Three times a day (TID) | SUBCUTANEOUS | Status: DC
  Administered 2018-05-02 – 2018-05-05 (×8): 10 [IU] via SUBCUTANEOUS

## 2018-05-02 MED ORDER — INSULIN GLARGINE 100 UNIT/ML ~~LOC~~ SOLN
8.0000 [IU] | Freq: Every day | SUBCUTANEOUS | Status: DC
  Administered 2018-05-02 – 2018-05-08 (×7): 8 [IU] via SUBCUTANEOUS
  Filled 2018-05-02 (×8): qty 0.08

## 2018-05-02 NOTE — Progress Notes (Signed)
Pt has been asked several times this morning about monitoring and was reminded that we would like to monitor her baby 3 times a day and why. Pt stated she would call out when she was ready.

## 2018-05-02 NOTE — Progress Notes (Signed)
FACULTY PRACTICE ANTEPARTUM(COMPREHENSIVE) NOTE  Kathryn Shelton is a 27 y.o. G3P2002 at [redacted]w[redacted]d who is admitted for PPROM on 10/15, 2019 who opted for expectant management.  She is was admitted to the hospital at 24 weeks and remains stable condition. Fetal presentation is cephalic. Length of Stay:  30  Days  Subjective: BPP was 8/8 on 12 /10/2017 Patient reports the fetal movement as active. Patient reports uterine contraction  activity as none. Patient reports  vaginal bleeding as none. Patient describes fluid per vagina as Other minimal fluid 1 pad per day used.  Vitals:  Blood pressure 108/77, pulse 98, temperature 98.9 F (37.2 C), temperature source Oral, resp. rate 18, height 5\' 6"  (1.676 m), weight 96.6 kg, last menstrual period 10/07/2017, SpO2 100 %. Physical Examination:  General appearance - alert, well appearing, and in no distress and overweight Heart - normal rate and regular rhythm Abdomen - soft, nontender, nondistended Fundal Height:  size equals dates Cervical Exam: Not evaluated. A Extremities: extremities normal, atraumatic, no cyanosis or edema and Homans sign is negative, no sign of DVT with DTRs 2+ bilaterally Membranes:intact, ruptured  Fetal Monitoring:  Baseline: 135 bpm, Variability: Good {> 6 bpm), Accelerations: Reactive and Decelerations: Absent  Labs:  Results for orders placed or performed during the hospital encounter of 04/02/18 (from the past 24 hour(s))  Glucose, capillary   Collection Time: 05/01/18 11:36 AM  Result Value Ref Range   Glucose-Capillary 157 (H) 70 - 99 mg/dL  Glucose, capillary   Collection Time: 05/01/18  1:51 PM  Result Value Ref Range   Glucose-Capillary 103 (H) 70 - 99 mg/dL  Glucose, capillary   Collection Time: 05/01/18  8:31 PM  Result Value Ref Range   Glucose-Capillary 153 (H) 70 - 99 mg/dL  Glucose, capillary   Collection Time: 05/02/18  3:08 AM  Result Value Ref Range   Glucose-Capillary 120 (H) 70 - 99 mg/dL   CBC   Collection Time: 05/02/18  5:31 AM  Result Value Ref Range   WBC 6.3 4.0 - 10.5 K/uL   RBC 3.75 (L) 3.87 - 5.11 MIL/uL   Hemoglobin 10.5 (L) 12.0 - 15.0 g/dL   HCT 40.9 (L) 81.1 - 91.4 %   MCV 84.3 80.0 - 100.0 fL   MCH 28.0 26.0 - 34.0 pg   MCHC 33.2 30.0 - 36.0 g/dL   RDW 78.2 95.6 - 21.3 %   Platelets 302 150 - 400 K/uL   nRBC 0.0 0.0 - 0.2 %  Type and screen [facility] [place]   Collection Time: 05/02/18  5:31 AM  Result Value Ref Range   ABO/RH(D) A POS    Antibody Screen NEG    Sample Expiration      05/05/2018 Performed at [facility], [address], [place], [place] [address]     Imaging Studies:      Medications:  Scheduled . aspirin EC  81 mg Oral Daily  . docusate sodium  100 mg Oral Daily  . Influenza vac split quadrivalent PF  0.5 mL Intramuscular Tomorrow-1000  . insulin aspart  0-16 Units Subcutaneous TID PC  . insulin aspart  7 Units Subcutaneous TID WC  . insulin glargine  17 Units Subcutaneous QAC breakfast  . insulin glargine  4 Units Subcutaneous QHS  . loratadine  10 mg Oral Daily  . prenatal multivitamin  1 tablet Oral Q1200   I have reviewed the patient's current medications.  ASSESSMENT: Patient Active Problem List   Diagnosis Date Noted  .  Preterm premature rupture of membranes, unspecified as to length of time between rupture and onset of labor, second trimester 03/09/2018    Priority: Low  . Supervision of high risk pregnancy, antepartum 12/31/2017    Priority: Low  . Oligohydramnios antepartum, second trimester, other fetus 03/09/2018  . Obesity, morbid ([facility]) 03/09/2018  . Chronic hypertension during pregnancy, antepartum 12/31/2017  . Pre-existing type 2 diabetes mellitus during pregnancy, antepartum 12/31/2017  . Mixed hyperlipidemia 12/02/2017  . Type II diabetes mellitus, uncontrolled ([facility]) 03/01/2017  . Essential hypertension 03/01/2017  . Intermittent palpitations 03/01/2017  . Panic attack 03/01/2017   . Obstructive sleep apnea syndrome 03/01/2017    PLAN: Adjust insulin to 10 u aspart with meals Increase HS glargine to 8 units HS bpp's scheduled ) Serial U/S every 4 weeks for fetal  growth  2) Twice weekly BPP 3) Delivery @ 34 weeks   Kathryn Shelton 05/02/2018,10:03 AM    Patient ID: Kathryn Shelton, female   DOB: 10/16/1990, 27 y.o.   MRN: [id number]

## 2018-05-03 ENCOUNTER — Inpatient Hospital Stay (HOSPITAL_BASED_OUTPATIENT_CLINIC_OR_DEPARTMENT_OTHER): Payer: Medicaid Other

## 2018-05-03 DIAGNOSIS — O4290 Premature rupture of membranes, unspecified as to length of time between rupture and onset of labor, unspecified weeks of gestation: Secondary | ICD-10-CM

## 2018-05-03 DIAGNOSIS — O10012 Pre-existing essential hypertension complicating pregnancy, second trimester: Secondary | ICD-10-CM

## 2018-05-03 DIAGNOSIS — O24112 Pre-existing diabetes mellitus, type 2, in pregnancy, second trimester: Secondary | ICD-10-CM

## 2018-05-03 DIAGNOSIS — Z3A27 27 weeks gestation of pregnancy: Secondary | ICD-10-CM

## 2018-05-03 LAB — GLUCOSE, CAPILLARY
Glucose-Capillary: 83 mg/dL (ref 70–99)
Glucose-Capillary: 80 mg/dL (ref 70–99)
Glucose-Capillary: 235 mg/dL — ABNORMAL HIGH (ref 70–99)
Glucose-Capillary: 74 mg/dL (ref 70–99)
Glucose-Capillary: 135 mg/dL — ABNORMAL HIGH (ref 70–99)

## 2018-05-03 NOTE — Progress Notes (Signed)
Refuses monitoring.

## 2018-05-03 NOTE — Progress Notes (Signed)
This note also relates to the following rows which could not be included: CBG Lab Component - View only - Cannot attach notes to extension rows  Patient refuses to be monitored at this time.

## 2018-05-03 NOTE — Progress Notes (Signed)
FACULTY PRACTICE ANTEPARTUM(COMPREHENSIVE) NOTE  Kathryn SerOctavia Stefano is a 27 y.o. G3P2002 at 6134w1d who is admitted for PPROM on 10/15, 2019 who opted for expectant management.  She is was admitted to the hospital at 24 weeks and remains stable condition. Fetal presentation is cephalic. Length of Stay:  31  Days  Subjective: BPP was 8/8 on 12 /10/2017 Patient reports the fetal movement as active. Patient reports uterine contraction  activity as none. Patient reports  vaginal bleeding as none. Patient describes fluid per vagina as Other minimal fluid 1 pad per day used.  Vitals:  Blood pressure (!) 91/59, pulse 92, temperature 98 F (36.7 C), temperature source Oral, resp. rate 18, height 5\' 6"  (1.676 m), weight 96.6 kg, last menstrual period 10/07/2017, SpO2 99 %. Physical Examination:  General appearance - alert, well appearing, and in no distress and overweight Heart - normal rate and regular rhythm Abdomen - soft, nontender, nondistended Fundal Height:  size equals dates Cervical Exam: Not evaluated. A Extremities: extremities normal, atraumatic, no cyanosis or edema and Homans sign is negative, no sign of DVT with DTRs 2+ bilaterally Membranes:intact, ruptured  Fetal Monitoring:  Baseline: 135 bpm, Variability: Good {> 6 bpm), Accelerations: Reactive and Decelerations: Absent  Labs:  Results for orders placed or performed during the hospital encounter of 04/02/18 (from the past 24 hour(s))  Glucose, capillary   Collection Time: 05/02/18 10:14 AM  Result Value Ref Range   Glucose-Capillary 67 (L) 70 - 99 mg/dL  Glucose, capillary   Collection Time: 05/02/18 12:23 PM  Result Value Ref Range   Glucose-Capillary 149 (H) 70 - 99 mg/dL  Glucose, capillary   Collection Time: 05/02/18  4:15 PM  Result Value Ref Range   Glucose-Capillary 66 (L) 70 - 99 mg/dL  Glucose, capillary   Collection Time: 05/02/18  8:26 PM  Result Value Ref Range   Glucose-Capillary 90 70 - 99 mg/dL  Glucose,  capillary   Collection Time: 05/03/18  3:14 AM  Result Value Ref Range   Glucose-Capillary 83 70 - 99 mg/dL    Imaging Studies:      Medications:  Scheduled . aspirin EC  81 mg Oral Daily  . docusate sodium  100 mg Oral Daily  . Influenza vac split quadrivalent PF  0.5 mL Intramuscular Tomorrow-1000  . insulin aspart  0-16 Units Subcutaneous TID PC  . insulin aspart  10 Units Subcutaneous TID WC  . insulin glargine  17 Units Subcutaneous QAC breakfast  . insulin glargine  8 Units Subcutaneous QHS  . loratadine  10 mg Oral Daily  . prenatal multivitamin  1 tablet Oral Q1200   I have reviewed the patient's current medications.  ASSESSMENT: Patient Active Problem List   Diagnosis Date Noted  . Oligohydramnios antepartum, second trimester, other fetus 03/09/2018  . Obesity, morbid ([facility]) 03/09/2018  . Preterm premature rupture of membranes, unspecified as to length of time between rupture and onset of labor, second trimester 03/09/2018  . Supervision of high risk pregnancy, antepartum 12/31/2017  . Chronic hypertension during pregnancy, antepartum 12/31/2017  . Pre-existing type 2 diabetes mellitus during pregnancy, antepartum 12/31/2017  . Mixed hyperlipidemia 12/02/2017  . Type II diabetes mellitus, uncontrolled ([facility]) 03/01/2017  . Essential hypertension 03/01/2017  . Intermittent palpitations 03/01/2017  . Panic attack 03/01/2017  . Obstructive sleep apnea syndrome 03/01/2017    PLAN: Adjust insulin to 10 u aspart with meals Increase HS glargine to 8 units HS bpp's scheduled ) Serial U/S every 4 weeks for fetal  growth  2) Twice weekly BPP 3) Delivery @ 34 weeks   Lazaro Arms 05/03/2018,8:02 AM    Patient ID: Kathryn Shelton, female   DOB: 06-21-1990, 27 y.o.   MRN: [id number] Patient ID: Tazia Illescas, female   DOB: 1990-07-02, 27 y.o.   MRN: [id number]

## 2018-05-03 NOTE — Progress Notes (Signed)
Pt. Refuses monitoring. Pt. Educated on importance. Pt. Asked if she would be woken at [facility] for CBG and notified she would be. Reminded her if she chose she could be placed on monitor at that time.

## 2018-05-03 NOTE — Progress Notes (Signed)
Patient still refusing monitoring.

## 2018-05-04 LAB — GLUCOSE, CAPILLARY
GLUCOSE-CAPILLARY: 95 mg/dL (ref 70–99)
GLUCOSE-CAPILLARY: 147 mg/dL — AB (ref 70–99)
Glucose-Capillary: 76 mg/dL (ref 70–99)
Glucose-Capillary: 224 mg/dL — ABNORMAL HIGH (ref 70–99)
Glucose-Capillary: 39 mg/dL — CL (ref 70–99)
Glucose-Capillary: 64 mg/dL — ABNORMAL LOW (ref 70–99)
Glucose-Capillary: 123 mg/dL — ABNORMAL HIGH (ref 70–99)
Glucose-Capillary: 206 mg/dL — ABNORMAL HIGH (ref 70–99)

## 2018-05-04 NOTE — Progress Notes (Signed)
FACULTY PRACTICE ANTEPARTUM(COMPREHENSIVE) NOTE  Kathryn SerOctavia Shelton is a 27 y.o. G3P2002 at 6653w2d who is admitted for PPROM on 10/15, 2019 who opted for expectant management.  She is was admitted to the hospital at 24 weeks and remains stable condition. Fetal presentation is cephalic. Length of Stay:  32  Days  Subjective: No complaints. Had a hypoglycemic episode earlier because she skipped lunch, knows not to do this! Patient reports the fetal movement as active. Patient reports uterine contraction  activity as none. Patient reports  vaginal bleeding as none. Patient describes fluid per vagina as Other minimal fluid 1 pad per day used.  Vitals:  Blood pressure (!) 86/48, pulse 91, temperature 98.6 F (37 C), temperature source Oral, resp. rate 18, height 5\' 6"  (1.676 m), weight 96.6 kg, last menstrual period 10/07/2017, SpO2 100 %. Physical Examination:  General appearance - alert, well appearing, and in no distress and overweight Heart - normal rate and regular rhythm Abdomen - soft, nontender, nondistended Fundal Height:  size equals dates Cervical Exam: Not evaluated. A Extremities: extremities normal, atraumatic, no cyanosis or edema and Homans sign is negative, no sign of DVT with DTRs 2+ bilaterally Membranes:intact, ruptured  Fetal Monitoring:  Baseline: 135 bpm, Variability: Good {> 6 bpm), Accelerations: Reactive and Decelerations: Absent  Labs:  Results for orders placed or performed during the hospital encounter of 04/02/18 (from the past 24 hour(s))  Glucose, capillary   Collection Time: 05/03/18 10:37 PM  Result Value Ref Range   Glucose-Capillary 135 (H) 70 - 99 mg/dL  Glucose, capillary   Collection Time: 05/04/18  3:08 AM  Result Value Ref Range   Glucose-Capillary 95 70 - 99 mg/dL  Glucose, capillary   Collection Time: 05/04/18  8:14 AM  Result Value Ref Range   Glucose-Capillary 76 70 - 99 mg/dL  Glucose, capillary   Collection Time: 05/04/18 11:30 AM  Result  Value Ref Range   Glucose-Capillary 224 (H) 70 - 99 mg/dL  Glucose, capillary   Collection Time: 05/04/18  2:35 PM  Result Value Ref Range   Glucose-Capillary 39 (LL) 70 - 99 mg/dL   Comment 1 Notify RN   Glucose, capillary   Collection Time: 05/04/18  3:11 PM  Result Value Ref Range   Glucose-Capillary 64 (L) 70 - 99 mg/dL  Glucose, capillary   Collection Time: 05/04/18  3:35 PM  Result Value Ref Range   Glucose-Capillary 123 (H) 70 - 99 mg/dL    Imaging Studies:      Medications:  Scheduled . aspirin EC  81 mg Oral Daily  . docusate sodium  100 mg Oral Daily  . Influenza vac split quadrivalent PF  0.5 mL Intramuscular Tomorrow-1000  . insulin aspart  0-16 Units Subcutaneous TID PC  . insulin aspart  10 Units Subcutaneous TID WC  . insulin glargine  17 Units Subcutaneous QAC breakfast  . insulin glargine  8 Units Subcutaneous QHS  . loratadine  10 mg Oral Daily  . prenatal multivitamin  1 tablet Oral Q1200   I have reviewed the patient's current medications.  ASSESSMENT: Patient Active Problem List   Diagnosis Date Noted  . Oligohydramnios antepartum, second trimester, other fetus 03/09/2018  . Obesity, morbid (HCC) 03/09/2018  . Preterm premature rupture of membranes, unspecified as to length of time between rupture and onset of labor, second trimester 03/09/2018  . Supervision of high risk pregnancy, antepartum 12/31/2017  . Chronic hypertension during pregnancy, antepartum 12/31/2017  . Pre-existing type 2 diabetes mellitus during pregnancy,  antepartum 12/31/2017  . Mixed hyperlipidemia 12/02/2017  . Type II diabetes mellitus, uncontrolled (HCC) 03/01/2017  . Essential hypertension 03/01/2017  . Intermittent palpitations 03/01/2017  . Panic attack 03/01/2017  . Obstructive sleep apnea syndrome 03/01/2017    PLAN: Continue insulin regimen and regular meals No need for twice weekly BPP, weekly AFIs with daily NSTs is sufficient for now. Continue inpatient  care  Jaynie Collins, MD 05/04/2018,4:32 PM

## 2018-05-04 NOTE — Progress Notes (Signed)
Pt. Allows for inj. Of lantus only at this time.

## 2018-05-04 NOTE — Progress Notes (Signed)
Hypoglycemic Event Pt called out at 14:30, stating she was shaky and weak. CBG: 39 @ 14:35  Treatment: put in hypoglycemic order set, provided 8 oz apple juice, and 2 packs of graham crackers  Symptoms: shaky, weak, diaphoretic  Follow-up CBG: Time: 15:11 CBG Result: 64  Possible Reasons for Event: Patient did not eat since 09:30 am.  Comments/MD notified: Dr. Macon LargeAnyanwu on unit and notified. Told to initiate the hypoglycemic protocol.    Laury AxonJennifer K Mylon Mabey

## 2018-05-05 LAB — TYPE AND SCREEN
ABO/RH(D): A POS
Antibody Screen: NEGATIVE

## 2018-05-05 LAB — CBC
HCT: 33.9 % — ABNORMAL LOW (ref 36.0–46.0)
HEMOGLOBIN: 11.1 g/dL — AB (ref 12.0–15.0)
MCH: 27.5 pg (ref 26.0–34.0)
MCHC: 32.7 g/dL (ref 30.0–36.0)
MCV: 84.1 fL (ref 80.0–100.0)
Platelets: 325 10*3/uL (ref 150–400)
RBC: 4.03 MIL/uL (ref 3.87–5.11)
RDW: 13.6 % (ref 11.5–15.5)
WBC: 6.5 10*3/uL (ref 4.0–10.5)
nRBC: 0 % (ref 0.0–0.2)

## 2018-05-05 LAB — GLUCOSE, CAPILLARY
GLUCOSE-CAPILLARY: 138 mg/dL — AB (ref 70–99)
Glucose-Capillary: 87 mg/dL (ref 70–99)
Glucose-Capillary: 79 mg/dL (ref 70–99)
Glucose-Capillary: 149 mg/dL — ABNORMAL HIGH (ref 70–99)

## 2018-05-05 MED ORDER — INSULIN ASPART 100 UNIT/ML ~~LOC~~ SOLN
12.0000 [IU] | Freq: Three times a day (TID) | SUBCUTANEOUS | Status: DC
  Administered 2018-05-05 – 2018-05-12 (×19): 12 [IU] via SUBCUTANEOUS

## 2018-05-05 NOTE — Progress Notes (Addendum)
Inpatient Diabetes Program Recommendations  AACE/ADA: New Consensus Statement on Inpatient Glycemic Control (2015)  Target Ranges:  Prepandial:   less than 140 mg/dL      Peak postprandial:   less than 180 mg/dL (1-2 hours)      Critically ill patients:  140 - 180 mg/dL   Lab Results  Component Value Date   GLUCAP 138 (H) 05/05/2018   HGBA1C 8.2 (H) 01/14/2018    Review of Glycemic Control Results for Kathryn Shelton, Kathryn Shelton (MRN [id number]) as of 05/05/2018 15:11  Ref. Range 05/05/2018 02:56 05/05/2018 09:09 05/05/2018 12:28  Glucose-Capillary Latest Ref Range: 70 - 99 mg/dL [id number] (H)   Diabetes history:Type 2 DM Outpatient Diabetes medications:Lantus 10 units QHS Current orders for Inpatient glycemic control:Lantus8units QHS, Lantus 17units QAM, Novolog12units TID, Novolog 0-16 units TID  Inpatient Diabetes Program Recommendations:  Noted severe hypoglycemia of 39 mg/dL on 95/6212/10 following administration of correction and meal coverage.  Also, insulin was adjusted to Novolog 12 units TID. Per MD note, patient has been counseled on importance of eating meals TID. However, patient remain high risk for hypoglycemia.   Consider decreasing correction to 0-14 units TID under the pregnant patient order set.   Thanks, Kathryn RaveLauren Rumi Taras, MSN, RNC-OB Diabetes Coordinator [phone] ([id number])

## 2018-05-05 NOTE — Progress Notes (Signed)
FACULTY PRACTICE ANTEPARTUM(COMPREHENSIVE) NOTE  Kathryn Shelton is a 27 y.o. G3P2002 at [redacted]w[redacted]d who is admitted for PPROM on 10/15, 2019 who opted for expectant management.  She is was admitted to the hospital at 24 weeks and remains stable condition. Fetal presentation is cephalic. Length of Stay:  33  Days  Subjective: No complaints.Very frustrated about 3 am fingersticks, tid fetal monitoring.  Feels it is too much! Patient reports the fetal movement as active. Patient reports uterine contraction  activity as none. Patient reports  vaginal bleeding as none. Patient describes fluid per vagina as minimal fluid 1 pad per day used.  Vitals:  Blood pressure 103/78, pulse 95, temperature 97.9 F (36.6 C), temperature source Oral, resp. rate 18, height 5\' 6"  (1.676 m), weight 100.6 kg, last menstrual period 10/07/2017, SpO2 99 %. Physical Examination:  General appearance - alert, well appearing, and in no distress and overweight Heart - normal rate and regular rhythm Abdomen - soft, nontender, nondistended Fundal Height:  size equals dates Cervical Exam: Not evaluated. A Extremities: extremities normal, atraumatic, no cyanosis or edema and Homans sign is negative, no sign of DVT with DTRs 2+ bilaterally Membranes:intact, ruptured  Fetal Monitoring:  Baseline: 145 bpm, Variability: Moderate {> 6 bpm), Accelerations: Reactive and Decelerations: Absent  Labs:  Results for orders placed or performed during the hospital encounter of 04/02/18 (from the past 24 hour(s))  Glucose, capillary   Collection Time: 05/04/18 11:30 AM  Result Value Ref Range   Glucose-Capillary 224 (H) 70 - 99 mg/dL  Glucose, capillary   Collection Time: 05/04/18  2:35 PM  Result Value Ref Range   Glucose-Capillary 39 (LL) 70 - 99 mg/dL   Comment 1 Notify RN   Glucose, capillary   Collection Time: 05/04/18  3:11 PM  Result Value Ref Range   Glucose-Capillary 64 (L) 70 - 99 mg/dL  Glucose, capillary   Collection  Time: 05/04/18  3:35 PM  Result Value Ref Range   Glucose-Capillary 123 (H) 70 - 99 mg/dL  Glucose, capillary   Collection Time: 05/04/18  6:38 PM  Result Value Ref Range   Glucose-Capillary 206 (H) 70 - 99 mg/dL  Glucose, capillary   Collection Time: 05/04/18  9:55 PM  Result Value Ref Range   Glucose-Capillary 147 (H) 70 - 99 mg/dL  Glucose, capillary   Collection Time: 05/05/18  2:56 AM  Result Value Ref Range   Glucose-Capillary 87 70 - 99 mg/dL  Type and screen [facility]   Collection Time: 05/05/18  5:28 AM  Result Value Ref Range   ABO/RH(D) A POS    Antibody Screen NEG    Sample Expiration      05/08/2018 Performed at [facility], [address] [place], [place] [address]   CBC   Collection Time: 05/05/18  5:38 AM  Result Value Ref Range   WBC 6.5 4.0 - 10.5 K/uL   RBC 4.03 3.87 - 5.11 MIL/uL   Hemoglobin 11.1 (L) 12.0 - 15.0 g/dL   HCT 40.9 (L) 81.1 - 91.4 %   MCV 84.1 80.0 - 100.0 fL   MCH 27.5 26.0 - 34.0 pg   MCHC 32.7 30.0 - 36.0 g/dL   RDW 78.2 95.6 - 21.3 %   Platelets 325 150 - 400 K/uL   nRBC 0.0 0.0 - 0.2 %  Glucose, capillary   Collection Time: 05/05/18  9:09 AM  Result Value Ref Range   Glucose-Capillary 79 70 - 99 mg/dL   CBC Latest Ref  Rng & Units 05/05/2018 05/02/2018 04/29/2018  WBC 4.0 - 10.5 K/uL 6.5 6.3 4.7  Hemoglobin 12.0 - 15.0 g/dL 11.1(L) 10.5(L) 10.2(L)  Hematocrit 36.0 - 46.0 % 33.9(L) 31.6(L) 31.1(L)  Platelets 150 - 400 K/uL 325 302 271    Imaging Studies:    [place] Mfm Fetal Bpp Wo Non Stress  Result Date: 05/04/2018 ----------------------------------------------------------------------  OBSTETRICS REPORT                       (Signed Final 05/04/2018 03:42 pm) ---------------------------------------------------------------------- Patient Info  ID #:       [id number]                          D.O.B.:  [birth year] (27 yrs)  Name:       Kathryn Shelton                  Visit Date: 05/03/2018 05:33 pm  ---------------------------------------------------------------------- Performed By  Performed By:     Emeline Darling BS,      Ref. Address:     [address]                    [place]                                                             Rd  Attending:        Lin Landsman      Secondary Phy.:   3rd Nursing- HR                    MD                                                             OB                                                             3rd Floor  Referred By:      Conan Bowens          Location:         [facility]                    MD ---------------------------------------------------------------------- Orders   #  Description                          Code         Ordered By   1  [place] MFM FETAL BPP WO NON              40981.19     Tommie Dejoseph      STRESS  ----------------------------------------------------------------------   #  Order #  Accession #                 Episode #   1  [id number]                  [id number]                  [id number]  ---------------------------------------------------------------------- Indications   Premature rupture of membranes - leaking       O42.90   fluid   Hypertension - Chronic/Pre-existing (no        O10.019   meds)   Pre-existing diabetes, type 2, in pregnancy,   O24.112   second trimester (metformin, insulin, not   currently taking either)   [redacted] weeks gestation of pregnancy                Z3A.27  ---------------------------------------------------------------------- Fetal Evaluation  Num Of Fetuses:         1  Fetal Heart Rate(bpm):  138  Cardiac Activity:       Observed  Presentation:           Cephalic  Placenta:               Right lateral  P. Cord Insertion:      Previously Visualized  Amniotic Fluid  AFI FV:      Subjectively decreased                              Largest Pocket(cm)                              4.5 ---------------------------------------------------------------------- Biophysical Evaluation  Amniotic  F.V:   Pocket => 2 cm two         F. Tone:        Observed                  planes  F. Movement:    Observed                   Score:          8/8  F. Breathing:   Observed ---------------------------------------------------------------------- OB History  Gravidity:    3         Term:   2        Prem:   0        SAB:   0  TOP:          0       Ectopic:  0        Living: 2 ---------------------------------------------------------------------- Gestational Age  LMP:           29w 5d        Date:  10/07/17                 EDD:   07/14/18  Best:          27w 1d     Det. By:  Marcella DubsEarly Ultrasound         EDD:   08/01/18                                      (12/22/17) ---------------------------------------------------------------------- Impression  Biophysical profile  AFI subjectively low but normal MVP ---------------------------------------------------------------------- Recommendations  Management per inpatient.  Consider serial growth ----------------------------------------------------------------------  Lin Landsman, MD Electronically Signed Final Report   05/04/2018 03:42 pm ----------------------------------------------------------------------  [place] Mfm Fetal Bpp Wo Non Stress  Result Date: 04/30/2018 ----------------------------------------------------------------------  OBSTETRICS REPORT                       (Signed Final 04/30/2018 10:41 am) ---------------------------------------------------------------------- Patient Info  ID #:       [id number]                          D.O.B.:  [birth year] (27 yrs)  Name:       Kathryn Shelton                  Visit Date: 04/30/2018 07:27 am ---------------------------------------------------------------------- Performed By  Performed By:     Vivien Rota        Ref. Address:     [address]                    [place]                                                             Rd  Attending:        Patsi Sears      Secondary Phy.:   3rd Nursing- HR                     MD                                                             OB                                                             3rd Floor  Referred By:      Conan Bowens          Location:         [facility]                    MD ---------------------------------------------------------------------- Orders   #  Description                          Code         Ordered By   1  [place] MFM OB LIMITED                    76815.01     Aahana Elza   2  [place] MFM FETAL BPP WO NON              40981.19     Nehemiah Mcfarren      STRESS  ----------------------------------------------------------------------   #  Order #  Accession #                 Episode #   1  [id number]                  [id number]                  [id number]   2  [id number]                  [id number]                  [id number]  ---------------------------------------------------------------------- Indications   Premature rupture of membranes - leaking       O42.90   fluid   Hypertension - Chronic/Pre-existing (no        O10.019   meds)   Pre-existing diabetes, type 2, in pregnancy,   O24.112   second trimester (metformin, insulin, not   currently taking either)   [redacted] weeks gestation of pregnancy                Z3A.26  ---------------------------------------------------------------------- Fetal Evaluation  Num Of Fetuses:         1  Fetal Heart Rate(bpm):  142  Cardiac Activity:       Observed  Presentation:           Cephalic  Placenta:               Right lateral  Amniotic Fluid  AFI FV:      Subjectively decreased                              Largest Pocket(cm)                              3.51 ---------------------------------------------------------------------- Biophysical Evaluation  Amniotic F.V:   Pocket => 2 cm two         F. Tone:        Observed                  planes  F. Movement:    Observed                   Score:          8/8  F. Breathing:   Observed  ---------------------------------------------------------------------- OB History  Gravidity:    3         Term:   2        Prem:   0        SAB:   0  TOP:          0       Ectopic:  0        Living: 2 ---------------------------------------------------------------------- Gestational Age  LMP:           29w 2d        Date:  10/07/17                 EDD:   07/14/18  Best:          Altamese Cabal 5d     Det. ByMarcella Dubs         EDD:   08/01/18                                      (  12/22/17) ---------------------------------------------------------------------- Anatomy  Stomach:               Appears normal, left   Bladder:                Appears normal                         sided ---------------------------------------------------------------------- Cervix Uterus Adnexa  Cervix  Not visualized (advanced GA >24wks)  Uterus  No abnormality visualized.  Left Ovary  Not visualized.  Right Ovary  Not visualized.  Adnexa  No abnormality visualized. No adnexal mass  visualized. ---------------------------------------------------------------------- Comments  U/S images reviewed. Findings reviewed.  No evidence of  fetal compromise is found on BPP today.  No fetal  abnormalities are seen.   Recommendations: 1) Serial U/S every 4 weeks for fetal  growth  2) Twice weekly 3) Delivery @ 34 weeks ---------------------------------------------------------------------- Recommendations  1) Serial U/S every 4 weeks for fetal growth  2) Twice weekly  3) Delivery @ 34 weeks ----------------------------------------------------------------------               Patsi Sears, MD Electronically Signed Final Report   04/30/2018 10:41 am ----------------------------------------------------------------------  [place] Mfm Fetal Bpp Wo Non Stress  Result Date: 04/28/2018 ----------------------------------------------------------------------  OBSTETRICS REPORT                       (Signed Final 04/28/2018 02:53 pm)  ---------------------------------------------------------------------- Patient Info  ID #:       [id number]                          D.O.B.:  1990/09/29 (27 yrs)  Name:       Kathryn Shelton                  Visit Date: 04/28/2018 01:59 pm ---------------------------------------------------------------------- Performed By  Performed By:     Earley Brooke     Ref. Address:     [address]                    [place], [place]                                                             Rd  Attending:        Patsi Sears      Secondary Phy.:   3rd Nursing- HR                    MD                                                             OB  3rd Floor  Referred By:      Ivory Broad DAVIS          Location:         [facility]                    MD ---------------------------------------------------------------------- Orders   #  Description                          Code         Ordered By   1  [place] MFM FETAL BPP WO NON              76819.01     KELLY DAVIS      STRESS  ----------------------------------------------------------------------   #  Order #                    Accession #                 Episode #   1  [id number]                  [id number]                  [id number]  ---------------------------------------------------------------------- Indications   Premature rupture of membranes - leaking       O42.90   fluid   [redacted] weeks gestation of pregnancy                Z3A.26   Hypertension - Chronic/Pre-existing (no        O10.019   meds)   Pre-existing diabetes, type 2, in pregnancy,   O24.112   second trimester (metformin, insulin, not   currently taking either)  ---------------------------------------------------------------------- Fetal Evaluation  Num Of Fetuses:         1  Fetal Heart Rate(bpm):  142  Cardiac Activity:       Observed  Presentation:           Cephalic  Amniotic Fluid  AFI FV:      Subjectively decreased  AFI Sum(cm)     %Tile        Largest Pocket(cm)  6.72            < 3         3.54  RUQ(cm)       RLQ(cm)       LUQ(cm)        LLQ(cm)  3.54          1.04          0.91           1.23 ---------------------------------------------------------------------- Biophysical Evaluation  Amniotic F.V:   Pocket => 2 cm two         F. Tone:        Observed                  planes  F. Movement:    Observed                   Score:          8/8  F. Breathing:   Observed ---------------------------------------------------------------------- OB History  Gravidity:    3         Term:   2        Prem:   0        SAB:   0  TOP:  0       Ectopic:  0        Living: 2 ---------------------------------------------------------------------- Gestational Age  LMP:           29w 0d        Date:  10/07/17                 EDD:   07/14/18  Best:          Altamese Cabal 3d     Det. ByMarcella Dubs         EDD:   08/01/18                                      (12/22/17) ---------------------------------------------------------------------- Comments  U/S images reviewed. Findings reviewed with patient.   No  evidence of fetal compromise is found on BPP today.  No  fetal abnormalities are seen.  Mild oligohydramnios is present.   Questions answered.  Translator utilized.  10 minutes spent face to face with patient.  Recommendations: 1) Serial U/S every 4 weeks for fetal  growth  2) Twice weekly BPP  3) Delivery @ 34 weeks ---------------------------------------------------------------------- Recommendations   1) Serial U/S every 4 weeks for fetal growth  2) Twice weekly  BPP  3) Delivery @ 34 weeks ----------------------------------------------------------------------               Patsi Sears, MD Electronically Signed Final Report   04/28/2018 02:53 pm ----------------------------------------------------------------------  [place] Mfm Ob Follow Up  Result Date: 04/23/2018 ----------------------------------------------------------------------  OBSTETRICS REPORT                         (Signed Final 04/23/2018 10:17 am) ---------------------------------------------------------------------- Patient Info  ID #:       [id number]                          D.O.B.:  November 16, 1990 (27 yrs)  Name:       Kathryn Shelton                  Visit Date: 04/23/2018 08:03 am ---------------------------------------------------------------------- Performed By  Performed By:     Birdena Crandall        Ref. Address:      [address]                    [place]                                                              Rd  Attending:        Lin Landsman      Secondary Phy.:    3rd Nursing- HR                    MD  OB                                                              3rd Floor  Referred By:      Ivory Broad DAVIS          Location:          [facility]                    MD ---------------------------------------------------------------------- Orders   #  Description                          Code         Ordered By   1  [place] MFM OB FOLLOW UP                  76816.01     KELLY DAVIS  ----------------------------------------------------------------------   #  Order #                    Accession #                 Episode #   1  [id number]                  [id number]                  [id number]  ---------------------------------------------------------------------- Indications   Premature rupture of membranes - leaking       O42.90   fluid   Hypertension - Chronic/Pre-existing (no        O10.019   meds)   Pre-existing diabetes, type 2, in pregnancy,   O24.112   second trimester (metformin, insulin, not   currently taking either)   [redacted] weeks gestation of pregnancy                Z3A.25  ---------------------------------------------------------------------- Fetal Evaluation  Num Of Fetuses:          1  Fetal Heart Rate(bpm):   131  Cardiac Activity:        Observed  Presentation:            Cephalic  Placenta:                Right lateral   Amniotic Fluid  AFI FV:      Oligohydramnios                              Largest Pocket(cm)                              3.08 ---------------------------------------------------------------------- Biometry  BPD:      64.2  mm     G. Age:  26w 0d         49  %    CI:        70.73   %    70 - 86  FL/HC:       18.6  %    18.6 - 20.4  HC:      243.3  mm     G. Age:  26w 3d         51  %    HC/AC:       1.10       1.04 - 1.22  AC:      220.6  mm     G. Age:  26w 3d         66  %    FL/BPD:      70.4  %    71 - 87  FL:       45.2  mm     G. Age:  25w 0d         17  %    FL/AC:       20.5  %    20 - 24  HUM:      41.6  mm     G. Age:  25w 1d         29  %  LV:        7.6  mm  Est. FW:     868   gm   1 lb 15 oz      58  % ---------------------------------------------------------------------- OB History  Gravidity:    3         Term:   2        Prem:   0        SAB:   0  TOP:          0       Ectopic:  0        Living: 2 ---------------------------------------------------------------------- Gestational Age  LMP:           28w 2d        Date:  10/07/17                 EDD:   07/14/18  U/S Today:     26w 0d                                        EDD:   07/30/18  Best:          25w 5d     Det. ByMarcella Dubs         EDD:   08/01/18                                      (12/22/17) ---------------------------------------------------------------------- Anatomy  Ventricles:            Appears normal         Stomach:                Appears normal, left                                                                        sided  Cerebellum:  Appears normal         Kidneys:                Appear normal  Posterior Fossa:       Appears normal         Bladder:                Appears normal ---------------------------------------------------------------------- Cervix Uterus Adnexa  Cervix  Length:           3.84  cm.  Normal appearance by transabdominal scan.  ---------------------------------------------------------------------- Impression  PPROM  Inpatient  MVP 3 however subjectively low fluid. ---------------------------------------------------------------------- Recommendations  Follow up per inpatient team. ----------------------------------------------------------------------               Lin Landsman, MD Electronically Signed Final Report   04/23/2018 10:17 am ----------------------------------------------------------------------  [place] Mfm Ob Limited  Result Date: 04/30/2018 ----------------------------------------------------------------------  OBSTETRICS REPORT                       (Signed Final 04/30/2018 10:41 am) ---------------------------------------------------------------------- Patient Info  ID #:       [id number]                          D.O.B.:  Nov 28, 1990 (27 yrs)  Name:       Kathryn Shelton                  Visit Date: 04/30/2018 07:27 am ---------------------------------------------------------------------- Performed By  Performed By:     Vivien Rota        Ref. Address:     [address]                    [place]                                                             Rd  Attending:        Patsi Sears      Secondary Phy.:   3rd Nursing- HR                    MD                                                             OB                                                             3rd Floor  Referred By:      Conan Bowens          Location:         [facility]                    MD ---------------------------------------------------------------------- Orders   #  Description  Code         Ordered By   1  [place] MFM OB LIMITED                    [id number]     Jaynie Collins   2  [place] MFM FETAL BPP WO NON              76819.01     Damain Broadus      STRESS  ----------------------------------------------------------------------   #  Order #                    Accession #                 Episode #   1   [id number]                  [id number]                  [id number]   2  [id number]                  [id number]                  [id number]  ---------------------------------------------------------------------- Indications   Premature rupture of membranes - leaking       O42.90   fluid   Hypertension - Chronic/Pre-existing (no        O10.019   meds)   Pre-existing diabetes, type 2, in pregnancy,   O24.112   second trimester (metformin, insulin, not   currently taking either)   [redacted] weeks gestation of pregnancy                Z3A.26  ---------------------------------------------------------------------- Fetal Evaluation  Num Of Fetuses:         1  Fetal Heart Rate(bpm):  142  Cardiac Activity:       Observed  Presentation:           Cephalic  Placenta:               Right lateral  Amniotic Fluid  AFI FV:      Subjectively decreased                              Largest Pocket(cm)                              3.51 ---------------------------------------------------------------------- Biophysical Evaluation  Amniotic F.V:   Pocket => 2 cm two         F. Tone:        Observed                  planes  F. Movement:    Observed                   Score:          8/8  F. Breathing:   Observed ---------------------------------------------------------------------- OB History  Gravidity:    3         Term:   2        Prem:   0        SAB:   0  TOP:          0       Ectopic:  0        Living: 2 ---------------------------------------------------------------------- Gestational Age  LMP:           29w 2d        Date:  10/07/17                 EDD:   07/14/18  Best:          Altamese Cabal 5d     Det. By:  Marcella Dubs         EDD:   08/01/18                                      (12/22/17) ---------------------------------------------------------------------- Anatomy  Stomach:               Appears normal, left   Bladder:                Appears normal                         sided  ---------------------------------------------------------------------- Cervix Uterus Adnexa  Cervix  Not visualized (advanced GA >24wks)  Uterus  No abnormality visualized.  Left Ovary  Not visualized.  Right Ovary  Not visualized.  Adnexa  No abnormality visualized. No adnexal mass  visualized. ---------------------------------------------------------------------- Comments  U/S images reviewed. Findings reviewed.  No evidence of  fetal compromise is found on BPP today.  No fetal  abnormalities are seen.   Recommendations: 1) Serial U/S every 4 weeks for fetal  growth  2) Twice weekly 3) Delivery @ 34 weeks ---------------------------------------------------------------------- Recommendations  1) Serial U/S every 4 weeks for fetal growth  2) Twice weekly  3) Delivery @ 34 weeks ----------------------------------------------------------------------               Patsi Sears, MD Electronically Signed Final Report   04/30/2018 10:41 am ----------------------------------------------------------------------  [place] Mfm Ob Limited  Result Date: 04/16/2018 ----------------------------------------------------------------------  OBSTETRICS REPORT                       (Signed Final 04/16/2018 05:00 pm) ---------------------------------------------------------------------- Patient Info  ID #:       [id number]                          D.O.B.:  1990/12/20 (27 yrs)  Name:       Kathryn Shelton                  Visit Date: 04/16/2018 07:32 am ---------------------------------------------------------------------- Performed By  Performed By:     Eden Lathe BS      Ref. Address:     [address]                    [address]T                                                             Rd  Attending:        Noralee Space MD        Secondary Phy.:   3rd Nursing- [facility]  OB                                                             3rd Floor  Referred By:      Ivory Broad DAVIS           Location:         [facility]                    MD ---------------------------------------------------------------------- Orders   #  Description                          Code         Ordered By   1  [place] MFM OB LIMITED                    19147.82     Jaynie Collins  ----------------------------------------------------------------------   #  Order #                    Accession #                 Episode #   1  [id number]                  [id number]                  [id number]  ---------------------------------------------------------------------- Indications   Hypertension - Chronic/Pre-existing (no        O10.019   meds)   Pre-existing diabetes, type 2, in pregnancy,   O24.112   second trimester (metformin, insulin, not   currently taking either)   Premature rupture of membranes - leaking       O42.90   fluid   [redacted] weeks gestation of pregnancy                Z3A.24  ---------------------------------------------------------------------- Fetal Evaluation  Num Of Fetuses:         1  Fetal Heart Rate(bpm):  161  Cardiac Activity:       Observed  Presentation:           Cephalic  Placenta:               Right lateral  P. Cord Insertion:      Previously Visualized  Amniotic Fluid  AFI FV:      Oligohydramnios                              Largest Pocket(cm)                              3.2 ---------------------------------------------------------------------- OB History  Gravidity:    3         Term:   2        Prem:   0        SAB:   0  TOP:          0       Ectopic:  0        Living: 2 ---------------------------------------------------------------------- Gestational Age  LMP:  27w 2d        Date:  10/07/17                 EDD:   07/14/18  Best:          24w 5d     Det. ByMarcella Dubs         EDD:   08/01/18                                      (12/22/17) ---------------------------------------------------------------------- Anatomy  Cranium:               Appears normal         Posterior  Fossa:        Appears normal  Cavum:                 Appears normal         Stomach:                Appears normal, left                                                                        sided  Ventricles:            Appears normal         Kidneys:                Appear normal  Choroid Plexus:        Appears normal         Bladder:                Appears normal  Cerebellum:            Appears normal ---------------------------------------------------------------------- Cervix Uterus Adnexa  Cervix  Not visualized (advanced GA >24wks)  Uterus  No abnormality visualized.  Left Ovary  Not visualized.  Right Ovary  Not visualized.  Cul De Sac  No free fluid seen.  Adnexa  No abnormality visualized. ---------------------------------------------------------------------- Impression  Patient is admitted with the diagnosis PPROM.  On ultrasound, amniotic fluid is decreased. Deepest vertical  pocket measures 3 cm. Good fetal activity is seen. ----------------------------------------------------------------------                  Noralee Space, MD Electronically Signed Final Report   04/16/2018 05:00 pm ----------------------------------------------------------------------    Medications:  Scheduled . aspirin EC  81 mg Oral Daily  . docusate sodium  100 mg Oral Daily  . Influenza vac split quadrivalent PF  0.5 mL Intramuscular Tomorrow-1000  . insulin aspart  0-16 Units Subcutaneous TID PC  . insulin aspart  12 Units Subcutaneous TID WC  . insulin glargine  17 Units Subcutaneous QAC breakfast  . insulin glargine  8 Units Subcutaneous QHS  . loratadine  10 mg Oral Daily  . prenatal multivitamin  1 tablet Oral Q1200   I have reviewed the patient's current medications.  ASSESSMENT: Patient Active Problem List   Diagnosis Date Noted  . Oligohydramnios antepartum, second trimester, other fetus 03/09/2018  . Obesity, morbid (HCC) 03/09/2018  . Preterm premature rupture of membranes, unspecified as to length  of time between rupture and onset of labor, second trimester 03/09/2018  . Supervision of high risk pregnancy, antepartum 12/31/2017  . Chronic hypertension during pregnancy, antepartum 12/31/2017  . Pre-existing type 2 diabetes mellitus during pregnancy, antepartum 12/31/2017  . Mixed hyperlipidemia 12/02/2017  . Type II diabetes mellitus, uncontrolled (HCC) 03/01/2017  . Essential hypertension 03/01/2017  . Intermittent palpitations 03/01/2017  . Panic attack 03/01/2017  . Obstructive sleep apnea syndrome 03/01/2017    PLAN: Continue insulin regimen and regular meals, increased meal time coverage to 12 units  Agreed with patient, only needs CBGs fasting and 2 hours postprandial, fetal monitoring bid unless there is any acute concern No need for twice weekly BPP, weekly BPPs with daily NSTs is sufficient for now. Continue inpatient care  Jaynie Collins, MD 05/05/2018,10:32 AM

## 2018-05-06 LAB — GLUCOSE, CAPILLARY
Glucose-Capillary: 106 mg/dL — ABNORMAL HIGH (ref 70–99)
Glucose-Capillary: 112 mg/dL — ABNORMAL HIGH (ref 70–99)
Glucose-Capillary: 137 mg/dL — ABNORMAL HIGH (ref 70–99)
Glucose-Capillary: 71 mg/dL (ref 70–99)

## 2018-05-06 MED ORDER — INSULIN ASPART 100 UNIT/ML ~~LOC~~ SOLN
0.0000 [IU] | Freq: Three times a day (TID) | SUBCUTANEOUS | Status: DC
  Administered 2018-05-06: 2 [IU] via SUBCUTANEOUS
  Administered 2018-05-07: 4 [IU] via SUBCUTANEOUS
  Administered 2018-05-07: 1 [IU] via SUBCUTANEOUS
  Administered 2018-05-08: 3 [IU] via SUBCUTANEOUS
  Administered 2018-05-08 (×2): 2 [IU] via SUBCUTANEOUS
  Administered 2018-05-09: 3 [IU] via SUBCUTANEOUS
  Administered 2018-05-09: 2 [IU] via SUBCUTANEOUS
  Administered 2018-05-10: 3 [IU] via SUBCUTANEOUS
  Administered 2018-05-10: 1 [IU] via SUBCUTANEOUS
  Administered 2018-05-11: 2 [IU] via SUBCUTANEOUS
  Administered 2018-05-11: 6 [IU] via SUBCUTANEOUS
  Administered 2018-05-11: 2 [IU] via SUBCUTANEOUS
  Administered 2018-05-12: 4 [IU] via SUBCUTANEOUS
  Administered 2018-05-12: 8 [IU] via SUBCUTANEOUS
  Administered 2018-05-12: 6 [IU] via SUBCUTANEOUS
  Administered 2018-05-12: 4 [IU] via SUBCUTANEOUS
  Administered 2018-05-13: 3 [IU] via SUBCUTANEOUS
  Administered 2018-05-13: 8 [IU] via SUBCUTANEOUS
  Administered 2018-05-13: 3 [IU] via SUBCUTANEOUS
  Administered 2018-05-14 – 2018-05-15 (×4): 2 [IU] via SUBCUTANEOUS
  Administered 2018-05-15: 8 [IU] via SUBCUTANEOUS
  Administered 2018-05-16: 3 [IU] via SUBCUTANEOUS
  Administered 2018-05-16: 1 [IU] via SUBCUTANEOUS
  Administered 2018-05-16: 3 [IU] via SUBCUTANEOUS
  Administered 2018-05-17: 2 [IU] via SUBCUTANEOUS
  Administered 2018-05-17: 3 [IU] via SUBCUTANEOUS
  Administered 2018-05-17: 2 [IU] via SUBCUTANEOUS
  Administered 2018-05-18: 4 [IU] via SUBCUTANEOUS
  Administered 2018-05-18: 2 [IU] via SUBCUTANEOUS

## 2018-05-06 NOTE — Progress Notes (Signed)
FACULTY PRACTICE ANTEPARTUM(COMPREHENSIVE) NOTE  [name] is a 27 y.o. G3P2002 at [redacted]w[redacted]d who is admitted for PPROM on 10/15, 2019 who opted for expectant management.  She is was admitted to the hospital at 24 weeks and remains stable condition. Fetal presentation is cephalic. Length of Stay:  34  Days  Subjective: No complaints today Patient reports the fetal movement as active. Patient reports uterine contraction  activity as none. Patient reports  vaginal bleeding as none. Patient describes fluid per vagina as minimal fluid 1 pad per day used.  Vitals:  Blood pressure 101/62, pulse (!) 115, temperature 99.5 F (37.5 C), temperature source Oral, resp. rate 18, height 5\' 6"  (1.676 m), weight 100.6 kg, last menstrual period 10/07/2017, SpO2 99 %. Physical Examination:  General appearance - alert, well appearing, and in no distress and overweight Heart - normal rate and regular rhythm Abdomen - soft, nontender, nondistended Fundal Height:  size equals dates Cervical Exam: Not evaluated.  Extremities: extremities normal, atraumatic, no cyanosis or edema and Homans sign is negative, no sign of DVT with DTRs 2+ bilaterally Membranes:intact, ruptured  Fetal Monitoring:  Baseline: 150 bpm, Variability: Moderate {> 6 bpm), Accelerations: Reactive and Decelerations: Absent  Labs:  Results for orders placed or performed during the hospital encounter of 04/02/18 (from the past 24 hour(s))  Glucose, capillary   Collection Time: 05/05/18  8:40 PM  Result Value Ref Range   Glucose-Capillary 149 (H) 70 - 99 mg/dL  Glucose, capillary   Collection Time: 05/06/18  9:29 AM  Result Value Ref Range   Glucose-Capillary 106 (H) 70 - 99 mg/dL  Glucose, capillary   Collection Time: 05/06/18 12:28 PM  Result Value Ref Range   Glucose-Capillary 112 (H) 70 - 99 mg/dL   CBC Latest Ref Rng & Units 05/05/2018 05/02/2018 04/29/2018  WBC 4.0 - 10.5 K/uL 6.5 6.3 4.7  Hemoglobin 12.0 - 15.0 g/dL 11.1(L)  10.5(L) 10.2(L)  Hematocrit 36.0 - 46.0 % 33.9(L) 31.6(L) 31.1(L)  Platelets 150 - 400 K/uL 325 302 271    Imaging Studies:    [place] Mfm Fetal Bpp Wo Non Stress  Result Date: 05/04/2018 ----------------------------------------------------------------------  OBSTETRICS REPORT                       (Signed Final 05/04/2018 03:42 pm) ---------------------------------------------------------------------- Patient Info  ID #:       [id number]                          D.O.B.:  07/01/1990 (27 yrs)  Name:       Kathryn Shelton                  Visit Date: 05/03/2018 05:33 pm ---------------------------------------------------------------------- Performed By  Performed By:     Emeline Darling BS,      Ref. Address:     [address]                    [place]                                                             Rd  Attending:        Lin Landsman      Secondary Phy.:  3rd Nursing- HR                    MD                                                             OB                                                             3rd Floor  Referred By:      Ivory Broad DAVIS          Location:         [facility]                    MD ---------------------------------------------------------------------- Orders   #  Description                          Code         Ordered By   1  [place] MFM FETAL BPP WO NON              76819.01     Janny Crute      STRESS  ----------------------------------------------------------------------   #  Order #                    Accession #                 Episode #   1  [id number]                  [id number]                  [id number]  ---------------------------------------------------------------------- Indications   Premature rupture of membranes - leaking       O42.90   fluid   Hypertension - Chronic/Pre-existing (no        O10.019   meds)   Pre-existing diabetes, type 2, in pregnancy,   O24.112   second trimester (metformin, insulin, not   currently taking either)   [redacted] weeks  gestation of pregnancy                Z3A.27  ---------------------------------------------------------------------- Fetal Evaluation  Num Of Fetuses:         1  Fetal Heart Rate(bpm):  138  Cardiac Activity:       Observed  Presentation:           Cephalic  Placenta:               Right lateral  P. Cord Insertion:      Previously Visualized  Amniotic Fluid  AFI FV:      Subjectively decreased                              Largest Pocket(cm)                              4.5 ---------------------------------------------------------------------- Biophysical  Evaluation  Amniotic F.V:   Pocket => 2 cm two         F. Tone:        Observed                  planes  F. Movement:    Observed                   Score:          8/8  F. Breathing:   Observed ---------------------------------------------------------------------- OB History  Gravidity:    3         Term:   2        Prem:   0        SAB:   0  TOP:          0       Ectopic:  0        Living: 2 ---------------------------------------------------------------------- Gestational Age  LMP:           29w 5d        Date:  10/07/17                 EDD:   07/14/18  Best:          27w 1d     Det. By:  Marcella Dubs         EDD:   08/01/18                                      (12/22/17) ---------------------------------------------------------------------- Impression  Biophysical profile  AFI subjectively low but normal MVP ---------------------------------------------------------------------- Recommendations  Management per inpatient.  Consider serial growth ----------------------------------------------------------------------               Lin Landsman, MD Electronically Signed Final Report   05/04/2018 03:42 pm ----------------------------------------------------------------------  [place] Mfm Fetal Bpp Wo Non Stress  Result Date: 04/30/2018 ----------------------------------------------------------------------  OBSTETRICS REPORT                       (Signed Final  04/30/2018 10:41 am) ---------------------------------------------------------------------- Patient Info  ID #:       [id number]                          D.O.B.:  [birth year] (27 yrs)  Name:       Kathryn Shelton                  Visit Date: 04/30/2018 07:27 am ---------------------------------------------------------------------- Performed By  Performed By:     Vivien Rota        Ref. Address:     [address]                    [place]                                                             Rd  Attending:        Patsi Sears      Secondary Phy.:   3rd Nursing- HR  MD                                                             OB                                                             3rd Floor  Referred By:      Ivory Broad DAVIS          Location:         [facility]                    MD ---------------------------------------------------------------------- Orders   #  Description                          Code         Ordered By   1  [place] MFM OB LIMITED                    76815.01     Marylouise Mallet   2  [place] MFM FETAL BPP WO NON              76819.01     Meeya Goldin      STRESS  ----------------------------------------------------------------------   #  Order #                    Accession #                 Episode #   1  [id number]                  [id number]                  [id number]   2  [id number]                  [id number]                  [id number]  ---------------------------------------------------------------------- Indications   Premature rupture of membranes - leaking       O42.90   fluid   Hypertension - Chronic/Pre-existing (no        O10.019   meds)   Pre-existing diabetes, type 2, in pregnancy,   O24.112   second trimester (metformin, insulin, not   currently taking either)   [redacted] weeks gestation of pregnancy                Z3A.26  ---------------------------------------------------------------------- Fetal Evaluation  Num Of Fetuses:         1  Fetal Heart Rate(bpm):   142  Cardiac Activity:       Observed  Presentation:           Cephalic  Placenta:               Right lateral  Amniotic Fluid  AFI FV:      Subjectively decreased  Largest Pocket(cm)                              3.51 ---------------------------------------------------------------------- Biophysical Evaluation  Amniotic F.V:   Pocket => 2 cm two         F. Tone:        Observed                  planes  F. Movement:    Observed                   Score:          8/8  F. Breathing:   Observed ---------------------------------------------------------------------- OB History  Gravidity:    3         Term:   2        Prem:   0        SAB:   0  TOP:          0       Ectopic:  0        Living: 2 ---------------------------------------------------------------------- Gestational Age  LMP:           29w 2d        Date:  10/07/17                 EDD:   07/14/18  Best:          Altamese Cabal 5d     Det. By:  Marcella Dubs         EDD:   08/01/18                                      (12/22/17) ---------------------------------------------------------------------- Anatomy  Stomach:               Appears normal, left   Bladder:                Appears normal                         sided ---------------------------------------------------------------------- Cervix Uterus Adnexa  Cervix  Not visualized (advanced GA >24wks)  Uterus  No abnormality visualized.  Left Ovary  Not visualized.  Right Ovary  Not visualized.  Adnexa  No abnormality visualized. No adnexal mass  visualized. ---------------------------------------------------------------------- Comments  U/S images reviewed. Findings reviewed.  No evidence of  fetal compromise is found on BPP today.  No fetal  abnormalities are seen.   Recommendations: 1) Serial U/S every 4 weeks for fetal  growth  2) Twice weekly 3) Delivery @ 34 weeks ---------------------------------------------------------------------- Recommendations  1) Serial U/S every 4 weeks for  fetal growth  2) Twice weekly  3) Delivery @ 34 weeks ----------------------------------------------------------------------               Patsi Sears, MD Electronically Signed Final Report   04/30/2018 10:41 am ----------------------------------------------------------------------  [place] Mfm Fetal Bpp Wo Non Stress  Result Date: 04/28/2018 ----------------------------------------------------------------------  OBSTETRICS REPORT                       (Signed Final 04/28/2018 02:53 pm) ---------------------------------------------------------------------- Patient Info  ID #:       [id number]  D.O.B.:  August 29, 1990 (27 yrs)  Name:       LAKEYTA VANDENHEUVEL                  Visit Date: 04/28/2018 01:59 pm ---------------------------------------------------------------------- Performed By  Performed By:     Earley Brooke     Ref. Address:     [address]                    [place], [place]                                                             Rd  Attending:        Patsi Sears      Secondary Phy.:   3rd Nursing- HR                    MD                                                             OB                                                             3rd Floor  Referred By:      Conan Bowens          Location:         [facility]                    MD ---------------------------------------------------------------------- Orders   #  Description                          Code         Ordered By   1  [place] MFM FETAL BPP WO NON              76819.01     KELLY DAVIS      STRESS  ----------------------------------------------------------------------   #  Order #                    Accession #                 Episode #   1  [id number]                  [id number]                  [id number]  ---------------------------------------------------------------------- Indications   Premature rupture of membranes - leaking       O42.90   fluid   [redacted] weeks gestation of pregnancy                 Z3A.26   Hypertension - Chronic/Pre-existing (no        O10.019   meds)   Pre-existing diabetes, type 2, in pregnancy,  O24.112   second trimester (metformin, insulin, not   currently taking either)  ---------------------------------------------------------------------- Fetal Evaluation  Num Of Fetuses:         1  Fetal Heart Rate(bpm):  142  Cardiac Activity:       Observed  Presentation:           Cephalic  Amniotic Fluid  AFI FV:      Subjectively decreased  AFI Sum(cm)     %Tile       Largest Pocket(cm)  6.72            < 3         3.54  RUQ(cm)       RLQ(cm)       LUQ(cm)        LLQ(cm)  3.54          1.04          0.91           1.23 ---------------------------------------------------------------------- Biophysical Evaluation  Amniotic F.V:   Pocket => 2 cm two         F. Tone:        Observed                  planes  F. Movement:    Observed                   Score:          8/8  F. Breathing:   Observed ---------------------------------------------------------------------- OB History  Gravidity:    3         Term:   2        Prem:   0        SAB:   0  TOP:          0       Ectopic:  0        Living: 2 ---------------------------------------------------------------------- Gestational Age  LMP:           29w 0d        Date:  10/07/17                 EDD:   07/14/18  Best:          Altamese Cabal 3d     Det. ByMarcella Dubs         EDD:   08/01/18                                      (12/22/17) ---------------------------------------------------------------------- Comments  U/S images reviewed. Findings reviewed with patient.   No  evidence of fetal compromise is found on BPP today.  No  fetal abnormalities are seen.  Mild oligohydramnios is present.   Questions answered.  Translator utilized.  10 minutes spent face to face with patient.  Recommendations: 1) Serial U/S every 4 weeks for fetal  growth  2) Twice weekly BPP  3) Delivery @ 34 weeks ----------------------------------------------------------------------  Recommendations   1) Serial U/S every 4 weeks for fetal growth  2) Twice weekly  BPP  3) Delivery @ 34 weeks ----------------------------------------------------------------------               Patsi Sears, MD Electronically Signed Final Report   04/28/2018 02:53 pm ----------------------------------------------------------------------  [place] Mfm Ob Follow Up  Result Date: 04/23/2018 ----------------------------------------------------------------------  OBSTETRICS REPORT                        (  Signed Final 04/23/2018 10:17 am) ---------------------------------------------------------------------- Patient Info  ID #:       [id number]                          D.O.B.:  27-Sep-1990 (27 yrs)  Name:       Kathryn Shelton                  Visit Date: 04/23/2018 08:03 am ---------------------------------------------------------------------- Performed By  Performed By:     Birdena Crandall        Ref. Address:      [address]                    [place]                                                              Rd  Attending:        Lin Landsman      Secondary Phy.:    3rd Nursing- HR                    MD                                                              OB                                                              3rd Floor  Referred By:      Conan Bowens          Location:          [facility]                    MD ---------------------------------------------------------------------- Orders   #  Description                          Code         Ordered By   1  [place] MFM OB FOLLOW UP                  78295.62     KELLY DAVIS  ----------------------------------------------------------------------   #  Order #                    Accession #                 Episode #   1  [id number]                  [id number]                  [id number]  ---------------------------------------------------------------------- Indications   Premature rupture of membranes - leaking       O42.90   fluid  Hypertension - Chronic/Pre-existing (no        O10.019   meds)   Pre-existing diabetes, type 2, in pregnancy,   O24.112   second trimester (metformin, insulin, not   currently taking either)   [redacted] weeks gestation of pregnancy                Z3A.25  ---------------------------------------------------------------------- Fetal Evaluation  Num Of Fetuses:          1  Fetal Heart Rate(bpm):   131  Cardiac Activity:        Observed  Presentation:            Cephalic  Placenta:                Right lateral  Amniotic Fluid  AFI FV:      Oligohydramnios                              Largest Pocket(cm)                              3.08 ---------------------------------------------------------------------- Biometry  BPD:      64.2  mm     G. Age:  26w 0d         49  %    CI:        70.73   %    70 - 86                                                          FL/HC:       18.6  %    18.6 - 20.4  HC:      243.3  mm     G. Age:  26w 3d         51  %    HC/AC:       1.10       1.04 - 1.22  AC:      220.6  mm     G. Age:  26w 3d         66  %    FL/BPD:      70.4  %    71 - 87  FL:       45.2  mm     G. Age:  25w 0d         17  %    FL/AC:       20.5  %    20 - 24  HUM:      41.6  mm     G. Age:  25w 1d         29  %  LV:        7.6  mm  Est. FW:     868   gm   1 lb 15 oz      58  % ---------------------------------------------------------------------- OB History  Gravidity:    3         Term:   2        Prem:   0        SAB:   0  TOP:          0  Ectopic:  0        Living: 2 ---------------------------------------------------------------------- Gestational Age  LMP:           28w 2d        Date:  10/07/17                 EDD:   07/14/18  U/S Today:     26w 0d                                        EDD:   07/30/18  Best:          25w 5d     Det. ByMarcella Dubs         EDD:   08/01/18                                      (12/22/17) ---------------------------------------------------------------------- Anatomy  Ventricles:             Appears normal         Stomach:                Appears normal, left                                                                        sided  Cerebellum:            Appears normal         Kidneys:                Appear normal  Posterior Fossa:       Appears normal         Bladder:                Appears normal ---------------------------------------------------------------------- Cervix Uterus Adnexa  Cervix  Length:           3.84  cm.  Normal appearance by transabdominal scan. ---------------------------------------------------------------------- Impression  PPROM  Inpatient  MVP 3 however subjectively low fluid. ---------------------------------------------------------------------- Recommendations  Follow up per inpatient team. ----------------------------------------------------------------------               Lin Landsman, MD Electronically Signed Final Report   04/23/2018 10:17 am ----------------------------------------------------------------------  [place] Mfm Ob Limited  Result Date: 04/30/2018 ----------------------------------------------------------------------  OBSTETRICS REPORT                       (Signed Final 04/30/2018 10:41 am) ---------------------------------------------------------------------- Patient Info  ID #:       [id number]                          D.O.B.:  1991/01/11 (27 yrs)  Name:       Kathryn Shelton                  Visit Date: 04/30/2018 07:27 am ---------------------------------------------------------------------- Performed By  Performed By:     Vivien Rota        Ref. Address:     [address]  [place]                                                             Rd  Attending:        Patsi Sears      Secondary Phy.:   3rd Nursing- HR                    MD                                                             OB                                                             3rd Floor  Referred By:      Ivory Broad DAVIS           Location:         [facility]                    MD ---------------------------------------------------------------------- Orders   #  Description                          Code         Ordered By   1  [place] MFM OB LIMITED                    76815.01     Cosima Prentiss   2  [place] MFM FETAL BPP WO NON              76819.01     Josel Keo      STRESS  ----------------------------------------------------------------------   #  Order #                    Accession #                 Episode #   1  [id number]                  [id number]   2  [id number]                  [id number]                  [id number]  ---------------------------------------------------------------------- Indications   Premature rupture of membranes - leaking       O42.90   fluid   Hypertension - Chronic/Pre-existing (no        O10.019   meds)   Pre-existing diabetes, type 2, in pregnancy,   O24.112   second trimester (metformin, insulin, not   currently taking either)   [redacted] weeks gestation of pregnancy  Z3A.26  ---------------------------------------------------------------------- Fetal Evaluation  Num Of Fetuses:         1  Fetal Heart Rate(bpm):  142  Cardiac Activity:       Observed  Presentation:           Cephalic  Placenta:               Right lateral  Amniotic Fluid  AFI FV:      Subjectively decreased                              Largest Pocket(cm)                              3.51 ---------------------------------------------------------------------- Biophysical Evaluation  Amniotic F.V:   Pocket => 2 cm two         F. Tone:        Observed                  planes  F. Movement:    Observed                   Score:          8/8  F. Breathing:   Observed ---------------------------------------------------------------------- OB History  Gravidity:    3         Term:   2        Prem:   0        SAB:   0  TOP:          0       Ectopic:  0        Living: 2  ---------------------------------------------------------------------- Gestational Age  LMP:           29w 2d        Date:  10/07/17                 EDD:   07/14/18  Best:          Altamese Cabal 5d     Det. By:  Marcella Dubs         EDD:   08/01/18                                      (12/22/17) ---------------------------------------------------------------------- Anatomy  Stomach:               Appears normal, left   Bladder:                Appears normal                         sided ---------------------------------------------------------------------- Cervix Uterus Adnexa  Cervix  Not visualized (advanced GA >24wks)  Uterus  No abnormality visualized.  Left Ovary  Not visualized.  Right Ovary  Not visualized.  Adnexa  No abnormality visualized. No adnexal mass  visualized. ---------------------------------------------------------------------- Comments  U/S images reviewed. Findings reviewed.  No evidence of  fetal compromise is found on BPP today.  No fetal  abnormalities are seen.   Recommendations: 1) Serial U/S every 4 weeks for fetal  growth  2) Twice weekly 3) Delivery @ 34 weeks ---------------------------------------------------------------------- Recommendations  1) Serial U/S every 4 weeks for fetal growth  2) Twice weekly  3) Delivery @ 34  weeks ----------------------------------------------------------------------               Patsi Searsobert L Jacobson, MD Electronically Signed Final Report   04/30/2018 10:41 am ----------------------------------------------------------------------  [place] Mfm Ob Limited  Result Date: 04/16/2018 ----------------------------------------------------------------------  OBSTETRICS REPORT                       (Signed Final 04/16/2018 05:00 pm) ---------------------------------------------------------------------- Patient Info  ID #:       [id number]                          D.O.B.:  1991-04-06 (27 yrs)  Name:       Kathryn SerAVIA Nordhoff                  Visit Date: 04/16/2018 07:32 am  ---------------------------------------------------------------------- Performed By  Performed By:     Eden Lathearrie Stalter BS      [address]     [address]                    [place]                                                             [place]  Attending:        Noralee Spaceavi Shankar MD        Secondary Phy.:   3rd Nursing- HR                                                             OB                                                             3rd Floor  Referred By:      Conan BowensKELLY M DAVIS          Location:         [facility] [facility]                    MD ---------------------------------------------------------------------- Orders   #  Description                          Code         Ordered By   1  US MFM OB LIMITED                    [id number]     Jaynie CollinsUGONNA Markham Dumlao  ----------------------------------------------------------------------   #  Order #                    Accession #                 Episode #   1  [id number]  [id number]                  [id number]  ---------------------------------------------------------------------- Indications   Hypertension - Chronic/Pre-existing (no        O10.019   meds)   Pre-existing diabetes, type 2, in pregnancy,   O24.112   second trimester (metformin, insulin, not   currently taking either)   Premature rupture of membranes - leaking       O42.90   fluid   [redacted] weeks gestation of pregnancy                Z3A.24  ---------------------------------------------------------------------- Fetal Evaluation  Num Of Fetuses:         1  Fetal Heart Rate(bpm):  161  Cardiac Activity:       Observed  Presentation:           Cephalic  Placenta:               Right lateral  P. Cord Insertion:      Previously Visualized  Amniotic Fluid  AFI FV:      Oligohydramnios                              Largest Pocket(cm)                              3.2 ---------------------------------------------------------------------- OB History  Gravidity:    3         Term:   2        Prem:   0         SAB:   0  TOP:          0       Ectopic:  0        Living: 2 ---------------------------------------------------------------------- Gestational Age  LMP:           27w 2d        Date:  10/07/17                 EDD:   07/14/18  Best:          24w 5d     Det. By:  Marcella Dubs         EDD:   08/01/18                                      (12/22/17) ---------------------------------------------------------------------- Anatomy  Cranium:               Appears normal         Posterior Fossa:        Appears normal  Cavum:                 Appears normal         Stomach:                Appears normal, left  sided  Ventricles:            Appears normal         Kidneys:                Appear normal  Choroid Plexus:        Appears normal         Bladder:                Appears normal  Cerebellum:            Appears normal ---------------------------------------------------------------------- Cervix Uterus Adnexa  Cervix  Not visualized (advanced GA >24wks)  Uterus  No abnormality visualized.  Left Ovary  Not visualized.  Right Ovary  Not visualized.  Cul De Sac  No free fluid seen.  Adnexa  No abnormality visualized. ---------------------------------------------------------------------- Impression  Patient is admitted with the diagnosis PPROM.  On ultrasound, amniotic fluid is decreased. Deepest vertical  pocket measures 3 cm. Good fetal activity is seen. ----------------------------------------------------------------------                  Noralee Space, MD Electronically Signed Final Report   04/16/2018 05:00 pm ----------------------------------------------------------------------    Medications:  Scheduled . aspirin EC  81 mg Oral Daily  . docusate sodium  100 mg Oral Daily  . Influenza vac split quadrivalent PF  0.5 mL Intramuscular Tomorrow-1000  . insulin aspart  0-14 Units Subcutaneous TID PC  . insulin aspart  12 Units Subcutaneous TID  WC  . insulin glargine  17 Units Subcutaneous QAC breakfast  . insulin glargine  8 Units Subcutaneous QHS  . loratadine  10 mg Oral Daily  . prenatal multivitamin  1 tablet Oral Q1200   I have reviewed the patient's current medications.  ASSESSMENT: Patient Active Problem List   Diagnosis Date Noted  . Oligohydramnios antepartum, second trimester, other fetus 03/09/2018  . Obesity, morbid (HCC) 03/09/2018  . Preterm premature rupture of membranes, unspecified as to length of time between rupture and onset of labor, second trimester 03/09/2018  . Supervision of high risk pregnancy, antepartum 12/31/2017  . Chronic hypertension during pregnancy, antepartum 12/31/2017  . Pre-existing type 2 diabetes mellitus during pregnancy, antepartum 12/31/2017  . Mixed hyperlipidemia 12/02/2017  . Type II diabetes mellitus, uncontrolled (HCC) 03/01/2017  . Essential hypertension 03/01/2017  . Intermittent palpitations 03/01/2017  . Panic attack 03/01/2017  . Obstructive sleep apnea syndrome 03/01/2017    PLAN: Continue insulin regimen and regular meals,appreicate input from Inpatient Diabetic Coordinator and chose the lower SSI Continue CBGs fasting and 2 hours postprandial, fetal monitoring bid unless there is any acute concern Continue weekly BPPs with BID NSTs Continue inpatient care  Jaynie Collins, MD 05/06/2018,12:51 PM

## 2018-05-07 ENCOUNTER — Inpatient Hospital Stay (HOSPITAL_BASED_OUTPATIENT_CLINIC_OR_DEPARTMENT_OTHER): Payer: Medicaid Other

## 2018-05-07 DIAGNOSIS — O42912 Preterm premature rupture of membranes, unspecified as to length of time between rupture and onset of labor, second trimester: Secondary | ICD-10-CM

## 2018-05-07 DIAGNOSIS — O24112 Pre-existing diabetes mellitus, type 2, in pregnancy, second trimester: Secondary | ICD-10-CM

## 2018-05-07 DIAGNOSIS — O10012 Pre-existing essential hypertension complicating pregnancy, second trimester: Secondary | ICD-10-CM

## 2018-05-07 DIAGNOSIS — Z3A27 27 weeks gestation of pregnancy: Secondary | ICD-10-CM

## 2018-05-07 LAB — GLUCOSE, CAPILLARY
GLUCOSE-CAPILLARY: 112 mg/dL — AB (ref 70–99)
Glucose-Capillary: 139 mg/dL — ABNORMAL HIGH (ref 70–99)
Glucose-Capillary: 89 mg/dL (ref 70–99)
Glucose-Capillary: 110 mg/dL — ABNORMAL HIGH (ref 70–99)
Glucose-Capillary: 219 mg/dL — ABNORMAL HIGH (ref 70–99)

## 2018-05-07 NOTE — Progress Notes (Signed)
FACULTY PRACTICE ANTEPARTUM(COMPREHENSIVE) NOTE  Kathryn Shelton is a 27 y.o. G3P2002 at [redacted]w[redacted]d who is admitted for PPROM on 10/15, 2019 who opted for expectant management.  She is was admitted to the hospital at 24 weeks.  Fetal presentation is cephalic.  Length of Stay:  35  Days  Subjective: No complaints today. Patient reports the fetal movement as active. Patient reports uterine contraction  activity as none. Patient reports  vaginal bleeding as none. Patient describes fluid per vagina as minimal fluid 1 pad per day used.  Vitals:  Blood pressure 102/82, pulse 97, temperature 98.1 F (36.7 C), temperature source Oral, resp. rate 16, height 5\' 6"  (1.676 m), weight 100.6 kg, last menstrual period 10/07/2017, SpO2 100 %. Physical Examination:  General appearance - alert, well appearing, and in no distress and overweight Heart - normal rate and regular rhythm Abdomen - soft, nontender, nondistended Fundal Height:  size equals dates Cervical Exam: Not evaluated.  Extremities: extremities normal, atraumatic, no cyanosis or edema and Homans sign is negative, no sign of DVT with DTRs 2+ bilaterally Membranes:intact, ruptured  Fetal Monitoring:  Baseline: 150 bpm, Variability: Moderate {> 6 bpm), Accelerations: Reactive and Decelerations: Absent  Labs:  Results for orders placed or performed during the hospital encounter of 04/02/18 (from the past 24 hour(s))  Glucose, capillary   Collection Time: 05/06/18 12:28 PM  Result Value Ref Range   Glucose-Capillary 112 (H) 70 - 99 mg/dL  Glucose, capillary   Collection Time: 05/06/18  1:42 PM  Result Value Ref Range   Glucose-Capillary 137 (H) 70 - 99 mg/dL  Glucose, capillary   Collection Time: 05/06/18  5:46 PM  Result Value Ref Range   Glucose-Capillary 71 70 - 99 mg/dL  Glucose, capillary   Collection Time: 05/06/18 10:36 PM  Result Value Ref Range   Glucose-Capillary 139 (H) 70 - 99 mg/dL  Glucose, capillary   Collection Time:  05/07/18  9:11 AM  Result Value Ref Range   Glucose-Capillary 112 (H) 70 - 99 mg/dL  Glucose, capillary   Collection Time: 05/07/18 11:21 AM  Result Value Ref Range   Glucose-Capillary 89 70 - 99 mg/dL   CBC Latest Ref Rng & Units 05/05/2018 05/02/2018 04/29/2018  WBC 4.0 - 10.5 K/uL 6.5 6.3 4.7  Hemoglobin 12.0 - 15.0 g/dL 11.1(L) 10.5(L) 10.2(L)  Hematocrit 36.0 - 46.0 % 33.9(L) 31.6(L) 31.1(L)  Platelets 150 - 400 K/uL 325 302 271    Imaging Studies:    [place] Mfm Fetal Bpp Wo Non Stress  Result Date: 05/07/2018 ----------------------------------------------------------------------  OBSTETRICS REPORT                       (Signed Final 05/07/2018 08:43 am) ---------------------------------------------------------------------- Patient Info  ID #:       [id number]                          D.O.B.:  [birth year] (27 yrs)  Name:       Kathryn Shelton                  Visit Date: 05/07/2018 07:09 am ---------------------------------------------------------------------- Performed By  Performed By:     Lenise Arena        Ref. Address:     [address]                    [place]  Rd  Attending:        Blase Mess MD       Secondary Phy.:   3rd Nursing- HR                                                             OB                                                             3rd Floor  Referred By:      Conan Bowens          Location:         [facility]                    MD ---------------------------------------------------------------------- Orders   #  Description                          Code         Ordered By   1  [place] MFM FETAL BPP WO NON              76819.01     Haider Hornaday      STRESS  ----------------------------------------------------------------------   #  Order #                    Accession #                 Episode #   1  [id number]                  [id number]                  [id number]   ---------------------------------------------------------------------- Indications   Premature rupture of membranes - leaking       O42.90   fluid   Hypertension - Chronic/Pre-existing (no        O10.019   meds)   Pre-existing diabetes, type 2, in pregnancy,   O24.112   second trimester (metformin, insulin, not   currently taking either)   [redacted] weeks gestation of pregnancy                Z3A.27  ---------------------------------------------------------------------- Vital Signs  Weight (lb): 221                               Height:        5'6"  BMI:         35.67 ---------------------------------------------------------------------- Fetal Evaluation  Num Of Fetuses:         1  Fetal Heart Rate(bpm):  144  Cardiac Activity:       Observed  Presentation:           Cephalic  Placenta:               Right lateral  Amniotic Fluid  AFI FV:      Subjectively decreased  AFI Sum(cm)     %Tile  Largest Pocket(cm)  5.71            < 3         3.65  RUQ(cm)       RLQ(cm)       LUQ(cm)        LLQ(cm)  3.65          0             0              2.06 ---------------------------------------------------------------------- Biophysical Evaluation  Amniotic F.V:   Pocket => 2 cm two         F. Tone:        Observed                  planes  F. Movement:    Observed                   Score:          8/8  F. Breathing:   Observed ---------------------------------------------------------------------- OB History  Gravidity:    3         Term:   2        Prem:   0        SAB:   0  TOP:          0       Ectopic:  0        Living: 2 ---------------------------------------------------------------------- Gestational Age  LMP:           30w 2d        Date:  10/07/17                 EDD:   07/14/18  Best:          27w 5d     Det. ByMarcella Dubs         EDD:   08/01/18                                      (12/22/17) ---------------------------------------------------------------------- Anatomy  Thoracic:              Appears normal          Kidneys:                Appear normal  Stomach:               Appears normal, left   Bladder:                Appears normal                         sided  Abdomen:               Appears normal ---------------------------------------------------------------------- Cervix Uterus Adnexa  Cervix  Not visualized (advanced GA >24wks) ---------------------------------------------------------------------- Impression  Live singleton preterm gestation.  Pregnancy complicated by prelabor preterm rupture of  membrane. ---------------------------------------------------------------------- Recommendations  Follow up as clinically indicated and or scheduled. ----------------------------------------------------------------------                 Blase Mess, MD Electronically Signed Final Report   05/07/2018 08:43 am ----------------------------------------------------------------------  [place] Mfm Fetal Bpp Wo Non Stress  Result Date: 05/04/2018 ----------------------------------------------------------------------  OBSTETRICS REPORT                       (  Signed Final 05/04/2018 03:42 pm) ---------------------------------------------------------------------- Patient Info  ID #:       [id number]                          D.O.B.:  [birth year] (27 yrs)  Name:       Kathryn Shelton                  Visit Date: 05/03/2018 05:33 pm ---------------------------------------------------------------------- Performed By  Performed By:     Emeline Darling BS,      Ref. Address:     [address]                    [place]                                                             Rd  Attending:        Lin Landsman      Secondary Phy.:   3rd Nursing- HR                    MD                                                             OB                                                             3rd Floor  Referred By:      Conan Bowens          Location:         [facility]                    MD  ---------------------------------------------------------------------- Orders   #  Description                          Code         Ordered By   1  [place] MFM FETAL BPP WO NON              40981.19     Tehila Sokolow      STRESS  ----------------------------------------------------------------------   #  Order #                    Accession #                 Episode #   1  [id number]                  [id number]                  [id number]  ---------------------------------------------------------------------- Indications   Premature rupture of membranes - leaking       O42.90   fluid   Hypertension - Chronic/Pre-existing (  no        O10.019   meds)   Pre-existing diabetes, type 2, in pregnancy,   O24.112   second trimester (metformin, insulin, not   currently taking either)   [redacted] weeks gestation of pregnancy                Z3A.27  ---------------------------------------------------------------------- Fetal Evaluation  Num Of Fetuses:         1  Fetal Heart Rate(bpm):  138  Cardiac Activity:       Observed  Presentation:           Cephalic  Placenta:               Right lateral  P. Cord Insertion:      Previously Visualized  Amniotic Fluid  AFI FV:      Subjectively decreased                              Largest Pocket(cm)                              4.5 ---------------------------------------------------------------------- Biophysical Evaluation  Amniotic F.V:   Pocket => 2 cm two         F. Tone:        Observed                  planes  F. Movement:    Observed                   Score:          8/8  F. Breathing:   Observed ---------------------------------------------------------------------- OB History  Gravidity:    3         Term:   2        Prem:   0        SAB:   0  TOP:          0       Ectopic:  0        Living: 2 ---------------------------------------------------------------------- Gestational Age  LMP:           29w 5d        Date:  10/07/17                 EDD:   07/14/18  Best:          27w 1d     Det. By:   Marcella DubsEarly Ultrasound         EDD:   08/01/18                                      (12/22/17) ---------------------------------------------------------------------- Impression  Biophysical profile  AFI subjectively low but normal MVP ---------------------------------------------------------------------- Recommendations  Management per inpatient.  Consider serial growth ----------------------------------------------------------------------               Lin Landsmanorenthian Booker, MD Electronically Signed Final Report   05/04/2018 03:42 pm ----------------------------------------------------------------------  [place] Mfm Fetal Bpp Wo Non Stress  Result Date: 04/30/2018 ----------------------------------------------------------------------  OBSTETRICS REPORT                       (Signed Final 04/30/2018 10:41 am) ---------------------------------------------------------------------- Patient Info  ID #:       [id number]  D.O.B.:  09/21/1990 (27 yrs)  Name:       JAZIYA OBARR                  Visit Date: 04/30/2018 07:27 am ---------------------------------------------------------------------- Performed By  Performed By:     Vivien Rota        Ref. Address:     [address]                    [place]                                                             Rd  Attending:        Patsi Sears      Secondary Phy.:   3rd Nursing- HR                    MD                                                             OB                                                             3rd Floor  Referred By:      Conan Bowens          Location:         [facility]                    MD ---------------------------------------------------------------------- Orders   #  Description                          Code         Ordered By   1  [place] MFM OB LIMITED                    76815.01     Nezar Buckles   2  [place] MFM FETAL BPP WO NON              76819.01     Glendel Jaggers      STRESS   ----------------------------------------------------------------------   #  Order #                    Accession #                 Episode #   1  [id number]                  [id number]                  [id number]   2  [id number]                  [id number]  [id number]  ---------------------------------------------------------------------- Indications   Premature rupture of membranes - leaking       O42.90   fluid   Hypertension - Chronic/Pre-existing (no        O10.019   meds)   Pre-existing diabetes, type 2, in pregnancy,   O24.112   second trimester (metformin, insulin, not   currently taking either)   [redacted] weeks gestation of pregnancy                Z3A.26  ---------------------------------------------------------------------- Fetal Evaluation  Num Of Fetuses:         1  Fetal Heart Rate(bpm):  142  Cardiac Activity:       Observed  Presentation:           Cephalic  Placenta:               Right lateral  Amniotic Fluid  AFI FV:      Subjectively decreased                              Largest Pocket(cm)                              3.51 ---------------------------------------------------------------------- Biophysical Evaluation  Amniotic F.V:   Pocket => 2 cm two         F. Tone:        Observed                  planes  F. Movement:    Observed                   Score:          8/8  F. Breathing:   Observed ---------------------------------------------------------------------- OB History  Gravidity:    3         Term:   2        Prem:   0        SAB:   0  TOP:          0       Ectopic:  0        Living: 2 ---------------------------------------------------------------------- Gestational Age  LMP:           29w 2d        Date:  10/07/17                 EDD:   07/14/18  Best:          Altamese Cabal 5d     Det. By:  Marcella Dubs         EDD:   08/01/18                                      (12/22/17) ---------------------------------------------------------------------- Anatomy  Stomach:               Appears  normal, left   Bladder:                Appears normal                         sided ---------------------------------------------------------------------- Cervix Uterus Adnexa  Cervix  Not visualized (advanced GA >24wks)  Uterus  No abnormality visualized.  Left Ovary  Not visualized.  Right Ovary  Not visualized.  Adnexa  No abnormality visualized. No adnexal mass  visualized. ---------------------------------------------------------------------- Comments  U/S images reviewed. Findings reviewed.  No evidence of  fetal compromise is found on BPP today.  No fetal  abnormalities are seen.   Recommendations: 1) Serial U/S every 4 weeks for fetal  growth  2) Twice weekly 3) Delivery @ 34 weeks ---------------------------------------------------------------------- Recommendations  1) Serial U/S every 4 weeks for fetal growth  2) Twice weekly  3) Delivery @ 34 weeks ----------------------------------------------------------------------               Patsi Sears, MD Electronically Signed Final Report   04/30/2018 10:41 am ----------------------------------------------------------------------  [place] Mfm Fetal Bpp Wo Non Stress  Result Date: 04/28/2018 ----------------------------------------------------------------------  OBSTETRICS REPORT                       (Signed Final 04/28/2018 02:53 pm) ---------------------------------------------------------------------- Patient Info  ID #:       [id number]                          D.O.B.:  1990/09/11 (27 yrs)  Name:       Kathryn Shelton                  Visit Date: 04/28/2018 01:59 pm ---------------------------------------------------------------------- Performed By  Performed By:     Earley Brooke     Ref. Address:     [address]                    [place], [place]                                                             Rd  Attending:        Patsi Sears      Secondary Phy.:   3rd Nursing- HR                    MD                                                              OB                                                             3rd Floor  Referred By:      Conan Bowens          Location:         [facility]                    MD ---------------------------------------------------------------------- Orders   #  Description                          Code         Ordered  By   1  [place] MFM FETAL BPP WO NON              76819.01     KELLY DAVIS      STRESS  ----------------------------------------------------------------------   #  Order #                    Accession #                 Episode #   1  [id number]                  [id number]                  [id number]  ---------------------------------------------------------------------- Indications   Premature rupture of membranes - leaking       O42.90   fluid   [redacted] weeks gestation of pregnancy                Z3A.26   Hypertension - Chronic/Pre-existing (no        O10.019   meds)   Pre-existing diabetes, type 2, in pregnancy,   O24.112   second trimester (metformin, insulin, not   currently taking either)  ---------------------------------------------------------------------- Fetal Evaluation  Num Of Fetuses:         1  Fetal Heart Rate(bpm):  142  Cardiac Activity:       Observed  Presentation:           Cephalic  Amniotic Fluid  AFI FV:      Subjectively decreased  AFI Sum(cm)     %Tile       Largest Pocket(cm)  6.72            < 3         3.54  RUQ(cm)       RLQ(cm)       LUQ(cm)        LLQ(cm)  3.54          1.04          0.91           1.23 ---------------------------------------------------------------------- Biophysical Evaluation  Amniotic F.V:   Pocket => 2 cm two         F. Tone:        Observed                  planes  F. Movement:    Observed                   Score:          8/8  F. Breathing:   Observed ---------------------------------------------------------------------- OB History  Gravidity:    3         Term:   2        Prem:   0        SAB:   0  TOP:          0       Ectopic:  0        Living: 2  ---------------------------------------------------------------------- Gestational Age  LMP:           29w 0d        Date:  10/07/17                 EDD:   07/14/18  Best:          [place] Cabal 3d     Det. By:  Marcella Dubs  EDD:   08/01/18                                      (12/22/17) ---------------------------------------------------------------------- Comments  U/S images reviewed. Findings reviewed with patient.   No  evidence of fetal compromise is found on BPP today.  No  fetal abnormalities are seen.  Mild oligohydramnios is present.   Questions answered.  Translator utilized.  10 minutes spent face to face with patient.  Recommendations: 1) Serial U/S every 4 weeks for fetal  growth  2) Twice weekly BPP  3) Delivery @ 34 weeks ---------------------------------------------------------------------- Recommendations   1) Serial U/S every 4 weeks for fetal growth  2) Twice weekly  BPP  3) Delivery @ 34 weeks ----------------------------------------------------------------------               Patsi Sears, MD Electronically Signed Final Report   04/28/2018 02:53 pm ----------------------------------------------------------------------  [place] Mfm Ob Follow Up  Result Date: 04/23/2018 ----------------------------------------------------------------------  OBSTETRICS REPORT                        (Signed Final 04/23/2018 10:17 am) ---------------------------------------------------------------------- Patient Info  ID #:       [id number]                          D.O.B.:  Aug 16, 1990 (27 yrs)  Name:       Kathryn Shelton                  Visit Date: 04/23/2018 08:03 am ---------------------------------------------------------------------- Performed By  Performed By:     Birdena Crandall        Ref. Address:      [address]                    [place]                                                              Rd  Attending:        Lin Landsman      Secondary Phy.:    3rd Nursing- HR                     MD                                                              OB                                                              3rd Floor  Referred By:      Ivory Broad DAVIS          Location:          [facility]  MD ---------------------------------------------------------------------- Orders   #  Description                          Code         Ordered By   1  [place] MFM OB FOLLOW UP                  40981.19     KELLY DAVIS  ----------------------------------------------------------------------   #  Order #                    Accession #                 Episode #   1  [id number]                  [id number]                  [id number]  ---------------------------------------------------------------------- Indications   Premature rupture of membranes - leaking       O42.90   fluid   Hypertension - Chronic/Pre-existing (no        O10.019   meds)   Pre-existing diabetes, type 2, in pregnancy,   O24.112   second trimester (metformin, insulin, not   currently taking either)   [redacted] weeks gestation of pregnancy                Z3A.25  ---------------------------------------------------------------------- Fetal Evaluation  Num Of Fetuses:          1  Fetal Heart Rate(bpm):   131  Cardiac Activity:        Observed  Presentation:            Cephalic  Placenta:                Right lateral  Amniotic Fluid  AFI FV:      Oligohydramnios                              Largest Pocket(cm)                              3.08 ---------------------------------------------------------------------- Biometry  BPD:      64.2  mm     G. Age:  26w 0d         49  %    CI:        70.73   %    70 - 86                                                          FL/HC:       18.6  %    18.6 - 20.4  HC:      243.3  mm     G. Age:  26w 3d         51  %    HC/AC:       1.10       1.04 - 1.22  AC:      220.6  mm     G. Age:  26w 3d         66  %  FL/BPD:      70.4  %    71 - 87  FL:       45.2  mm     G. Age:  25w 0d         17  %     FL/AC:       20.5  %    20 - 24  HUM:      41.6  mm     G. Age:  25w 1d         29  %  LV:        7.6  mm  Est. FW:     868   gm   1 lb 15 oz      58  % ---------------------------------------------------------------------- OB History  Gravidity:    3         Term:   2        Prem:   0        SAB:   0  TOP:          0       Ectopic:  0        Living: 2 ---------------------------------------------------------------------- Gestational Age  LMP:           28w 2d        Date:  10/07/17                 EDD:   07/14/18  U/S Today:     26w 0d                                        EDD:   07/30/18  Best:          25w 5d     Det. ByMarcella Dubs         EDD:   08/01/18                                      (12/22/17) ---------------------------------------------------------------------- Anatomy  Ventricles:            Appears normal         Stomach:                Appears normal, left                                                                        sided  Cerebellum:            Appears normal         Kidneys:                Appear normal  Posterior Fossa:       Appears normal         Bladder:                Appears normal ---------------------------------------------------------------------- Cervix Uterus Adnexa  Cervix  Length:           3.84  cm.  Normal appearance by transabdominal scan. ---------------------------------------------------------------------- Impression  PPROM  Inpatient  MVP 3 however subjectively low fluid. ---------------------------------------------------------------------- Recommendations  Follow up per inpatient team. ----------------------------------------------------------------------               Lin Landsman, MD Electronically Signed Final Report   04/23/2018 10:17 am ----------------------------------------------------------------------  [place] Mfm Ob Limited  Result Date: 04/30/2018 ----------------------------------------------------------------------  OBSTETRICS REPORT                        (Signed Final 04/30/2018 10:41 am) ---------------------------------------------------------------------- Patient Info  ID #:       [id number]                          D.O.B.:  1991/04/16 (27 yrs)  Name:       Kathryn Shelton                  Visit Date: 04/30/2018 07:27 am ---------------------------------------------------------------------- Performed By  Performed By:     Vivien Rota        Ref. Address:     [address]                    [place]                                                             Rd  Attending:        Patsi Sears      Secondary Phy.:   3rd Nursing- HR                    MD                                                             OB                                                             3rd Floor  Referred By:      Conan Bowens          Location:         [facility]                    MD ---------------------------------------------------------------------- Orders   #  Description                          Code         Ordered By   1  [place] MFM OB LIMITED                    76815.01     Trooper Olander   2  [place] MFM FETAL BPP WO NON              76819.01     Chantil Bari  STRESS  ----------------------------------------------------------------------   #  Order #                    Accession #                 Episode #   1  [id number]                  [id number]   2  [id number]                  [id number]                  [id number]  ---------------------------------------------------------------------- Indications   Premature rupture of membranes - leaking       O42.90   fluid   Hypertension - Chronic/Pre-existing (no        O10.019   meds)   Pre-existing diabetes, type 2, in pregnancy,   O24.112   second trimester (metformin, insulin, not   currently taking either)   [redacted] weeks gestation of pregnancy                Z3A.26  ---------------------------------------------------------------------- Fetal Evaluation  Num Of Fetuses:          1  Fetal Heart Rate(bpm):  142  Cardiac Activity:       Observed  Presentation:           Cephalic  Placenta:               Right lateral  Amniotic Fluid  AFI FV:      Subjectively decreased                              Largest Pocket(cm)                              3.51 ---------------------------------------------------------------------- Biophysical Evaluation  Amniotic F.V:   Pocket => 2 cm two         F. Tone:        Observed                  planes  F. Movement:    Observed                   Score:          8/8  F. Breathing:   Observed ---------------------------------------------------------------------- OB History  Gravidity:    3         Term:   2        Prem:   0        SAB:   0  TOP:          0       Ectopic:  0        Living: 2 ---------------------------------------------------------------------- Gestational Age  LMP:           29w 2d        Date:  10/07/17                 EDD:   07/14/18  Best:          [place] 5d     Det. ByMarcella Dubs         EDD:   08/01/18                                      (  12/22/17) ---------------------------------------------------------------------- Anatomy  Stomach:               Appears normal, left   Bladder:                Appears normal                         sided ---------------------------------------------------------------------- Cervix Uterus Adnexa  Cervix  Not visualized (advanced GA >24wks)  Uterus  No abnormality visualized.  Left Ovary  Not visualized.  Right Ovary  Not visualized.  Adnexa  No abnormality visualized. No adnexal mass  visualized. ---------------------------------------------------------------------- Comments  U/S images reviewed. Findings reviewed.  No evidence of  fetal compromise is found on BPP today.  No fetal  abnormalities are seen.   Recommendations: 1) Serial U/S every 4 weeks for fetal  growth  2) Twice weekly 3) Delivery @ 34 weeks ---------------------------------------------------------------------- Recommendations  1)  Serial U/S every 4 weeks for fetal growth  2) Twice weekly  3) Delivery @ 34 weeks ----------------------------------------------------------------------               Patsi Sears, MD Electronically Signed Final Report   04/30/2018 10:41 am ----------------------------------------------------------------------  [place] Mfm Ob Limited  Result Date: 04/16/2018 ----------------------------------------------------------------------  OBSTETRICS REPORT                       (Signed Final 04/16/2018 05:00 pm) ---------------------------------------------------------------------- Patient Info  ID #:       [id number]                          D.O.B.:  [birth year] (27 yrs)  Name:       Kathryn Shelton                  Visit Date: 04/16/2018 07:32 am ---------------------------------------------------------------------- Performed By  Performed By:     Eden Lathe BS      Ref. Address:     [address]                    [address]T                                                             Rd  Attending:        Noralee Space MD        Secondary Phy.:   3rd Nursing- HR                                                             OB                                                             3rd Floor  Referred By:      Conan Bowens  Location:         [facility]                    MD ---------------------------------------------------------------------- Orders   #  Description                          Code         Ordered By   1  [place] MFM OB LIMITED                    16109.60     Jaynie Collins  ----------------------------------------------------------------------   #  Order #                    Accession #                 Episode #   1  [id number]                  [id number]                  [id number]  ---------------------------------------------------------------------- Indications   Hypertension - Chronic/Pre-existing (no        O10.019   meds)   Pre-existing diabetes, type 2, in pregnancy,   O24.112    second trimester (metformin, insulin, not   currently taking either)   Premature rupture of membranes - leaking       O42.90   fluid   [redacted] weeks gestation of pregnancy                Z3A.24  ---------------------------------------------------------------------- Fetal Evaluation  Num Of Fetuses:         1  Fetal Heart Rate(bpm):  161  Cardiac Activity:       Observed  Presentation:           Cephalic  Placenta:               Right lateral  P. Cord Insertion:      Previously Visualized  Amniotic Fluid  AFI FV:      Oligohydramnios                              Largest Pocket(cm)                              3.2 ---------------------------------------------------------------------- OB History  Gravidity:    3         Term:   2        Prem:   0        SAB:   0  TOP:          0       Ectopic:  0        Living: 2 ---------------------------------------------------------------------- Gestational Age  LMP:           27w 2d        Date:  10/07/17                 EDD:   07/14/18  Best:          24w 5d     Det. ByMarcella Dubs         EDD:   08/01/18                                      (  12/22/17) ---------------------------------------------------------------------- Anatomy  Cranium:               Appears normal         Posterior Fossa:        Appears normal  Cavum:                 Appears normal         Stomach:                Appears normal, left                                                                        sided  Ventricles:            Appears normal         Kidneys:                Appear normal  Choroid Plexus:        Appears normal         Bladder:                Appears normal  Cerebellum:            Appears normal ---------------------------------------------------------------------- Cervix Uterus Adnexa  Cervix  Not visualized (advanced GA >24wks)  Uterus  No abnormality visualized.  Left Ovary  Not visualized.  Right Ovary  Not visualized.  Cul De Sac  No free fluid seen.  Adnexa  No abnormality  visualized. ---------------------------------------------------------------------- Impression  Patient is admitted with the diagnosis PPROM.  On ultrasound, amniotic fluid is decreased. Deepest vertical  pocket measures 3 cm. Good fetal activity is seen. ----------------------------------------------------------------------                  Noralee Space, MD Electronically Signed Final Report   04/16/2018 05:00 pm ----------------------------------------------------------------------    Medications:  Scheduled . aspirin EC  81 mg Oral Daily  . docusate sodium  100 mg Oral Daily  . Influenza vac split quadrivalent PF  0.5 mL Intramuscular Tomorrow-1000  . insulin aspart  0-14 Units Subcutaneous TID PC  . insulin aspart  12 Units Subcutaneous TID WC  . insulin glargine  17 Units Subcutaneous QAC breakfast  . insulin glargine  8 Units Subcutaneous QHS  . loratadine  10 mg Oral Daily  . prenatal multivitamin  1 tablet Oral Q1200   I have reviewed the patient's current medications.  ASSESSMENT: Patient Active Problem List   Diagnosis Date Noted  . Oligohydramnios antepartum, second trimester, other fetus 03/09/2018  . Obesity, morbid (HCC) 03/09/2018  . Preterm premature rupture of membranes, unspecified as to length of time between rupture and onset of labor, second trimester 03/09/2018  . Supervision of high risk pregnancy, antepartum 12/31/2017  . Chronic hypertension during pregnancy, antepartum 12/31/2017  . Pre-existing type 2 diabetes mellitus during pregnancy, antepartum 12/31/2017  . Mixed hyperlipidemia 12/02/2017  . Type II diabetes mellitus, uncontrolled (HCC) 03/01/2017  . Essential hypertension 03/01/2017  . Intermittent palpitations 03/01/2017  . Panic attack 03/01/2017  . Obstructive sleep apnea syndrome 03/01/2017    PLAN: Continue insulin regimen with SSI  and regular meals,appreciate input from Inpatient Diabetic Coordinator  Continue CBGs fasting and 2 hours  postprandial, fetal monitoring  bid unless there is any acute concern Continue weekly BPPs with BID NSTs Continue inpatient care  Jaynie Collins, MD 05/07/2018,12:26 PM

## 2018-05-08 LAB — GLUCOSE, CAPILLARY
Glucose-Capillary: 164 mg/dL — ABNORMAL HIGH (ref 70–99)
Glucose-Capillary: 153 mg/dL — ABNORMAL HIGH (ref 70–99)
Glucose-Capillary: 129 mg/dL — ABNORMAL HIGH (ref 70–99)

## 2018-05-08 NOTE — Progress Notes (Signed)
Unable to get fasting CBG on pt. Nurse tech reminded pt at 07:45 to call out when she gets a meal tray. RN entered room at 08:20 and pt stated she had eaten a chicken sandwich and chips for breakfast around 08:00. Educated pt on importance of informing staff when she is about to eat.

## 2018-05-08 NOTE — Progress Notes (Signed)
Patient ID: Kathryn SerOctavia Valverde, female   DOB: 1990/09/27, 27 y.o.   MRN: [id number] FACULTY PRACTICE ANTEPARTUM(COMPREHENSIVE) NOTE  Kathryn Shelton is a 27 y.o. G3P2002 at 6754w6d by best clinical estimate who is admitted for PROM.   Fetal presentation is cephalic. Length of Stay:  36  Days  Subjective: Tired of being here. States "I'm going crazy in here" Patient reports the fetal movement as active. Patient reports uterine contraction  activity as none. Patient reports  vaginal bleeding as none. Patient describes fluid per vagina as Clear.  Vitals:  Blood pressure (!) 89/59, pulse 87, temperature 98.9 F (37.2 C), temperature source Oral, resp. rate 16, height 5\' 6"  (1.676 m), weight 100.6 kg, last menstrual period 10/07/2017, SpO2 99 %. Physical Examination:  General appearance - alert, well appearing, and in no distress Chest - normal effort Abdomen - gravid, non-tender Fundal Height:  size equals dates Extremities: Homans sign is negative, no sign of DVT  Membranes:ruptured, clear fluid  Fetal Monitoring:  Baseline: 130 bpm, Variability: Good {> 6 bpm), Accelerations: Reactive and Decelerations: Absent  Labs:  Results for orders placed or performed during the hospital encounter of 04/02/18 (from the past 24 hour(s))  Glucose, capillary   Collection Time: 05/07/18 11:21 AM  Result Value Ref Range   Glucose-Capillary 89 70 - 99 mg/dL  Glucose, capillary   Collection Time: 05/07/18  2:19 PM  Result Value Ref Range   Glucose-Capillary 110 (H) 70 - 99 mg/dL  Glucose, capillary   Collection Time: 05/07/18  9:31 PM  Result Value Ref Range   Glucose-Capillary 219 (H) 70 - 99 mg/dL   U/s 91/47/8212/13/19 vtx, AFI 5.71, BPP 8/8  Medications:  Scheduled . aspirin EC  81 mg Oral Daily  . docusate sodium  100 mg Oral Daily  . Influenza vac split quadrivalent PF  0.5 mL Intramuscular Tomorrow-1000  . insulin aspart  0-14 Units Subcutaneous TID PC  . insulin aspart  12 Units Subcutaneous TID WC   . insulin glargine  17 Units Subcutaneous QAC breakfast  . insulin glargine  8 Units Subcutaneous QHS  . loratadine  10 mg Oral Daily  . prenatal multivitamin  1 tablet Oral Q1200   I have reviewed the patient's current medications.  ASSESSMENT: Principal Problem:   Preterm premature rupture of membranes, unspecified as to length of time between rupture and onset of labor, second trimester Active Problems:   Supervision of high risk pregnancy, antepartum   Chronic hypertension during pregnancy, antepartum   Pre-existing type 2 diabetes mellitus during pregnancy, antepartum   Oligohydramnios antepartum, second trimester, other fetus   PLAN: Had some worsening CBG's yesterday due to eating a chicken sandwich per her.--will not adjust insulin based on 1 CBGs, but continue to watch. Hypoglycemia, likely relates to stacking insulins. Stable PPROM Deliver with s/sx's of infection or worsening maternal/fetal status.  Reva Boresanya S , MD 05/08/2018,9:58 AM

## 2018-05-08 NOTE — Progress Notes (Signed)
Pt refusing labs this morning. MD aware.

## 2018-05-09 LAB — GLUCOSE, CAPILLARY
Glucose-Capillary: 70 mg/dL (ref 70–99)
Glucose-Capillary: 83 mg/dL (ref 70–99)

## 2018-05-09 MED ORDER — INSULIN GLARGINE 100 UNIT/ML ~~LOC~~ SOLN
18.0000 [IU] | Freq: Every day | SUBCUTANEOUS | Status: DC
  Administered 2018-05-10 – 2018-05-12 (×3): 18 [IU] via SUBCUTANEOUS
  Filled 2018-05-09 (×3): qty 0.18

## 2018-05-09 MED ORDER — INSULIN GLARGINE 100 UNIT/ML ~~LOC~~ SOLN
9.0000 [IU] | Freq: Every day | SUBCUTANEOUS | Status: DC
  Administered 2018-05-09 – 2018-05-11 (×3): 9 [IU] via SUBCUTANEOUS
  Filled 2018-05-09 (×3): qty 0.09

## 2018-05-09 NOTE — Progress Notes (Signed)
CBG meter not transferring over to chart. Pt has CBG 133.

## 2018-05-09 NOTE — Progress Notes (Signed)
Patient ID: Kathryn Shelton, female   DOB: 1991/02/04, 27 y.o.   MRN: [id number] FACULTY PRACTICE ANTEPARTUM(COMPREHENSIVE) NOTE  Kathryn Shelton is a 27 y.o. G3P2002 at 7125w0d by best clinical estimate who is admitted for PROM.   Fetal presentation is cephalic. Length of Stay:  37  Days  Subjective: Feels well. CBGs are worsening Patient reports the fetal movement as active. Patient reports uterine contraction  activity as none. Patient reports  vaginal bleeding as none. Patient describes fluid per vagina as Clear.  Vitals:  Blood pressure 121/78, pulse (!) 108, temperature 98.3 F (36.8 C), temperature source Oral, resp. rate 18, height 5\' 6"  (1.676 m), weight 100.6 kg, last menstrual period 10/07/2017, SpO2 99 %. Physical Examination:  General appearance - alert, well appearing, and in no distress Chest - normal effort Abdomen - gravid, non-tender Fundal Height:  size equals dates Extremities: Homans sign is negative, no sign of DVT  Membranes:ruptured, clear fluid  Fetal Monitoring:  Baseline: 140 bpm, Variability: Good {> 6 bpm), Accelerations: Reactive and Decelerations: Absent  Labs:  Results for orders placed or performed during the hospital encounter of 04/02/18 (from the past 24 hour(s))  Glucose, capillary   Collection Time: 05/08/18  4:05 PM  Result Value Ref Range   Glucose-Capillary 153 (H) 70 - 99 mg/dL  Glucose, capillary   Collection Time: 05/08/18  8:03 PM  Result Value Ref Range   Glucose-Capillary 129 (H) 70 - 99 mg/dL  Glucose, capillary   Collection Time: 05/09/18  9:06 AM  Result Value Ref Range   Glucose-Capillary 70 70 - 99 mg/dL     Medications:  Scheduled . aspirin EC  81 mg Oral Daily  . docusate sodium  100 mg Oral Daily  . Influenza vac split quadrivalent PF  0.5 mL Intramuscular Tomorrow-1000  . insulin aspart  0-14 Units Subcutaneous TID PC  . insulin aspart  12 Units Subcutaneous TID WC  . insulin glargine  17 Units Subcutaneous QAC  breakfast  . insulin glargine  8 Units Subcutaneous QHS  . loratadine  10 mg Oral Daily  . prenatal multivitamin  1 tablet Oral Q1200   I have reviewed the patient's current medications.  ASSESSMENT: Principal Problem:   Preterm premature rupture of membranes, unspecified as to length of time between rupture and onset of labor, second trimester Active Problems:   Supervision of high risk pregnancy, antepartum   Chronic hypertension during pregnancy, antepartum   Pre-existing type 2 diabetes mellitus during pregnancy, antepartum   Oligohydramnios antepartum, second trimester, other fetus  PLAN: Increase basal insulin today as all CBGs up yesterday Delivery with s/sx's of infection or worsening maternal/fetal status  Reva Boresanya S Yehia Mcbain, MD 05/09/2018,10:57 AM

## 2018-05-09 NOTE — Progress Notes (Signed)
CBG 162 at 2000. CBG meter not transferring to chart.

## 2018-05-09 NOTE — Progress Notes (Signed)
Pt refused labs this morning. Will continue to assess readiness for labs.

## 2018-05-10 LAB — GLUCOSE, CAPILLARY
Glucose-Capillary: 133 mg/dL — ABNORMAL HIGH (ref 70–99)
Glucose-Capillary: 162 mg/dL — ABNORMAL HIGH (ref 70–99)
Glucose-Capillary: 85 mg/dL (ref 70–99)
Glucose-Capillary: 119 mg/dL — ABNORMAL HIGH (ref 70–99)
Glucose-Capillary: 82 mg/dL (ref 70–99)
Glucose-Capillary: 181 mg/dL — ABNORMAL HIGH (ref 70–99)

## 2018-05-10 MED ORDER — DTAP-HEPATITIS B RECOMB-IPV IM SUSP: 0.5000 mL | Freq: Once | INTRAMUSCULAR | Status: DC

## 2018-05-10 MED ORDER — INFLUENZA VAC SPLIT QUAD 0.5 ML IM SUSY: 0.5000 mL | PREFILLED_SYRINGE | INTRAMUSCULAR | Status: DC

## 2018-05-10 NOTE — Progress Notes (Signed)
Patient ID: Kathryn Shelton, female   DOB: [birth year], 27 y.o.   MRN: [id number] ACULTY PRACTICE ANTEPARTUM COMPREHENSIVE PROGRESS NOTE  Kathryn Shelton is a 27 y.o. G3P2002 at [redacted]w[redacted]d  who is admitted for PROM.   Fetal presentation is cephalic. Length of Stay:  38  Days  Subjective: No new complaints.  Patient reports good fetal movement.  She reports no uterine contractions, no bleeding. She does report further loss of fluid per vagina but, denies odor.  Vitals:  Blood pressure 95/65, pulse (!) 101, temperature 98.3 F (36.8 C), resp. rate 17, height 5\' 6"  (1.676 m), weight 100.6 kg, last menstrual period 10/07/2017, SpO2 99 %. Physical Examination: General appearance - alert, well appearing, and in no distress Abdomen - soft, nontender, nondistended, no masses or organomegaly gravid Extremities - peripheral pulses normal, no pedal edema, no clubbing or cyanosis Cervical Exam: Not evaluated.  Membranes:intact  Fetal Monitoring:  Baseline: 130's -140's   bpm, Variability: Good {> 6 bpm), Accelerations: Reactive, Decelerations: Absent and Toco: no contractions   Labs:  Results for orders placed or performed during the hospital encounter of 04/02/18 (from the past 24 hour(s))  Glucose, capillary   Collection Time: 05/09/18  9:06 AM  Result Value Ref Range   Glucose-Capillary 70 70 - 99 mg/dL  Glucose, capillary   Collection Time: 05/09/18 10:59 AM  Result Value Ref Range   Glucose-Capillary 83 70 - 99 mg/dL  Glucose, capillary   Collection Time: 05/09/18  1:51 PM  Result Value Ref Range   Glucose-Capillary 133 (H) 70 - 99 mg/dL  Glucose, capillary   Collection Time: 05/09/18  7:59 PM  Result Value Ref Range   Glucose-Capillary 162 (H) 70 - 99 mg/dL    Imaging Studies:    None pending  Medications:  Scheduled . aspirin EC  81 mg Oral Daily  . docusate sodium  100 mg Oral Daily  . Influenza vac split quadrivalent PF  0.5 mL Intramuscular Tomorrow-1000  . insulin aspart  0-14  Units Subcutaneous TID PC  . insulin aspart  12 Units Subcutaneous TID WC  . insulin glargine  18 Units Subcutaneous QAC breakfast  . insulin glargine  9 Units Subcutaneous QHS  . loratadine  10 mg Oral Daily  . prenatal multivitamin  1 tablet Oral Q1200   I have reviewed the patient's current medications.  ASSESSMENT: Patient Active Problem List   Diagnosis Date Noted  . Oligohydramnios antepartum, second trimester, other fetus 03/09/2018  . Obesity, morbid (HCC) 03/09/2018  . Preterm premature rupture of membranes, unspecified as to length of time between rupture and onset of labor, second trimester 03/09/2018  . Supervision of high risk pregnancy, antepartum 12/31/2017  . Chronic hypertension during pregnancy, antepartum 12/31/2017  . Pre-existing type 2 diabetes mellitus during pregnancy, antepartum 12/31/2017  . Mixed hyperlipidemia 12/02/2017  . Type II diabetes mellitus, uncontrolled (HCC) 03/01/2017  . Essential hypertension 03/01/2017  . Intermittent palpitations 03/01/2017  . Panic attack 03/01/2017  . Obstructive sleep apnea syndrome 03/01/2017    PLAN: Principal Problem:   Preterm premature rupture of membranes, unspecified as to length of time between rupture and onset of labor, second trimester Active Problems:   Supervision of high risk pregnancy, antepartum   Chronic hypertension during pregnancy, antepartum   Pre-existing type 2 diabetes mellitus during pregnancy, antepartum   Oligohydramnios antepartum, second trimester, other fetus  PLAN: continue to monitor daily FSG levels.  Delivery with s/sx's of infection or worsening maternal/fetal status Flu shot and Tdap today  Kathryn Shelton 05/10/2018,7:54 AM

## 2018-05-11 LAB — TYPE AND SCREEN
ABO/RH(D): A POS
Antibody Screen: NEGATIVE

## 2018-05-11 LAB — GLUCOSE, CAPILLARY
Glucose-Capillary: 75 mg/dL (ref 70–99)
Glucose-Capillary: 124 mg/dL — ABNORMAL HIGH (ref 70–99)
Glucose-Capillary: 139 mg/dL — ABNORMAL HIGH (ref 70–99)
Glucose-Capillary: 150 mg/dL — ABNORMAL HIGH (ref 70–99)
Glucose-Capillary: 277 mg/dL — ABNORMAL HIGH (ref 70–99)

## 2018-05-11 MED ORDER — BETAMETHASONE SOD PHOS & ACET 6 (3-3) MG/ML IJ SUSP
12.0000 mg | INTRAMUSCULAR | Status: AC
  Administered 2018-05-11 – 2018-05-12 (×2): 12 mg via INTRAMUSCULAR
  Filled 2018-05-11 (×2): qty 2

## 2018-05-11 MED ORDER — ONDANSETRON 4 MG PO TBDP
4.0000 mg | ORAL_TABLET | Freq: Four times a day (QID) | ORAL | Status: DC | PRN
  Administered 2018-05-11: 4 mg via ORAL
  Filled 2018-05-11 (×2): qty 1

## 2018-05-11 NOTE — Progress Notes (Signed)
Patient refuses monitoring at this time.

## 2018-05-11 NOTE — Progress Notes (Signed)
Pt called RN in to report she was nauseated following lab draw. Pt vomited while RN was in room. Pt stated that she did not feel sick, feverish, or have any other symptoms. Pt was not having any contractions. CBG was obtained; it was 75. Vital signs WDL.  When RN reassessed nausea, patient reported it was improved.

## 2018-05-11 NOTE — Progress Notes (Signed)
Daily Antepartum Note  Admission Date: 04/02/2018 Current Date: 05/11/2018 10:17 AM  Kathryn Shelton is a 27 y.o. [id number] @ [redacted]w[redacted]d, admitted for PPROM (since 17wks).  Pregnancy complicated by: Patient Active Problem List   Diagnosis Date Noted  . Oligohydramnios antepartum, second trimester, other fetus 03/09/2018  . Obesity, morbid (HCC) 03/09/2018  . Preterm premature rupture of membranes, unspecified as to length of time between rupture and onset of labor, second trimester 03/09/2018  . Supervision of high risk pregnancy, antepartum 12/31/2017  . Chronic hypertension during pregnancy, antepartum 12/31/2017  . Pre-existing type 2 diabetes mellitus during pregnancy, antepartum 12/31/2017  . Mixed hyperlipidemia 12/02/2017  . Type II diabetes mellitus, uncontrolled (HCC) 03/01/2017  . Essential hypertension 03/01/2017  . Intermittent palpitations 03/01/2017  . Panic attack 03/01/2017  . Obstructive sleep apnea syndrome 03/01/2017    Overnight/24hr events:  none  Subjective:  No s/s of infection, PTL  Objective:    Current Vital Signs 24h Vital Sign Ranges  T 98.6 F (37 C) Temp  Avg: 98.6 F (37 C)  Min: 98.5 F (36.9 C)  Max: 98.8 F (37.1 C)  BP 108/86 BP  Min: 100/62  Max: 128/72  HR (!) 103 Pulse  Avg: 100.3  Min: 96  Max: 105  RR (!) 21 Resp  Avg: 18.5  Min: 16  Max: 21  SaO2 100 % Room Air SpO2  Avg: 98.3 %  Min: 94 %  Max: 100 %       24 Hour I/O Current Shift I/O  Time Ins Outs No intake/output data recorded. No intake/output data recorded.   EFM: 145 baseline, + accels, no decel, mod variability Toco: quiet x 29m  Physical exam: General: Well nourished, well developed female in no acute distress. Abdomen: gravid nttp Cardiovascular: S1, S2 normal, no murmur, rub or gallop, regular rate and rhythm Respiratory: CTAB Extremities: no clubbing, cyanosis or edema Skin: Warm and dry.   Medications: Current Facility-Administered Medications  Medication Dose  Route Frequency Provider Last Rate Last Dose  . 0.9 %  sodium chloride infusion   Intravenous PRN Conan Bowens, MD   Stopped at 04/05/18 [phone]  . acetaminophen (TYLENOL) tablet 650 mg  650 mg Oral Q4H PRN Conan Bowens, MD   650 mg at 04/29/18 2129  . aspirin EC tablet 81 mg  81 mg Oral Daily Conan Bowens, MD   81 mg at 05/10/18 9629  . calcium carbonate (TUMS - dosed in mg elemental calcium) chewable tablet 400 mg of elemental calcium  2 tablet Oral Q4H PRN Conan Bowens, MD   400 mg of elemental calcium at 04/30/18 2315  . docusate sodium (COLACE) capsule 100 mg  100 mg Oral Daily Conan Bowens, MD   100 mg at 05/10/18 5284  . insulin aspart (novoLOG) injection 0-14 Units  0-14 Units Subcutaneous TID PC Anyanwu, Jethro Bastos, MD   3 Units at 05/10/18 2014  . insulin aspart (novoLOG) injection 12 Units  12 Units Subcutaneous TID WC Anyanwu, Jethro Bastos, MD   12 Units at 05/10/18 1748  . insulin glargine (LANTUS) injection 18 Units  18 Units Subcutaneous QAC breakfast Reva Bores, MD   18 Units at 05/10/18 [phone]  . insulin glargine (LANTUS) injection 9 Units  9 Units Subcutaneous QHS Reva Bores, MD   9 Units at 05/10/18 2204  . loratadine (CLARITIN) tablet 10 mg  10 mg Oral Daily Tilda Burrow, MD   10 mg at 05/10/18 0835  .  ondansetron (ZOFRAN-ODT) disintegrating tablet 4 mg  4 mg Oral Q6H PRN [place] BingPickens, Lauro Manlove, MD   4 mg at 05/11/18 0855  . prenatal multivitamin tablet 1 tablet  1 tablet Oral Q1200 Conan Bowensavis, Kelly M, MD   1 tablet at 05/10/18 [phone]  . sodium chloride (OCEAN) 0.65 % nasal spray 1 spray  1 spray Each Nare PRN Tilda BurrowFerguson, John V, MD   1 spray at 05/02/18 0932    Labs:  Results for Kathryn Shelton, Kathryn (MRN [id number]) as of 05/11/2018 10:23  Ref. Range 05/10/2018 11:20 05/10/2018 16:05 05/10/2018 20:02 05/11/2018 05:23 05/11/2018 05:44  Glucose-Capillary Latest Ref Range: 70 - 99 mg/dL 119119 (H) 82 147181 (H)  75   Radiology:  12/13: ceph, AFI 5.7, bpp 8/8 11/29: ceph, efw 58%, 868gm, ac  66%  Assessment & Plan:  Pt doing well *Pregnancy: reactive NST. Routine care. -not sure about BTL *DM2: continue current regimen. May have to adjust due to rescue bmz to be given today and tomorrow *Preterm: no current issues. Pt amenable to rescue bmz -s/p NICU consult -s/p BMZ 11/8 and 11/9 -s/p Mg on admission *PPROM: no current issues -s/p latency abx *PPx: SCDs, OOB *FEN/GI: pt prefers regular diet. *Dispo: pending delivery at 34wks  Cornelia Copaharlie Fabio Wah, Jr. MD Attending Center for [facility](Faculty Practice)

## 2018-05-12 LAB — GLUCOSE, CAPILLARY
GLUCOSE-CAPILLARY: 216 mg/dL — AB (ref 70–99)
Glucose-Capillary: 130 mg/dL — ABNORMAL HIGH (ref 70–99)
Glucose-Capillary: 225 mg/dL — ABNORMAL HIGH (ref 70–99)
Glucose-Capillary: 240 mg/dL — ABNORMAL HIGH (ref 70–99)
Glucose-Capillary: 219 mg/dL — ABNORMAL HIGH (ref 70–99)
Glucose-Capillary: 214 mg/dL — ABNORMAL HIGH (ref 70–99)
Glucose-Capillary: 258 mg/dL — ABNORMAL HIGH (ref 70–99)
Glucose-Capillary: 305 mg/dL — ABNORMAL HIGH (ref 70–99)

## 2018-05-12 MED ORDER — INSULIN ASPART 100 UNIT/ML ~~LOC~~ SOLN
13.0000 [IU] | Freq: Three times a day (TID) | SUBCUTANEOUS | Status: DC
  Administered 2018-05-12 – 2018-05-14 (×5): 13 [IU] via SUBCUTANEOUS

## 2018-05-12 MED ORDER — INSULIN GLARGINE 100 UNIT/ML ~~LOC~~ SOLN
11.0000 [IU] | Freq: Every day | SUBCUTANEOUS | Status: DC
  Administered 2018-05-12 – 2018-05-13 (×2): 11 [IU] via SUBCUTANEOUS
  Filled 2018-05-12 (×3): qty 0.11

## 2018-05-12 MED ORDER — INSULIN GLARGINE 100 UNIT/ML ~~LOC~~ SOLN
21.0000 [IU] | Freq: Every day | SUBCUTANEOUS | Status: DC
  Administered 2018-05-13 – 2018-05-14 (×2): 21 [IU] via SUBCUTANEOUS
  Filled 2018-05-12 (×3): qty 0.21

## 2018-05-12 NOTE — Progress Notes (Signed)
Inpatient Diabetes Program Recommendations  AACE/ADA: New Consensus Statement on Inpatient Glycemic Control (2015)  Target Ranges:  Prepandial:   less than 140 mg/dL      Peak postprandial:   less than 180 mg/dL (1-2 hours)      Critically ill patients:  140 - 180 mg/dL   Lab Results  Component Value Date   GLUCAP 130 (H) 05/12/2018   HGBA1C 8.2 (H) 01/14/2018    Review of Glycemic Control Results for Kathryn Shelton, Kathryn Shelton (MRN [id number]) as of 05/12/2018 08:51  Ref. Range 05/11/2018 14:59 05/11/2018 19:46 05/12/2018 08:26  Glucose-Capillary Latest Ref Range: 70 - 99 mg/dL 409150 (H) 811277 (H) 914130 (H)   Diabetes history:Type 2 DM Outpatient Diabetes medications:Lantus 10 units QHS Current orders for Inpatient glycemic control:Lantus9units QHS, Lantus 18units QAM, Novolog12units TID, Novolog 0-14 units TID  Inpatient Diabetes Program Recommendations:   Repeat BMZ x 1, thus anticipate blood glucose trends to increase. Additionally, patient missed Novolog 12 units at lunch yesterday because patient did not report meal.   Recommend increasing Novolog 14 units TID (still assuming patient is consuming >50% of meal). May need to further adjust once steroids complete.  Thanks, Kathryn RaveLauren Aritha Huckeba, MSN, RNC-OB Diabetes Coordinator [phone] (8a-5p)

## 2018-05-13 LAB — GLUCOSE, CAPILLARY
Glucose-Capillary: 110 mg/dL — ABNORMAL HIGH (ref 70–99)
Glucose-Capillary: 173 mg/dL — ABNORMAL HIGH (ref 70–99)
Glucose-Capillary: 188 mg/dL — ABNORMAL HIGH (ref 70–99)
Glucose-Capillary: 196 mg/dL — ABNORMAL HIGH (ref 70–99)
Glucose-Capillary: 313 mg/dL — ABNORMAL HIGH (ref 70–99)

## 2018-05-14 ENCOUNTER — Inpatient Hospital Stay (HOSPITAL_BASED_OUTPATIENT_CLINIC_OR_DEPARTMENT_OTHER): Payer: Medicaid Other

## 2018-05-14 DIAGNOSIS — O10013 Pre-existing essential hypertension complicating pregnancy, third trimester: Secondary | ICD-10-CM

## 2018-05-14 DIAGNOSIS — O24113 Pre-existing diabetes mellitus, type 2, in pregnancy, third trimester: Secondary | ICD-10-CM

## 2018-05-14 DIAGNOSIS — O4290 Premature rupture of membranes, unspecified as to length of time between rupture and onset of labor, unspecified weeks of gestation: Secondary | ICD-10-CM

## 2018-05-14 DIAGNOSIS — Z3A28 28 weeks gestation of pregnancy: Secondary | ICD-10-CM

## 2018-05-14 LAB — GLUCOSE, CAPILLARY
GLUCOSE-CAPILLARY: 144 mg/dL — AB (ref 70–99)
Glucose-Capillary: 171 mg/dL — ABNORMAL HIGH (ref 70–99)
Glucose-Capillary: 142 mg/dL — ABNORMAL HIGH (ref 70–99)
Glucose-Capillary: 175 mg/dL — ABNORMAL HIGH (ref 70–99)
Glucose-Capillary: 155 mg/dL — ABNORMAL HIGH (ref 70–99)

## 2018-05-14 MED ORDER — INSULIN ASPART 100 UNIT/ML ~~LOC~~ SOLN
15.0000 [IU] | Freq: Three times a day (TID) | SUBCUTANEOUS | Status: DC
  Administered 2018-05-14 (×2): 15 [IU] via SUBCUTANEOUS

## 2018-05-14 MED ORDER — INSULIN GLARGINE 100 UNIT/ML ~~LOC~~ SOLN
13.0000 [IU] | Freq: Every day | SUBCUTANEOUS | Status: DC
  Administered 2018-05-14: 13 [IU] via SUBCUTANEOUS
  Filled 2018-05-14: qty 0.13

## 2018-05-14 MED ORDER — INSULIN GLARGINE 100 UNIT/ML ~~LOC~~ SOLN
23.0000 [IU] | Freq: Every day | SUBCUTANEOUS | Status: DC
  Filled 2018-05-14: qty 0.23

## 2018-05-14 NOTE — Progress Notes (Signed)
Daily Antepartum Note  Admission Date: 04/02/2018 Current Date: 05/14/2018 10:04 AM  Kathryn Shelton is a 27 y.o. [id number] @ 1490w5d, admitted for PPROM (since 17wks).  Pregnancy complicated by: Patient Active Problem List   Diagnosis Date Noted  . Oligohydramnios antepartum, second trimester, other fetus 03/09/2018  . Obesity, morbid (HCC) 03/09/2018  . Preterm premature rupture of membranes, unspecified as to length of time between rupture and onset of labor, second trimester 03/09/2018  . Supervision of high risk pregnancy, antepartum 12/31/2017  . Chronic hypertension during pregnancy, antepartum 12/31/2017  . Pre-existing type 2 diabetes mellitus during pregnancy, antepartum 12/31/2017  . Mixed hyperlipidemia 12/02/2017  . Type II diabetes mellitus, uncontrolled (HCC) 03/01/2017  . Essential hypertension 03/01/2017  . Intermittent palpitations 03/01/2017  . Panic attack 03/01/2017  . Obstructive sleep apnea syndrome 03/01/2017    Overnight/24hr events:  none  Subjective:  No s/s of infection, PTL  Objective:    Current Vital Signs 24h Vital Sign Ranges  T 98.5 F (36.9 C) Temp  Avg: 98.6 F (37 C)  Min: 98.1 F (36.7 C)  Max: 99.3 F (37.4 C)  BP 97/60 BP  Min: 97/60  Max: 131/81  HR 100 Pulse  Avg: 92.8  Min: 72  Max: 104  RR 18 Resp  Avg: 18  Min: 18  Max: 18  SaO2 97 % Room Air SpO2  Avg: 99.2 %  Min: 97 %  Max: 100 %       24 Hour I/O Current Shift I/O  Time Ins Outs No intake/output data recorded. No intake/output data recorded.   EFM: 140 baseline, + accels, no decel, mod variability Toco: quiet x 1751m  Physical exam: General: Well nourished, well developed female in no acute distress. Abdomen: gravid nttp Cardiovascular: S1, S2 normal, no murmur, rub or gallop, regular rate and rhythm Respiratory: CTAB Extremities: no clubbing, cyanosis or edema Skin: Warm and dry.   Medications: Current Facility-Administered Medications  Medication Dose Route  Frequency Provider Last Rate Last Dose  . 0.9 %  sodium chloride infusion   Intravenous PRN Conan Bowensavis, Kelly M, MD   Stopped at 04/05/18 [phone]  . acetaminophen (TYLENOL) tablet 650 mg  650 mg Oral Q4H PRN Conan Bowensavis, Kelly M, MD   650 mg at 05/12/18 2150  . aspirin EC tablet 81 mg  81 mg Oral Daily Conan Bowensavis, Kelly M, MD   81 mg at 05/13/18 1048  . calcium carbonate (TUMS - dosed in mg elemental calcium) chewable tablet 400 mg of elemental calcium  2 tablet Oral Q4H PRN Conan Bowensavis, Kelly M, MD   400 mg of elemental calcium at 05/14/18 0312  . docusate sodium (COLACE) capsule 100 mg  100 mg Oral Daily Conan Bowensavis, Kelly M, MD   100 mg at 05/13/18 1048  . insulin aspart (novoLOG) injection 0-14 Units  0-14 Units Subcutaneous TID PC Anyanwu, Jethro BastosUgonna A, MD   8 Units at 05/13/18 1418  . insulin aspart (novoLOG) injection 13 Units  13 Units Subcutaneous TID WC [place] BingPickens, Jori Thrall, MD   13 Units at 05/14/18 0845  . insulin glargine (LANTUS) injection 11 Units  11 Units Subcutaneous QHS [place] BingPickens, Lagena Strand, MD   11 Units at 05/13/18 2242  . insulin glargine (LANTUS) injection 21 Units  21 Units Subcutaneous QAC breakfast [place] BingPickens, Valbona Slabach, MD   21 Units at 05/14/18 0845  . loratadine (CLARITIN) tablet 10 mg  10 mg Oral Daily Tilda BurrowFerguson, John V, MD   10 mg at 05/13/18 1048  . ondansetron (ZOFRAN-ODT) disintegrating  tablet 4 mg  4 mg Oral Q6H PRN [place] BingPickens, Tyrese Capriotti, MD   4 mg at 05/11/18 0855  . prenatal multivitamin tablet 1 tablet  1 tablet Oral Q1200 Conan Bowensavis, Kelly M, MD   1 tablet at 05/13/18 1159  . sodium chloride (OCEAN) 0.65 % nasal spray 1 spray  1 spray Each Nare PRN Tilda BurrowFerguson, John V, MD   1 spray at 05/02/18 0932    Labs:  Results for Kathryn SerVARNEY, Kathryn Shelton (MRN [id number]) as of 05/14/2018 10:00  Ref. Range 05/13/2018 10:42 05/13/2018 11:55 05/13/2018 14:14 05/14/2018 08:01 05/14/2018 08:24  Glucose-Capillary Latest Ref Range: 70 - 99 mg/dL 409196 (H) 811188 (H) 914313 (H)  144 (H)   Radiology:  12/20: ceph, afi 5.2, bpp 8/8 11/29: ceph, efw 58%,  868gm, ac 66%  Assessment & Plan:  Pt doing well *Pregnancy: reactive NST. Routine care. -not sure about BTL *DM2: continue current regimen. Will increase to lantus 23/13 and aspart 15 tidac. Continue with qwk bpp (next due 12/27) *Preterm: no current issues. Pt amenable to rescue bmz -s/p NICU consult -s/p BMZ 11/8 and 11/9 -s/p Mg on admission *PPROM: no current issues -s/p latency abx *PPx: SCDs, OOB *FEN/GI: pt prefers regular diet. *Dispo: pending delivery at 34wks  Cornelia Copaharlie Olimpia Tinch, Jr. MD Attending Center for [facility](Faculty Practice)

## 2018-05-14 NOTE — Progress Notes (Signed)
Daily Antepartum Note  Admission Date: 04/02/2018 Current Date: 05/12/2018 10:01 AM  Kathryn Shelton is a 27 y.o. [id number] @ 5340w3d, admitted for PPROM (since 17wks).  Pregnancy complicated by: Patient Active Problem List   Diagnosis Date Noted  . Oligohydramnios antepartum, second trimester, other fetus 03/09/2018  . Obesity, morbid (HCC) 03/09/2018  . Preterm premature rupture of membranes, unspecified as to length of time between rupture and onset of labor, second trimester 03/09/2018  . Supervision of high risk pregnancy, antepartum 12/31/2017  . Chronic hypertension during pregnancy, antepartum 12/31/2017  . Pre-existing type 2 diabetes mellitus during pregnancy, antepartum 12/31/2017  . Mixed hyperlipidemia 12/02/2017  . Type II diabetes mellitus, uncontrolled (HCC) 03/01/2017  . Essential hypertension 03/01/2017  . Intermittent palpitations 03/01/2017  . Panic attack 03/01/2017  . Obstructive sleep apnea syndrome 03/01/2017    Overnight/24hr events:  none  Subjective:  No s/s of infection, PTL  Objective:    Current Vital Signs 24h Vital Sign Ranges  T 98.5 F (36.9 C) Temp  Avg: 98.6 F (37 C)  Min: 98.1 F (36.7 C)  Max: 99.3 F (37.4 C)  BP 97/60 BP  Min: 97/60  Max: 131/81  HR 100 Pulse  Avg: 92.8  Min: 72  Max: 104  RR 18 Resp  Avg: 18  Min: 18  Max: 18  SaO2 97 % Room Air SpO2  Avg: 99.2 %  Min: 97 %  Max: 100 %       24 Hour I/O Current Shift I/O  Time Ins Outs No intake/output data recorded. No intake/output data recorded.   EFM: 135 baseline, + accels, no decel, mod variability Toco: quiet x 507m  Physical exam: General: Well nourished, well developed female in no acute distress. Abdomen: gravid nttp Cardiovascular: S1, S2 normal, no murmur, rub or gallop, regular rate and rhythm Respiratory: CTAB Extremities: no clubbing, cyanosis or edema Skin: Warm and dry.   Medications: Medication Dose Route Frequency Provider Last Rate  . 0.9 %  sodium  chloride infusion   Intravenous PRN Conan Bowensavis, Kelly M, MD    . acetaminophen (TYLENOL) tablet 650 mg  650 mg Oral Q4H PRN Conan Bowensavis, Kelly M, MD    . aspirin EC tablet 81 mg  81 mg Oral Daily Conan Bowensavis, Kelly M, MD    . calcium carbonate (TUMS - dosed in mg elemental calcium) chewable tablet 400 mg of elemental calcium  2 tablet Oral Q4H PRN Conan Bowensavis, Kelly M, MD    . docusate sodium (COLACE) capsule 100 mg  100 mg Oral Daily Conan Bowensavis, Kelly M, MD    . insulin aspart (novoLOG) injection 0-14 Units  0-14 Units Subcutaneous TID PC Anyanwu, Ugonna A, MD    . insulin aspart (novoLOG) injection 13 Units  12 Units Subcutaneous TID WC [place] BingPickens, Donita Newland, MD    . insulin glargine (LANTUS) injection 11 Units  9 Units Subcutaneous QHS [place] BingPickens, Navaeh Kehres, MD    . insulin glargine (LANTUS) injection 21 Units  18 Units Subcutaneous QAC breakfast [place] BingPickens, Makenzee Choudhry, MD    . loratadine (CLARITIN) tablet 10 mg  10 mg Oral Daily Tilda BurrowFerguson, John V, MD    . ondansetron (ZOFRAN-ODT) disintegrating tablet 4 mg  4 mg Oral Q6H PRN [place] BingPickens, George Alcantar, MD    . prenatal multivitamin tablet 1 tablet  1 tablet Oral Q1200 Conan Bowensavis, Kelly M, MD    . sodium chloride (OCEAN) 0.65 % nasal spray 1 spray  1 spray Each Nare PRN Tilda BurrowFerguson, John V, MD  Labs:  No new labs  Radiology:  12/13: ceph, AFI 5.7, bpp 8/8 11/29: ceph, efw 58%, 868gm, ac 66%  Assessment & Plan:  Pt doing well *Pregnancy: reactive NST. Routine care. -not sure about BTL *DM2: continue current regimen. Will uptitrate insulin t amenable to rescue bmz -s/p NICU consult -s/p BMZ 11/8 and 11/9 -s/p Mg on admission *PPROM: no current issues -s/p latency abx *PPx: SCDs, OOB *FEN/GI: pt prefers regular diet. *Dispo: pending delivery at 34wks  Kathryn Shelton, Jr. MD Attending Center for [facility](Faculty Practice)

## 2018-05-15 DIAGNOSIS — O24119 Pre-existing diabetes mellitus, type 2, in pregnancy, unspecified trimester: Secondary | ICD-10-CM

## 2018-05-15 DIAGNOSIS — O10919 Unspecified pre-existing hypertension complicating pregnancy, unspecified trimester: Secondary | ICD-10-CM

## 2018-05-15 LAB — GLUCOSE, CAPILLARY
Glucose-Capillary: 137 mg/dL — ABNORMAL HIGH (ref 70–99)
Glucose-Capillary: 155 mg/dL — ABNORMAL HIGH (ref 70–99)
Glucose-Capillary: 300 mg/dL — ABNORMAL HIGH (ref 70–99)
Glucose-Capillary: 143 mg/dL — ABNORMAL HIGH (ref 70–99)

## 2018-05-15 MED ORDER — INSULIN GLARGINE 100 UNIT/ML ~~LOC~~ SOLN
24.0000 [IU] | Freq: Every day | SUBCUTANEOUS | Status: DC
  Administered 2018-05-15 – 2018-05-16 (×2): 24 [IU] via SUBCUTANEOUS
  Filled 2018-05-15 (×3): qty 0.24

## 2018-05-15 MED ORDER — INSULIN GLARGINE 100 UNIT/ML ~~LOC~~ SOLN
14.0000 [IU] | Freq: Every day | SUBCUTANEOUS | Status: DC
  Administered 2018-05-15 – 2018-05-16 (×2): 14 [IU] via SUBCUTANEOUS
  Filled 2018-05-15 (×2): qty 0.14

## 2018-05-15 MED ORDER — INSULIN ASPART 100 UNIT/ML ~~LOC~~ SOLN
16.0000 [IU] | Freq: Three times a day (TID) | SUBCUTANEOUS | Status: DC
  Administered 2018-05-15 – 2018-05-16 (×6): 16 [IU] via SUBCUTANEOUS

## 2018-05-15 NOTE — Progress Notes (Signed)
Patient ID: Kathryn Shelton, female   DOB: 04-18-1991, 27 y.o.   MRN: [id number] FACULTY PRACTICE ANTEPARTUM(COMPREHENSIVE) NOTE  Kathryn SerOctavia Sans is a 27 y.o. G3P2002 at 5027w6d by best clinical estimate who is admitted for PROM.   Fetal presentation is cephalic. Length of Stay:  43  Days  Subjective: Elevated CBGs since rescue BMZ. No new issues. Patient reports the fetal movement as active. Patient reports uterine contraction  activity as none. Patient reports  vaginal bleeding as none. Patient describes fluid per vagina as Clear.  Vitals:  Blood pressure 118/79, pulse 85, temperature 98.5 F (36.9 C), temperature source Oral, resp. rate 18, height 5\' 6"  (1.676 m), weight 100.6 kg, last menstrual period 10/07/2017, SpO2 100 %. Physical Examination:  General appearance - alert, well appearing, and in no distress Chest - normal effort Abdomen - gravid, non-tender Fundal Height:  size equals dates Extremities: Homans sign is negative, no sign of DVT  Membranes:ruptured, clear fluid  Fetal Monitoring:  Baseline: 150 bpm, Variability: Good {> 6 bpm), Accelerations: Reactive and Decelerations: Variable: mild  Labs:  Results for orders placed or performed during the hospital encounter of 04/02/18 (from the past 24 hour(s))  Glucose, capillary   Collection Time: 05/14/18  8:24 AM  Result Value Ref Range   Glucose-Capillary 144 (H) 70 - 99 mg/dL  Glucose, capillary   Collection Time: 05/14/18 11:59 AM  Result Value Ref Range   Glucose-Capillary 171 (H) 70 - 99 mg/dL  Glucose, capillary   Collection Time: 05/14/18  1:57 PM  Result Value Ref Range   Glucose-Capillary 142 (H) 70 - 99 mg/dL  Glucose, capillary   Collection Time: 05/14/18  7:07 PM  Result Value Ref Range   Glucose-Capillary 175 (H) 70 - 99 mg/dL  Glucose, capillary   Collection Time: 05/14/18  8:59 PM  Result Value Ref Range   Glucose-Capillary 155 (H) 70 - 99 mg/dL    Medications:  Scheduled . aspirin EC  81 mg Oral  Daily  . docusate sodium  100 mg Oral Daily  . insulin aspart  0-14 Units Subcutaneous TID PC  . insulin aspart  16 Units Subcutaneous TID WC  . insulin glargine  14 Units Subcutaneous QHS  . insulin glargine  24 Units Subcutaneous QAC breakfast  . loratadine  10 mg Oral Daily  . prenatal multivitamin  1 tablet Oral Q1200   I have reviewed the patient's current medications.  ASSESSMENT: Principal Problem:   Preterm premature rupture of membranes, unspecified as to length of time between rupture and onset of labor, second trimester Active Problems:   Supervision of high risk pregnancy, antepartum   Chronic hypertension during pregnancy, antepartum   Pre-existing type 2 diabetes mellitus during pregnancy, antepartum   Oligohydramnios antepartum, second trimester, other fetus   PLAN: Increase all insulin to help combat second round of BMZ effect. Delivery with s/sx's of infection or worsening maternal/fetal status.  Reva Boresanya S Kendra Grissett, MD 05/15/2018,7:34 AM

## 2018-05-16 LAB — GLUCOSE, CAPILLARY
Glucose-Capillary: 120 mg/dL — ABNORMAL HIGH (ref 70–99)
Glucose-Capillary: 171 mg/dL — ABNORMAL HIGH (ref 70–99)
Glucose-Capillary: 167 mg/dL — ABNORMAL HIGH (ref 70–99)
Glucose-Capillary: 119 mg/dL — ABNORMAL HIGH (ref 70–99)

## 2018-05-16 NOTE — Progress Notes (Signed)
Patient ID: Kathryn Shelton, female   DOB: 13-Jun-1990, 27 y.o.   MRN: [id number] ACULTY PRACTICE ANTEPARTUM COMPREHENSIVE PROGRESS NOTE  Kathryn SerOctavia Prioleau is a 27 y.o. G3P2002 at 7173w0d  who is admitted for PROM since 17 weeks  Fetal presentation is cephalic. Length of Stay:  44  Days  Subjective: Pt without complaints this morning.  Patient reports good fetal movement.  She reports no uterine contractions, no bleeding.  Vitals:  Blood pressure 124/80, pulse (!) 110, temperature 98.9 F (37.2 C), temperature source Oral, resp. rate 20, height 5\' 6"  (1.676 m), weight 103.6 kg, last menstrual period 10/07/2017, SpO2 100 %.   Physical Examination: Lungs clear Heart RRR Abd soft + BS gravid non tender Ext non tender  Fetal Monitoring:  140's ,+ accels, toco no activity  Labs:  Results for orders placed or performed during the hospital encounter of 04/02/18 (from the past 24 hour(s))  Glucose, capillary   Collection Time: 05/15/18  8:32 AM  Result Value Ref Range   Glucose-Capillary 137 (H) 70 - 99 mg/dL  Glucose, capillary   Collection Time: 05/15/18 10:36 AM  Result Value Ref Range   Glucose-Capillary 155 (H) 70 - 99 mg/dL  Glucose, capillary   Collection Time: 05/15/18  2:16 PM  Result Value Ref Range   Glucose-Capillary 300 (H) 70 - 99 mg/dL  Glucose, capillary   Collection Time: 05/15/18  7:36 PM  Result Value Ref Range   Glucose-Capillary 143 (H) 70 - 99 mg/dL    Imaging Studies:       Medications:  Scheduled . aspirin EC  81 mg Oral Daily  . docusate sodium  100 mg Oral Daily  . insulin aspart  0-14 Units Subcutaneous TID PC  . insulin aspart  16 Units Subcutaneous TID WC  . insulin glargine  14 Units Subcutaneous QHS  . insulin glargine  24 Units Subcutaneous QAC breakfast  . loratadine  10 mg Oral Daily  . prenatal multivitamin  1 tablet Oral Q1200   I have reviewed the patient's current medications.  ASSESSMENT: IUP 29 weeks PPROM DM CHTN  PLAN: Stable. BS  responding to change in insulin regiment. No S/Sx of infection Delivery at 34 weeks or for maternal/fetal indications. Continue with weekly BPP Continue routine antenatal care.   Hermina StaggersMichael L Daeja Helderman 05/16/2018,7:43 AM

## 2018-05-17 LAB — GLUCOSE, CAPILLARY
GLUCOSE-CAPILLARY: 164 mg/dL — AB (ref 70–99)
Glucose-Capillary: 127 mg/dL — ABNORMAL HIGH (ref 70–99)
Glucose-Capillary: 58 mg/dL — ABNORMAL LOW (ref 70–99)
Glucose-Capillary: 126 mg/dL — ABNORMAL HIGH (ref 70–99)
Glucose-Capillary: 143 mg/dL — ABNORMAL HIGH (ref 70–99)
Glucose-Capillary: 200 mg/dL — ABNORMAL HIGH (ref 70–99)

## 2018-05-17 MED ORDER — INSULIN GLARGINE 100 UNIT/ML ~~LOC~~ SOLN
26.0000 [IU] | Freq: Every day | SUBCUTANEOUS | Status: DC
  Administered 2018-05-17 – 2018-05-18 (×2): 26 [IU] via SUBCUTANEOUS
  Filled 2018-05-17 (×3): qty 0.26

## 2018-05-17 MED ORDER — INSULIN ASPART 100 UNIT/ML ~~LOC~~ SOLN
18.0000 [IU] | Freq: Three times a day (TID) | SUBCUTANEOUS | Status: DC
  Administered 2018-05-17 – 2018-05-18 (×4): 18 [IU] via SUBCUTANEOUS

## 2018-05-17 MED ORDER — INSULIN GLARGINE 100 UNIT/ML ~~LOC~~ SOLN
16.0000 [IU] | Freq: Every day | SUBCUTANEOUS | Status: DC
  Administered 2018-05-17 – 2018-05-18 (×2): 16 [IU] via SUBCUTANEOUS
  Filled 2018-05-17 (×2): qty 0.16

## 2018-05-17 NOTE — Progress Notes (Signed)
Patient ID: Kathryn Shelton, female   DOB: 1991/02/16, 27 y.o.   MRN: [id number] ACULTY PRACTICE ANTEPARTUM COMPREHENSIVE PROGRESS NOTE  Kathryn Shelton is a 27 y.o. G3P2002 at [redacted]w[redacted]d  who is admitted for PROM since 17 weeks  Fetal presentation is cephalic. Length of Stay:  45  Days  Subjective: No complaints this morning.  Patient reports good fetal movement.  She reports no uterine contractions, no bleeding.  Vitals:  Blood pressure 115/82, pulse (!) 101, temperature 98.7 F (37.1 C), temperature source Oral, resp. rate 20, height 5\' 6"  (1.676 m), weight 103.6 kg, last menstrual period 10/07/2017, SpO2 97 %.   Physical Examination: Gen NAD Lungs clear  Heart RRR Abd soft + BS gravid non tender Ext non tender  Fetal Monitoring:  140's ,+ accels, toco no activity  Labs:  Results for orders placed or performed during the hospital encounter of 04/02/18 (from the past 24 hour(s))  Glucose, capillary   Collection Time: 05/16/18  8:34 AM  Result Value Ref Range   Glucose-Capillary 120 (H) 70 - 99 mg/dL  Glucose, capillary   Collection Time: 05/16/18 10:23 AM  Result Value Ref Range   Glucose-Capillary 171 (H) 70 - 99 mg/dL  Glucose, capillary   Collection Time: 05/16/18  5:04 PM  Result Value Ref Range   Glucose-Capillary 167 (H) 70 - 99 mg/dL  Glucose, capillary   Collection Time: 05/16/18  8:03 PM  Result Value Ref Range   Glucose-Capillary 119 (H) 70 - 99 mg/dL   CBC Latest Ref Rng & Units 05/05/2018 05/02/2018 04/29/2018  WBC 4.0 - 10.5 K/uL 6.5 6.3 4.7  Hemoglobin 12.0 - 15.0 g/dL 11.1(L) 10.5(L) 10.2(L)  Hematocrit 36.0 - 46.0 % 33.9(L) 31.6(L) 31.1(L)  Platelets 150 - 400 K/uL 325 302 271    Imaging Studies:     [place] Mfm Fetal Bpp Wo Non Stress  Result Date: 05/14/2018 ----------------------------------------------------------------------  OBSTETRICS REPORT                       (Signed Final 05/14/2018 08:38 am)  ---------------------------------------------------------------------- Patient Info  ID #:       [id number]                          D.O.B.:  1991/04/11 (27 yrs)  Name:       Kathryn Shelton                  Visit Date: 05/14/2018 07:16 am ---------------------------------------------------------------------- Performed By  Performed By:     Eden Lathe BS      Ref. Address:     [address]                    [place]                                                             Rd  Attending:        Patsi Sears      Secondary Phy.:   3rd Nursing- HR                    MD  OB                                                             3rd Floor  Referred By:      Ivory Broad DAVIS          Location:         [facility]                    MD ---------------------------------------------------------------------- Orders   #  Description                          Code         Ordered By   1  [place] MFM FETAL BPP WO NON              76819.01     Haylen Bellotti      STRESS  ----------------------------------------------------------------------   #  Order #                    Accession #                 Episode #   1  [id number]                  [id number]                  [id number]  ---------------------------------------------------------------------- Indications   Premature rupture of membranes - leaking       O42.90   fluid   Hypertension - Chronic/Pre-existing (no        O10.019   meds)   Pre-existing diabetes, type 2, in pregnancy,   O24.113   third trimester   [redacted] weeks gestation of pregnancy                Z3A.28  ---------------------------------------------------------------------- Vital Signs                                                 Height:        5'6" ---------------------------------------------------------------------- Fetal Evaluation  Num Of Fetuses:         1  Fetal Heart Rate(bpm):  148  Cardiac Activity:       Observed  Presentation:            Cephalic  Amniotic Fluid  AFI FV:      Subjectively decreased  AFI Sum(cm)     %Tile       Largest Pocket(cm)  5.2             < 3         3.37  RUQ(cm)                     LUQ(cm)  1.83                        3.37 ---------------------------------------------------------------------- Biophysical Evaluation  Amniotic F.V:   Pocket => 2 cm two         F. Tone:        Observed  planes  F. Movement:    Observed                   Score:          8/8  F. Breathing:   Observed ---------------------------------------------------------------------- OB History  Gravidity:    3         Term:   2        Prem:   0        SAB:   0  TOP:          0       Ectopic:  0        Living: 2 ---------------------------------------------------------------------- Gestational Age  LMP:           31w 2d        Date:  10/07/17                 EDD:   07/14/18  Best:          Eden Emms 5d     Det. ByMarcella Dubs         EDD:   08/01/18                                      (12/22/17) ---------------------------------------------------------------------- Comments  U/S images reviewed.   No evidence of fetal compromise is  found on BPP today.  Mild oligohydramnios is present.  Recommendations: 1) Twice weekly BPP 2) Serial U/S every  4 weeks for fetal growth 3 Dekiuvery @ 34 weeks (deliver  earlier if maternal or fetal deteriorationClose A-P surveillance ---------------------------------------------------------------------- Recommendations   1) Twice weekly BPP 2) Serial U/S every 4 weeks for fetal  growth 3 Dekiuvery @ 34 weeks (deliver earlier if maternal or  fetal deterioration) ----------------------------------------------------------------------               Patsi Sears, MD Electronically Signed Final Report   05/14/2018 08:38 am ----------------------------------------------------------------------  [place] Mfm Fetal Bpp Wo Non Stress  Result Date:  05/07/2018 ----------------------------------------------------------------------  OBSTETRICS REPORT                       (Signed Final 05/07/2018 08:43 am) ---------------------------------------------------------------------- Patient Info  ID #:       [id number]                          D.O.B.:  [birth year] (27 yrs)  Name:       Kathryn Shelton                  Visit Date: 05/07/2018 07:09 am ---------------------------------------------------------------------- Performed By  Performed By:     Lenise Arena        Ref. Address:     [address]                    [place]                                                             Rd  Attending:        Blase Mess MD       Secondary Phy.:  3rd Nursing- HR                                                             OB                                                             3rd Floor  Referred By:      Ivory BroadKELLY M DAVIS          [facility]                    MD ---------------------------------------------------------------------- Orders   #  Description                          Code         Ordered By   1  US MFM FETAL BPP WO NON              76819.01     Cassadee Vanzandt      STRESS  ----------------------------------------------------------------------   #  Order #                    Accession #                 Episode #   1  [id number]                  [id number]  ---------------------------------------------------------------------- Indications   Premature rupture of membranes - leaking       O42.90   fluid   Hypertension - Chronic/Pre-existing (no        O10.019   meds)   Pre-existing diabetes, type 2, in pregnancy,   O24.112   second trimester (metformin, insulin, not   currently taking either)   [redacted] weeks gestation of pregnancy                Z3A.27  ---------------------------------------------------------------------- Vital Signs  Weight (lb): 221                               Height:        5'6"  BMI:          35.67 ---------------------------------------------------------------------- Fetal Evaluation  Num Of Fetuses:         1  Fetal Heart Rate(bpm):  144  Cardiac Activity:       Observed  Presentation:           Cephalic  Placenta:               Right lateral  Amniotic Fluid  AFI FV:      Subjectively decreased  AFI Sum(cm)     %Tile       Largest Pocket(cm)  5.71            < 3  3.65  RUQ(cm)       RLQ(cm)       LUQ(cm)        LLQ(cm)  3.65          0             0              2.06 ---------------------------------------------------------------------- Biophysical Evaluation  Amniotic F.V:   Pocket => 2 cm two         F. Tone:        Observed                  planes  F. Movement:    Observed                   Score:          8/8  F. Breathing:   Observed ---------------------------------------------------------------------- OB History  Gravidity:    3         Term:   2        Prem:   0        SAB:   0  TOP:          0       Ectopic:  0        Living: 2 ---------------------------------------------------------------------- Gestational Age  LMP:           30w 2d        Date:  10/07/17                 EDD:   07/14/18  Best:          27w 5d     Det. ByMarcella Dubs         EDD:   08/01/18                                      (12/22/17) ---------------------------------------------------------------------- Anatomy  Thoracic:              Appears normal         Kidneys:                Appear normal  Stomach:               Appears normal, left   Bladder:                Appears normal                         sided  Abdomen:               Appears normal ---------------------------------------------------------------------- Cervix Uterus Adnexa  Cervix  Not visualized (advanced GA >24wks) ---------------------------------------------------------------------- Impression  Live singleton preterm gestation.  Pregnancy complicated by prelabor preterm rupture of  membrane.  ---------------------------------------------------------------------- Recommendations  Follow up as clinically indicated and or scheduled. ----------------------------------------------------------------------                 Blase Mess, MD Electronically Signed Final Report   05/07/2018 08:43 am ----------------------------------------------------------------------  [place] Mfm Fetal Bpp Wo Non Stress  Result Date: 05/04/2018 ----------------------------------------------------------------------  OBSTETRICS REPORT                       (Signed Final 05/04/2018 03:42 pm) ---------------------------------------------------------------------- Patient Info  ID #:  [id number]                          D.O.B.:  [birth year] (27 yrs)  Name:       Kathryn Shelton                  Visit Date: 05/03/2018 05:33 pm ---------------------------------------------------------------------- Performed By  Performed By:     Emeline Darling BS,      Ref. Address:     [address]                    [place]                                                             Rd  Attending:        Lin Landsman      Secondary Phy.:   3rd Nursing- HR                    MD                                                             OB                                                             3rd Floor  Referred By:      Conan Bowens          Location:         [facility]                    MD ---------------------------------------------------------------------- Orders   #  Description                          Code         Ordered By   1  [place] MFM FETAL BPP WO NON              76819.01     Nguyen Butler      STRESS  ----------------------------------------------------------------------   #  Order #                    Accession #                 Episode #   1  [id number]                  [id number]                  [id number]  ---------------------------------------------------------------------- Indications   Premature rupture of  membranes - leaking       O42.90   fluid   Hypertension - Chronic/Pre-existing (no        O10.019   meds)   Pre-existing diabetes, type  2, in pregnancy,   O24.112   second trimester (metformin, insulin, not   currently taking either)   [redacted] weeks gestation of pregnancy                Z3A.27  ---------------------------------------------------------------------- Fetal Evaluation  Num Of Fetuses:         1  Fetal Heart Rate(bpm):  138  Cardiac Activity:       Observed  Presentation:           Cephalic  Placenta:               Right lateral  P. Cord Insertion:      Previously Visualized  Amniotic Fluid  AFI FV:      Subjectively decreased                              Largest Pocket(cm)                              4.5 ---------------------------------------------------------------------- Biophysical Evaluation  Amniotic F.V:   Pocket => 2 cm two         F. Tone:        Observed                  planes  F. Movement:    Observed                   Score:          8/8  F. Breathing:   Observed ---------------------------------------------------------------------- OB History  Gravidity:    3         Term:   2        Prem:   0        SAB:   0  TOP:          0       Ectopic:  0        Living: 2 ---------------------------------------------------------------------- Gestational Age  LMP:           29w 5d        Date:  10/07/17                 EDD:   07/14/18  Best:          27w 1d     Det. By:  Marcella Dubs         EDD:   08/01/18                                      (12/22/17) ---------------------------------------------------------------------- Impression  Biophysical profile  AFI subjectively low but normal MVP ---------------------------------------------------------------------- Recommendations  Management per inpatient.  Consider serial growth ----------------------------------------------------------------------               Lin Landsman, MD Electronically Signed Final Report   05/04/2018 03:42 pm  ----------------------------------------------------------------------  [place] Mfm Fetal Bpp Wo Non Stress  Result Date: 04/30/2018 ----------------------------------------------------------------------  OBSTETRICS REPORT                       (Signed Final 04/30/2018 10:41 am) ---------------------------------------------------------------------- Patient Info  ID #:       [id number]  D.O.B.:  1990/08/10 (27 yrs)  Name:       Kathryn Shelton                  Visit Date: 04/30/2018 07:27 am ---------------------------------------------------------------------- Performed By  Performed By:     Vivien Rota        Ref. Address:     [address]                    [place]                                                             Rd  Attending:        Patsi Sears      Secondary Phy.:   3rd Nursing- HR                    MD                                                             OB                                                             3rd Floor  Referred By:      Conan Bowens          Location:         [facility]                    MD ---------------------------------------------------------------------- Orders   #  Description                          Code         Ordered By   1  [place] MFM OB LIMITED                    76815.01     Priya Matsen   2  [place] MFM FETAL BPP WO NON              76819.01     Metztli Sachdev      STRESS  ----------------------------------------------------------------------   #  Order #                    Accession #                 Episode #   1  [id number]                  [id number]   2  [id number]                  [id number]  [id number]  ---------------------------------------------------------------------- Indications   Premature rupture of membranes - leaking       O42.90   fluid   Hypertension - Chronic/Pre-existing (no        O10.019   meds)   Pre-existing diabetes, type 2, in pregnancy,   O24.112   second  trimester (metformin, insulin, not   currently taking either)   [redacted] weeks gestation of pregnancy                Z3A.26  ---------------------------------------------------------------------- Fetal Evaluation  Num Of Fetuses:         1  Fetal Heart Rate(bpm):  142  Cardiac Activity:       Observed  Presentation:           Cephalic  Placenta:               Right lateral  Amniotic Fluid  AFI FV:      Subjectively decreased                              Largest Pocket(cm)                              3.51 ---------------------------------------------------------------------- Biophysical Evaluation  Amniotic F.V:   Pocket => 2 cm two         F. Tone:        Observed                  planes  F. Movement:    Observed                   Score:          8/8  F. Breathing:   Observed ---------------------------------------------------------------------- OB History  Gravidity:    3         Term:   2        Prem:   0        SAB:   0  TOP:          0       Ectopic:  0        Living: 2 ---------------------------------------------------------------------- Gestational Age  LMP:           29w 2d        Date:  10/07/17                 EDD:   07/14/18  Best:          Altamese Cabal 5d     Det. By:  Marcella Dubs         EDD:   08/01/18                                      (12/22/17) ---------------------------------------------------------------------- Anatomy  Stomach:               Appears normal, left   Bladder:                Appears normal                         sided ---------------------------------------------------------------------- Cervix Uterus Adnexa  Cervix  Not visualized (advanced GA >24wks)  Uterus  No abnormality visualized.  Left Ovary  Not visualized.  Right Ovary  Not visualized.  Adnexa  No abnormality visualized. No adnexal mass  visualized. ---------------------------------------------------------------------- Comments  U/S images reviewed. Findings reviewed.  No evidence of  fetal compromise is found on BPP today.  No  fetal  abnormalities are seen.   Recommendations: 1) Serial U/S every 4 weeks for fetal  growth  2) Twice weekly 3) Delivery @ 34 weeks ---------------------------------------------------------------------- Recommendations  1) Serial U/S every 4 weeks for fetal growth  2) Twice weekly  3) Delivery @ 34 weeks ----------------------------------------------------------------------               Patsi Sears, MD Electronically Signed Final Report   04/30/2018 10:41 am ----------------------------------------------------------------------  [place] Mfm Fetal Bpp Wo Non Stress  Result Date: 04/28/2018 ----------------------------------------------------------------------  OBSTETRICS REPORT                       (Signed Final 04/28/2018 02:53 pm) ---------------------------------------------------------------------- Patient Info  ID #:       [id number]                          D.O.B.:  December 22, 1990 (27 yrs)  Name:       Kathryn Shelton                  Visit Date: 04/28/2018 01:59 pm ---------------------------------------------------------------------- Performed By  Performed By:     Earley Brooke     Ref. Address:     [address]                    [place], [place]                                                             Rd  Attending:        Patsi Sears      Secondary Phy.:   3rd Nursing- HR                    MD                                                             OB                                                             3rd Floor  Referred By:      Conan Bowens          Location:         [facility]                    MD ---------------------------------------------------------------------- Orders   #  Description                          Code         Ordered  By   1  [place] MFM FETAL BPP WO NON              76819.01     KELLY DAVIS      STRESS  ----------------------------------------------------------------------   #  Order #                    Accession #                 Episode #   1   [id number]                  [id number]                  [id number]  ---------------------------------------------------------------------- Indications   Premature rupture of membranes - leaking       O42.90   fluid   [redacted] weeks gestation of pregnancy                Z3A.26   Hypertension - Chronic/Pre-existing (no        O10.019   meds)   Pre-existing diabetes, type 2, in pregnancy,   O24.112   second trimester (metformin, insulin, not   currently taking either)  ---------------------------------------------------------------------- Fetal Evaluation  Num Of Fetuses:         1  Fetal Heart Rate(bpm):  142  Cardiac Activity:       Observed  Presentation:           Cephalic  Amniotic Fluid  AFI FV:      Subjectively decreased  AFI Sum(cm)     %Tile       Largest Pocket(cm)  6.72            < 3         3.54  RUQ(cm)       RLQ(cm)       LUQ(cm)        LLQ(cm)  3.54          1.04          0.91           1.23 ---------------------------------------------------------------------- Biophysical Evaluation  Amniotic F.V:   Pocket => 2 cm two         F. Tone:        Observed                  planes  F. Movement:    Observed                   Score:          8/8  F. Breathing:   Observed ---------------------------------------------------------------------- OB History  Gravidity:    3         Term:   2        Prem:   0        SAB:   0  TOP:          0       Ectopic:  0        Living: 2 ---------------------------------------------------------------------- Gestational Age  LMP:           29w 0d        Date:  10/07/17                 EDD:   07/14/18  Best:          Altamese Cabal 3d     Det. By:  Marcella Dubs  EDD:   08/01/18                                      (12/22/17) ---------------------------------------------------------------------- Comments  U/S images reviewed. Findings reviewed with patient.   No  evidence of fetal compromise is found on BPP today.  No  fetal abnormalities are seen.  Mild oligohydramnios is present.    Questions answered.  Translator utilized.  10 minutes spent face to face with patient.  Recommendations: 1) Serial U/S every 4 weeks for fetal  growth  2) Twice weekly BPP  3) Delivery @ 34 weeks ---------------------------------------------------------------------- Recommendations   1) Serial U/S every 4 weeks for fetal growth  2) Twice weekly  BPP  3) Delivery @ 34 weeks ----------------------------------------------------------------------               Patsi Sears, MD Electronically Signed Final Report   04/28/2018 02:53 pm ----------------------------------------------------------------------  [place] Mfm Ob Follow Up  Result Date: 04/23/2018 ----------------------------------------------------------------------  OBSTETRICS REPORT                        (Signed Final 04/23/2018 10:17 am) ---------------------------------------------------------------------- Patient Info  ID #:       [id number]                          D.O.B.:  10/29/1990 (27 yrs)  Name:       Kathryn Shelton                  Visit Date: 04/23/2018 08:03 am ---------------------------------------------------------------------- Performed By  Performed By:     Birdena Crandall        Ref. Address:      [address]                    [place]                                                              Rd  Attending:        Lin Landsman      Secondary Phy.:    3rd Nursing- HR                    MD                                                              OB                                                              3rd Floor  Referred By:      Ivory Broad DAVIS          Location:          [facility]  MD ---------------------------------------------------------------------- Orders   #  Description                          Code         Ordered By   1  US MFM OB FOLLOW UP                  16109.6076816.01     KELLY DAVIS  ----------------------------------------------------------------------   #  Order #                     Accession #                 Episode #   1  [id number]                  [id number]  ---------------------------------------------------------------------- Indications   Premature rupture of membranes - leaking       O42.90   fluid   Hypertension - Chronic/Pre-existing (no        O10.019   meds)   Pre-existing diabetes, type 2, in pregnancy,   O24.112   second trimester (metformin, insulin, not   currently taking either)   [redacted] weeks gestation of pregnancy                Z3A.25  ---------------------------------------------------------------------- Fetal Evaluation  Num Of Fetuses:          1  Fetal Heart Rate(bpm):   131  Cardiac Activity:        Observed  Presentation:            Cephalic  Placenta:                Right lateral  Amniotic Fluid  AFI FV:      Oligohydramnios                              Largest Pocket(cm)                              3.08 ---------------------------------------------------------------------- Biometry  BPD:      64.2  mm     G. Age:  26w 0d         49  %    CI:        70.73   %    70 - 86                                                          FL/HC:       18.6  %    18.6 - 20.4  HC:      243.3  mm     G. Age:  26w 3d         51  %    HC/AC:       1.10       1.04 - 1.22  AC:      220.6  mm     G. Age:  26w 3d         66  %  FL/BPD:      70.4  %    71 - 87  FL:       45.2  mm     G. Age:  25w 0d         17  %    FL/AC:       20.5  %    20 - 24  HUM:      41.6  mm     G. Age:  25w 1d         29  %  LV:        7.6  mm  Est. FW:     868   gm   1 lb 15 oz      58  % ---------------------------------------------------------------------- OB History  Gravidity:    3         Term:   2        Prem:   0        SAB:   0  TOP:          0       Ectopic:  0        Living: 2 ---------------------------------------------------------------------- Gestational Age  LMP:           28w 2d        Date:  10/07/17                 EDD:   07/14/18  U/S Today:     26w 0d                                         EDD:   07/30/18  Best:          25w 5d     Det. ByMarcella Dubs         EDD:   08/01/18                                      (12/22/17) ---------------------------------------------------------------------- Anatomy  Ventricles:            Appears normal         Stomach:                Appears normal, left                                                                        sided  Cerebellum:            Appears normal         Kidneys:                Appear normal  Posterior Fossa:       Appears normal         Bladder:                Appears normal ---------------------------------------------------------------------- Cervix Uterus Adnexa  Cervix  Length:           3.84  cm.  Normal appearance by transabdominal scan. ---------------------------------------------------------------------- Impression  PPROM  Inpatient  MVP 3 however subjectively low fluid. ---------------------------------------------------------------------- Recommendations  Follow up per inpatient team. ----------------------------------------------------------------------               Lin Landsman, MD Electronically Signed Final Report   04/23/2018 10:17 am ----------------------------------------------------------------------  [place] Mfm Ob Limited  Result Date: 04/30/2018 ----------------------------------------------------------------------  OBSTETRICS REPORT                       (Signed Final 04/30/2018 10:41 am) ---------------------------------------------------------------------- Patient Info  ID #:       [id number]                          D.O.B.:  11/03/1990 (27 yrs)  Name:       Kathryn Shelton                  Visit Date: 04/30/2018 07:27 am ---------------------------------------------------------------------- Performed By  Performed By:     Vivien Rota        Ref. Address:     [address]                    RD[address]                                                             [address]  Attending:         Patsi Sears      Secondary Phy.:   3rd Nursing- [facility]                    MD                                                             OB                                                             3rd Floor  Referred By:      Conan Bowens          Location:         [facility]                    MD ---------------------------------------------------------------------- Orders   #  Description                          Code         Ordered By   1  [place] MFM OB LIMITED                    76815.01     Diamon Reddinger   2  [place] MFM FETAL BPP WO NON              76819.01     Shandell Jallow  STRESS  ----------------------------------------------------------------------   #  Order #                    Accession #                 Episode #   1  [id number]   2  [id number]  ---------------------------------------------------------------------- Indications   Premature rupture of membranes - leaking       O42.90   fluid   Hypertension - Chronic/Pre-existing (no        O10.019   meds)   Pre-existing diabetes, type 2, in pregnancy,   O24.112   second trimester (metformin, insulin, not   currently taking either)   [redacted] weeks gestation of pregnancy                Z3A.26  ---------------------------------------------------------------------- Fetal Evaluation  Num Of Fetuses:         1  Fetal Heart Rate(bpm):  142  Cardiac Activity:       Observed  Presentation:           Cephalic  Placenta:               Right lateral  Amniotic Fluid  AFI FV:      Subjectively decreased                              Largest Pocket(cm)                              3.51 ---------------------------------------------------------------------- Biophysical Evaluation  Amniotic F.V:   Pocket => 2 cm two         F. Tone:        Observed                  planes  F. Movement:    Observed                   Score:          8/8  F. Breathing:   Observed  ---------------------------------------------------------------------- OB History  Gravidity:    3         Term:   2        Prem:   0        SAB:   0  TOP:          0       Ectopic:  0        Living: 2 ---------------------------------------------------------------------- Gestational Age  LMP:           29w 2d        Date:  10/07/17                 EDD:   07/14/18  Best:          Altamese Cabal 5d     Det. ByMarcella Dubs         EDD:   08/01/18                                      (  12/22/17) ---------------------------------------------------------------------- Anatomy  Stomach:               Appears normal, left   Bladder:                Appears normal                         sided ---------------------------------------------------------------------- Cervix Uterus Adnexa  Cervix  Not visualized (advanced GA >24wks)  Uterus  No abnormality visualized.  Left Ovary  Not visualized.  Right Ovary  Not visualized.  Adnexa  No abnormality visualized. No adnexal mass  visualized. ---------------------------------------------------------------------- Comments  U/S images reviewed. Findings reviewed.  No evidence of  fetal compromise is found on BPP today.  No fetal  abnormalities are seen.   Recommendations: 1) Serial U/S every 4 weeks for fetal  growth  2) Twice weekly 3) Delivery @ 34 weeks ---------------------------------------------------------------------- Recommendations  1) Serial U/S every 4 weeks for fetal growth  2) Twice weekly  3) Delivery @ 34 weeks ----------------------------------------------------------------------               Patsi Sears, MD Electronically Signed Final Report   04/30/2018 10:41 am ----------------------------------------------------------------------    Medications:  Scheduled . aspirin EC  81 mg Oral Daily  . docusate sodium  100 mg Oral Daily  . insulin aspart  0-14 Units Subcutaneous TID PC  . insulin aspart  18 Units Subcutaneous TID WC  . insulin glargine  16 Units  Subcutaneous QHS  . insulin glargine  26 Units Subcutaneous QAC breakfast  . loratadine  10 mg Oral Daily  . prenatal multivitamin  1 tablet Oral Q1200   I have reviewed the patient's current medications.  ASSESSMENT: IUP 29 weeks PPROM DM CHTN  PLAN: Stable. Increased insulin around the clock for better coverage of BS after recent rescue BMZ administration. No S/Sx of infection Delivery at 34 weeks or for maternal/fetal indications. Continue with weekly BPP Continue routine antenatal care.   Jaynie Collins, MD 05/17/2018,7:20 AM

## 2018-05-17 NOTE — Progress Notes (Signed)
Inpatient Diabetes Program Recommendations  AACE/ADA: New Consensus Statement on Inpatient Glycemic Control (2015)  Target Ranges:  Prepandial:   less than 140 mg/dL      Peak postprandial:   less than 180 mg/dL (1-2 hours)      Critically ill patients:  140 - 180 mg/dL   Lab Results  Component Value Date   GLUCAP 127 (H) 05/17/2018   HGBA1C 8.2 (H) 01/14/2018    Review of Glycemic Control Results for [name], Kathryn (MRN [id number]) as of 05/17/2018 11:03  Ref. Range 05/16/2018 20:03 05/17/2018 09:37 05/17/2018 10:05  Glucose-Capillary Latest Ref Range: 70 - 99 mg/dL 254119 (H) 58 (L) 270127 (H)   Diabetes history:Type 2 DM Outpatient Diabetes medications:Lantus 10 units QHS Current orders for Inpatient glycemic control:Lantus16units QHS, Lantus 26units QAM, Novolog18units TID, Novolog 0-14 units TID  Inpatient Diabetes Program Recommendations:   Noted hypoglycemic event of 58 mg/dL this AM. Of note, on 62/3712/22, patient received 16 units of meal coverage and an additional dose of meal coverage and correction within 2 hours. Having this in addition to the bedtime Lantus may have contributed to lows this AM. Meal coverage should be given at least 4 hours apart.  No additional recs, will continue to follow.   Thanks, Lujean RaveLauren Anala Whisenant, MSN, RNC-OB Diabetes Coordinator [phone] (8a-5p)

## 2018-05-17 NOTE — Progress Notes (Addendum)
Hypoglycemic Event  CBG: 58 @ 0937  Treatment: Pt received breakfast tray.  Symptoms: none  Follow-up CBG: Time: 1005 CBG Result: 127  Possible Reasons for Event: late breakfast      Laury AxonJennifer K Dareth Andrew

## 2018-05-18 ENCOUNTER — Encounter (HOSPITAL_COMMUNITY): Payer: Self-pay

## 2018-05-18 LAB — CBC WITH DIFFERENTIAL/PLATELET
BASOS PCT: 0 %
Basophils Absolute: 0 10*3/uL (ref 0.0–0.1)
Eosinophils Absolute: 0 10*3/uL (ref 0.0–0.5)
Eosinophils Relative: 0 %
HCT: 30.1 % — ABNORMAL LOW (ref 36.0–46.0)
Hemoglobin: 9.9 g/dL — ABNORMAL LOW (ref 12.0–15.0)
Lymphocytes Relative: 11 %
Lymphs Abs: 1.6 10*3/uL (ref 0.7–4.0)
MCH: 27.3 pg (ref 26.0–34.0)
MCHC: 32.9 g/dL (ref 30.0–36.0)
MCV: 83.1 fL (ref 80.0–100.0)
Monocytes Absolute: 0.5 10*3/uL (ref 0.1–1.0)
Monocytes Relative: 3 %
Neutro Abs: 13 10*3/uL — ABNORMAL HIGH (ref 1.7–7.7)
Neutrophils Relative %: 86 %
Platelets: 276 10*3/uL (ref 150–400)
RBC: 3.62 MIL/uL — AB (ref 3.87–5.11)
RDW: 13.6 % (ref 11.5–15.5)
WBC: 15 10*3/uL — ABNORMAL HIGH (ref 4.0–10.5)
nRBC: 0 % (ref 0.0–0.2)

## 2018-05-18 LAB — COMPREHENSIVE METABOLIC PANEL
ALBUMIN: 2.5 g/dL — AB (ref 3.5–5.0)
ALT: 10 U/L (ref 0–44)
AST: 17 U/L (ref 15–41)
Alkaline Phosphatase: 74 U/L (ref 38–126)
Anion gap: 7 (ref 5–15)
BUN: 6 mg/dL (ref 6–20)
CO2: 21 mmol/L — ABNORMAL LOW (ref 22–32)
Calcium: 8.2 mg/dL — ABNORMAL LOW (ref 8.9–10.3)
Chloride: 103 mmol/L (ref 98–111)
Creatinine, Ser: 0.64 mg/dL (ref 0.44–1.00)
GFR calc Af Amer: >60 mL/min (ref >60)
GFR calc non Af Amer: >60 mL/min (ref >60)
GLUCOSE: 149 mg/dL — AB (ref 70–99)
Potassium: 3.4 mmol/L — ABNORMAL LOW (ref 3.5–5.1)
SODIUM: 131 mmol/L — AB (ref 135–145)
Total Bilirubin: 0.7 mg/dL (ref 0.3–1.2)
Total Protein: 6 g/dL — ABNORMAL LOW (ref 6.5–8.1)

## 2018-05-18 LAB — INFLUENZA PANEL BY PCR (TYPE A & B)
INFLBPCR: NEGATIVE
Influenza A By PCR: NEGATIVE

## 2018-05-18 LAB — TYPE AND SCREEN
ABO/RH(D): A POS
Antibody Screen: NEGATIVE

## 2018-05-18 LAB — GLUCOSE, CAPILLARY
Glucose-Capillary: 152 mg/dL — ABNORMAL HIGH (ref 70–99)
Glucose-Capillary: 238 mg/dL — ABNORMAL HIGH (ref 70–99)

## 2018-05-18 MED ORDER — POTASSIUM CHLORIDE CRYS ER 20 MEQ PO TBCR
30.0000 meq | EXTENDED_RELEASE_TABLET | Freq: Two times a day (BID) | ORAL | Status: DC
  Administered 2018-05-18 (×2): 30 meq via ORAL
  Filled 2018-05-18 (×5): qty 1

## 2018-05-18 MED ORDER — LACTATED RINGERS IV BOLUS
1000.0000 mL | Freq: Once | INTRAVENOUS | Status: AC
  Administered 2018-05-18: 1000 mL via INTRAVENOUS

## 2018-05-18 NOTE — Progress Notes (Signed)
OB Update Note  CBC Latest Ref Rng & Units 05/18/2018 05/05/2018 05/02/2018  WBC 4.0 - 10.5 K/uL 15.0(H) 6.5 6.3  Hemoglobin 12.0 - 15.0 g/dL 1.6(X9.9(L) 11.1(L) 10.5(L)  Hematocrit 36.0 - 46.0 % 30.1(L) 33.9(L) 31.6(L)  Platelets 150 - 400 K/uL 276 325 302     Current Vital Signs 24h Vital Sign Ranges  T 98.8 F (37.1 C) Temp  Avg: 99.3 F (37.4 C)  Min: 98.6 F (37 C)  Max: 100.1 F (37.8 C)  BP 96/66 BP  Min: 96/66  Max: 128/80  HR (!) 122 Pulse  Avg: 123.8  Min: 113  Max: 140  RR 18 Resp  Avg: 29  Min: 18  Max: 98  SaO2 98 % Room Air SpO2  Avg: 99.7 %  Min: 98 %  Max: 100 %       24 Hour I/O Current Shift I/O  Time Ins Outs No intake/output data recorded. No intake/output data recorded.   Patient Vitals for the past 24 hrs:  BP Temp Temp src Pulse Resp SpO2  05/18/18 1208 96/66 98.8 F (37.1 C) Oral (!) 122 18 98 %  05/18/18 0835 100/66 99.5 F (37.5 C) Oral (!) 124 18 100 %  05/18/18 0726 108/80 99.4 F (37.4 C) Oral (!) 127 20 100 %  05/18/18 0616 128/80 99.7 F (37.6 C) Oral (!) 124 20 100 %  05/18/18 0459 - - - - (!) 98 -  05/17/18 2230 - 99 F (37.2 C) Oral (!) 120 - -  05/17/18 2206 127/61 100.1 F (37.8 C) Oral (!) 140 20 100 %  05/17/18 1941 127/80 98.6 F (37 C) Oral (!) 120 20 100 %  05/17/18 1621 120/88 99.2 F (37.3 C) Oral (!) 113 18 100 %   Patient comfortable, negative ROS  NAD Obese, nttp SSE: +pooling of clear fluid, no smell, cx visually closed, no VB   Category I tracing with accels, toco negative Continue with close monitoring, no e/o chorio  Kathryn Shelton, Jr MD Attending Center [facility]) 05/18/2018 Time: 1330

## 2018-05-18 NOTE — Progress Notes (Signed)
FACULTY PRACTICE ANTEPARTUM PROGRESS NOTE  Kathryn SerOctavia Shelton is a 27 y.o. G3P2002 at 2116w2d who is admitted for PROM.  Estimated Date of Delivery: 08/01/18 Fetal presentation is cephalic.  Length of Stay:  46 Days. Admitted 04/02/2018  Subjective:  Patient reports normal fetal movement.  She denies uterine contractions, denies bleeding. Does report increased leaking of fluid per vagina and that is has become foul smelling overnight. Reports fever and chills overnight and that she is "very cold." Reports she feels ill and was not able to get any sleep.    Vitals:  Blood pressure 108/80, pulse (!) 127, temperature 99.4 F (37.4 C), temperature source Oral, resp. rate 20, height 5\' 6"  (1.676 m), weight 103.6 kg, last menstrual period 10/07/2017, SpO2 100 %. Physical Examination: CONSTITUTIONAL: Well-developed, well-nourished female in no mild distress. Appears ill  HENT:  Normocephalic, atraumatic, External right and left ear normal. Oropharynx is clear and moist EYES: Conjunctivae and EOM are normal. Pupils are equal, round, and reactive to light. No scleral icterus.  NECK: Normal range of motion, supple, no masses. SKIN: Skin is warm and dry. No rash noted. Not diaphoretic. No erythema. No pallor. NEUROLGIC: Alert and oriented to person, place, and time. Normal reflexes, muscle tone coordination. No cranial nerve deficit noted. PSYCHIATRIC: Normal mood and affect. Normal behavior. Normal judgment and thought content. CARDIOVASCULAR: Normal heart rate noted, regular rhythm RESPIRATORY: Effort and breath sounds normal, no problems with respiration noted MUSCULOSKELETAL: Normal range of motion. No edema and no tenderness. ABDOMEN: Soft, mildly diffusely tender with deep palpation, nondistended, gravid. CERVIX: deferred  Fetal monitoring: last pm FHR: 160 bpm, Variability: moderate, Accelerations: present, Decelerations: Absent  Uterine activity: no contractions per hour  Results for orders placed  or performed during the hospital encounter of 04/02/18 (from the past 48 hour(s))  Glucose, capillary     Status: Abnormal   Collection Time: 05/16/18  8:34 AM  Result Value Ref Range   Glucose-Capillary 120 (H) 70 - 99 mg/dL  Glucose, capillary     Status: Abnormal   Collection Time: 05/16/18 10:23 AM  Result Value Ref Range   Glucose-Capillary 171 (H) 70 - 99 mg/dL  Glucose, capillary     Status: Abnormal   Collection Time: 05/16/18  5:04 PM  Result Value Ref Range   Glucose-Capillary 167 (H) 70 - 99 mg/dL  Glucose, capillary     Status: Abnormal   Collection Time: 05/16/18  8:03 PM  Result Value Ref Range   Glucose-Capillary 119 (H) 70 - 99 mg/dL  Glucose, capillary     Status: Abnormal   Collection Time: 05/17/18  9:37 AM  Result Value Ref Range   Glucose-Capillary 58 (L) 70 - 99 mg/dL  Glucose, capillary     Status: Abnormal   Collection Time: 05/17/18 10:05 AM  Result Value Ref Range   Glucose-Capillary 127 (H) 70 - 99 mg/dL  Glucose, capillary     Status: Abnormal   Collection Time: 05/17/18 11:28 AM  Result Value Ref Range   Glucose-Capillary 126 (H) 70 - 99 mg/dL  Glucose, capillary     Status: Abnormal   Collection Time: 05/17/18  2:19 PM  Result Value Ref Range   Glucose-Capillary 143 (H) 70 - 99 mg/dL  Glucose, capillary     Status: Abnormal   Collection Time: 05/17/18 10:02 PM  Result Value Ref Range   Glucose-Capillary 200 (H) 70 - 99 mg/dL  Glucose, capillary     Status: Abnormal   Collection Time: 05/17/18 11:52  PM  Result Value Ref Range   Glucose-Capillary 164 (H) 70 - 99 mg/dL    No results found.  Current scheduled medications . aspirin EC  81 mg Oral Daily  . docusate sodium  100 mg Oral Daily  . insulin aspart  0-14 Units Subcutaneous TID PC  . insulin aspart  18 Units Subcutaneous TID WC  . insulin glargine  16 Units Subcutaneous QHS  . insulin glargine  26 Units Subcutaneous QAC breakfast  . loratadine  10 mg Oral Daily  . prenatal  multivitamin  1 tablet Oral Q1200    I have reviewed the patient's current medications.  ASSESSMENT: Principal Problem:   Preterm premature rupture of membranes, unspecified as to length of time between rupture and onset of labor, second trimester Active Problems:   Supervision of high risk pregnancy, antepartum   Chronic hypertension during pregnancy, antepartum   Pre-existing type 2 diabetes mellitus during pregnancy, antepartum   Oligohydramnios antepartum, second trimester, other fetus   PLAN: Patient with subjective fever, chills and foul smelling discharge overnight, mild uterine tenderness with deep palpation Will obtain CBC and flu swab Droplet precautions If no source of infection found, will consider chorio and proceed with induction Reviewed the above, patient is agreeable to plan   Continue routine antenatal care.   Baldemar LenisK. Meryl Erroll Wilbourne, M.D. Attending Center for [facility]  05/18/2018 8:23 AM

## 2018-05-19 ENCOUNTER — Inpatient Hospital Stay (HOSPITAL_COMMUNITY): Payer: Medicaid Other | Admitting: Anesthesiology

## 2018-05-19 DIAGNOSIS — O24113 Pre-existing diabetes mellitus, type 2, in pregnancy, third trimester: Secondary | ICD-10-CM

## 2018-05-19 DIAGNOSIS — Z3A29 29 weeks gestation of pregnancy: Secondary | ICD-10-CM

## 2018-05-19 LAB — CBC
HEMATOCRIT: 36.1 % (ref 36.0–46.0)
Hemoglobin: 12 g/dL (ref 12.0–15.0)
MCH: 27.8 pg (ref 26.0–34.0)
MCHC: 33.2 g/dL (ref 30.0–36.0)
MCV: 83.8 fL (ref 80.0–100.0)
Platelets: 286 10*3/uL (ref 150–400)
RBC: 4.31 MIL/uL (ref 3.87–5.11)
RDW: 13.3 % (ref 11.5–15.5)
WBC: 15.7 10*3/uL — ABNORMAL HIGH (ref 4.0–10.5)
nRBC: 0 % (ref 0.0–0.2)

## 2018-05-19 LAB — GLUCOSE, CAPILLARY
Glucose-Capillary: 103 mg/dL — ABNORMAL HIGH (ref 70–99)
Glucose-Capillary: 118 mg/dL — ABNORMAL HIGH (ref 70–99)
Glucose-Capillary: 172 mg/dL — ABNORMAL HIGH (ref 70–99)
Glucose-Capillary: 75 mg/dL (ref 70–99)
Glucose-Capillary: 95 mg/dL (ref 70–99)

## 2018-05-19 MED ORDER — INSULIN ASPART 100 UNIT/ML ~~LOC~~ SOLN
0.0000 [IU] | Freq: Three times a day (TID) | SUBCUTANEOUS | Status: DC
  Administered 2018-05-20: 2 [IU] via SUBCUTANEOUS
  Administered 2018-05-20: 10 [IU] via SUBCUTANEOUS

## 2018-05-19 MED ORDER — FENTANYL CITRATE (PF) 100 MCG/2ML IJ SOLN: 100.0000 ug | Freq: Once | INTRAMUSCULAR | Status: DC

## 2018-05-19 MED ORDER — LACTATED RINGERS IV SOLN
INTRAVENOUS | Status: DC
  Administered 2018-05-19: 20:00:00 via INTRAVENOUS

## 2018-05-19 MED ORDER — INSULIN ASPART 100 UNIT/ML ~~LOC~~ SOLN: 20.0000 [IU] | Freq: Three times a day (TID) | SUBCUTANEOUS | Status: DC

## 2018-05-19 MED ORDER — OXYTOCIN BOLUS FROM INFUSION
500.0000 mL | Freq: Once | INTRAVENOUS | Status: AC
  Administered 2018-05-19: 500 mL via INTRAVENOUS

## 2018-05-19 MED ORDER — PHENYLEPHRINE 40 MCG/ML (10ML) SYRINGE FOR IV PUSH (FOR BLOOD PRESSURE SUPPORT)
80.0000 ug | PREFILLED_SYRINGE | INTRAVENOUS | Status: DC | PRN
  Filled 2018-05-19 (×2): qty 10

## 2018-05-19 MED ORDER — LACTATED RINGERS IV SOLN: 500.0000 mL | INTRAVENOUS | Status: DC | PRN

## 2018-05-19 MED ORDER — INSULIN GLARGINE 100 UNIT/ML ~~LOC~~ SOLN
18.0000 [IU] | Freq: Every day | SUBCUTANEOUS | Status: DC
  Filled 2018-05-19: qty 0.18

## 2018-05-19 MED ORDER — OXYTOCIN 40 UNITS IN LACTATED RINGERS INFUSION - SIMPLE MED
1.0000 m[IU]/min | INTRAVENOUS | Status: DC
  Administered 2018-05-19: 2 m[IU]/min via INTRAVENOUS
  Filled 2018-05-19: qty 1000

## 2018-05-19 MED ORDER — SIMETHICONE 80 MG PO CHEW: 80.0000 mg | CHEWABLE_TABLET | ORAL | Status: DC | PRN

## 2018-05-19 MED ORDER — LIDOCAINE HCL (PF) 1 % IJ SOLN
INTRAMUSCULAR | Status: DC | PRN
  Administered 2018-05-19 (×2): 4 mL via EPIDURAL

## 2018-05-19 MED ORDER — LIDOCAINE HCL (PF) 1 % IJ SOLN
30.0000 mL | INTRAMUSCULAR | Status: DC | PRN
  Filled 2018-05-19: qty 30

## 2018-05-19 MED ORDER — INSULIN GLARGINE 100 UNIT/ML ~~LOC~~ SOLN
7.0000 [IU] | Freq: Every day | SUBCUTANEOUS | Status: DC
  Administered 2018-05-20 (×2): 7 [IU] via SUBCUTANEOUS
  Filled 2018-05-19 (×3): qty 0.07

## 2018-05-19 MED ORDER — PHENYLEPHRINE 40 MCG/ML (10ML) SYRINGE FOR IV PUSH (FOR BLOOD PRESSURE SUPPORT)
80.0000 ug | PREFILLED_SYRINGE | INTRAVENOUS | Status: AC | PRN
  Administered 2018-05-19 (×3): 80 ug via INTRAVENOUS

## 2018-05-19 MED ORDER — WITCH HAZEL-GLYCERIN EX PADS: 1.0000 "application " | MEDICATED_PAD | CUTANEOUS | Status: DC | PRN

## 2018-05-19 MED ORDER — DIPHENHYDRAMINE HCL 25 MG PO CAPS: 25.0000 mg | ORAL_CAPSULE | Freq: Four times a day (QID) | ORAL | Status: DC | PRN

## 2018-05-19 MED ORDER — PIPERACILLIN-TAZOBACTAM 3.375 G IVPB 30 MIN
3.3750 g | Freq: Once | INTRAVENOUS | Status: AC
  Administered 2018-05-19: 3.375 g via INTRAVENOUS
  Filled 2018-05-19 (×2): qty 50

## 2018-05-19 MED ORDER — OXYTOCIN 40 UNITS IN LACTATED RINGERS INFUSION - SIMPLE MED
2.5000 [IU]/h | INTRAVENOUS | Status: DC
  Administered 2018-05-19: 2.5 [IU]/h via INTRAVENOUS

## 2018-05-19 MED ORDER — TETANUS-DIPHTH-ACELL PERTUSSIS 5-2.5-18.5 LF-MCG/0.5 IM SUSP
0.5000 mL | Freq: Once | INTRAMUSCULAR | Status: AC
  Administered 2018-05-20: 0.5 mL via INTRAMUSCULAR
  Filled 2018-05-19: qty 0.5

## 2018-05-19 MED ORDER — DIBUCAINE 1 % RE OINT: 1.0000 "application " | TOPICAL_OINTMENT | RECTAL | Status: DC | PRN

## 2018-05-19 MED ORDER — ACETAMINOPHEN 500 MG PO TABS: 1000.0000 mg | ORAL_TABLET | Freq: Four times a day (QID) | ORAL | Status: DC | PRN

## 2018-05-19 MED ORDER — EPHEDRINE 5 MG/ML INJ
10.0000 mg | INTRAVENOUS | Status: DC | PRN
  Filled 2018-05-19: qty 2

## 2018-05-19 MED ORDER — GENTAMICIN SULFATE 40 MG/ML IJ SOLN
1.5000 mg/kg | Freq: Three times a day (TID) | INTRAVENOUS | Status: DC
  Filled 2018-05-19 (×2): qty 4

## 2018-05-19 MED ORDER — FENTANYL CITRATE (PF) 100 MCG/2ML IJ SOLN: 100.0000 ug | INTRAMUSCULAR | Status: DC | PRN

## 2018-05-19 MED ORDER — INSULIN GLARGINE 100 UNIT/ML ~~LOC~~ SOLN
28.0000 [IU] | Freq: Every day | SUBCUTANEOUS | Status: DC
  Administered 2018-05-19: 28 [IU] via SUBCUTANEOUS
  Filled 2018-05-19: qty 0.28

## 2018-05-19 MED ORDER — ONDANSETRON HCL 4 MG PO TABS: 4.0000 mg | ORAL_TABLET | ORAL | Status: DC | PRN

## 2018-05-19 MED ORDER — BENZOCAINE-MENTHOL 20-0.5 % EX AERO: 1.0000 "application " | INHALATION_SPRAY | CUTANEOUS | Status: DC | PRN

## 2018-05-19 MED ORDER — ACETAMINOPHEN 500 MG PO TABS: 1000.0000 mg | ORAL_TABLET | Freq: Once | ORAL | Status: DC

## 2018-05-19 MED ORDER — TERBUTALINE SULFATE 1 MG/ML IJ SOLN: 0.2500 mg | Freq: Once | INTRAMUSCULAR | Status: DC | PRN

## 2018-05-19 MED ORDER — IBUPROFEN 600 MG PO TABS
600.0000 mg | ORAL_TABLET | Freq: Four times a day (QID) | ORAL | Status: DC
  Administered 2018-05-20 – 2018-05-21 (×6): 600 mg via ORAL
  Filled 2018-05-19 (×7): qty 1

## 2018-05-19 MED ORDER — INSULIN ASPART 100 UNIT/ML ~~LOC~~ SOLN
10.0000 [IU] | Freq: Three times a day (TID) | SUBCUTANEOUS | Status: DC
  Administered 2018-05-20 – 2018-05-21 (×3): 10 [IU] via SUBCUTANEOUS

## 2018-05-19 MED ORDER — SODIUM CHLORIDE 0.9 % IV SOLN
2.0000 g | Freq: Four times a day (QID) | INTRAVENOUS | Status: DC
  Administered 2018-05-19: 2 g via INTRAVENOUS
  Filled 2018-05-19: qty 2000
  Filled 2018-05-19: qty 2

## 2018-05-19 MED ORDER — DIPHENHYDRAMINE HCL 50 MG/ML IJ SOLN: 12.5000 mg | INTRAMUSCULAR | Status: DC | PRN

## 2018-05-19 MED ORDER — LACTATED RINGERS IV SOLN: 500.0000 mL | Freq: Once | INTRAVENOUS | Status: DC

## 2018-05-19 MED ORDER — MEASLES, MUMPS & RUBELLA VAC IJ SOLR
0.5000 mL | Freq: Once | INTRAMUSCULAR | Status: DC
  Filled 2018-05-19: qty 0.5

## 2018-05-19 MED ORDER — INSULIN REGULAR(HUMAN) IN NACL 100-0.9 UT/100ML-% IV SOLN: INTRAVENOUS | Status: DC

## 2018-05-19 MED ORDER — INSULIN ASPART 100 UNIT/ML ~~LOC~~ SOLN: 0.0000 [IU] | Freq: Every day | SUBCUTANEOUS | Status: DC

## 2018-05-19 MED ORDER — SOD CITRATE-CITRIC ACID 500-334 MG/5ML PO SOLN: 30.0000 mL | ORAL | Status: DC | PRN

## 2018-05-19 MED ORDER — COCONUT OIL OIL: 1.0000 "application " | TOPICAL_OIL | Status: DC | PRN

## 2018-05-19 MED ORDER — ACETAMINOPHEN 325 MG PO TABS
650.0000 mg | ORAL_TABLET | ORAL | Status: DC | PRN
  Filled 2018-05-19: qty 2

## 2018-05-19 MED ORDER — SENNOSIDES-DOCUSATE SODIUM 8.6-50 MG PO TABS
2.0000 | ORAL_TABLET | ORAL | Status: DC
  Administered 2018-05-20 – 2018-05-21 (×2): 2 via ORAL
  Filled 2018-05-19 (×2): qty 2

## 2018-05-19 MED ORDER — ONDANSETRON HCL 4 MG/2ML IJ SOLN: 4.0000 mg | INTRAMUSCULAR | Status: DC | PRN

## 2018-05-19 MED ORDER — INSULIN GLARGINE 100 UNIT/ML ~~LOC~~ SOLN
14.0000 [IU] | Freq: Every day | SUBCUTANEOUS | Status: DC
  Administered 2018-05-20 – 2018-05-21 (×2): 14 [IU] via SUBCUTANEOUS
  Filled 2018-05-19 (×2): qty 0.14

## 2018-05-19 MED ORDER — PRENATAL MULTIVITAMIN CH
1.0000 | ORAL_TABLET | Freq: Every day | ORAL | Status: DC
  Administered 2018-05-20: 1 via ORAL
  Filled 2018-05-19: qty 1

## 2018-05-19 MED ORDER — LACTATED RINGERS IV BOLUS: 1000.0000 mL | Freq: Once | INTRAVENOUS | Status: DC

## 2018-05-19 MED ORDER — ONDANSETRON HCL 4 MG/2ML IJ SOLN: 4.0000 mg | Freq: Four times a day (QID) | INTRAMUSCULAR | Status: DC | PRN

## 2018-05-19 MED ORDER — FENTANYL 2.5 MCG/ML BUPIVACAINE 1/10 % EPIDURAL INFUSION (WH - ANES)
14.0000 mL/h | INTRAMUSCULAR | Status: DC | PRN
  Administered 2018-05-19: 14 mL/h via EPIDURAL
  Filled 2018-05-19: qty 100

## 2018-05-19 NOTE — Progress Notes (Signed)
Patient called out and she has urinated on the bed and the floor. "I couldn't get there because of these monitors". Monitors were on the floor.

## 2018-05-19 NOTE — Progress Notes (Signed)
Pt continues to c/o contractions.  [place] gave orders to check pt.

## 2018-05-19 NOTE — Progress Notes (Signed)
Patient c/o "bunch of fluid came out"  Upon assessment, small amount of yellow tinged fluid noted on pad.

## 2018-05-19 NOTE — Consult Note (Signed)
[facility]  Delivery Note:  SVD    05/19/2018  9:39 PM  I was called to the delivery room at the request of the patient's obstetrician (Dr. Vergie LivingPickens) for delivery of a 29 week infant.  PRENATAL HX:  This is a 89 y/o G3P2002 at 229 and 3/[redacted] weeks gestation who was admitted [redacted] weeks gestation for PPROM that occurred at 18 weeks.  She was started on latency antibiotics, received BMZ on 11/8 and 11/9, then again on 12/17 and 12/18.  She also received magnesium.  Her pregnancy is also complicated by Type 2 DM, insulin dependent.  She has now developed chorioamnionitis with a precipitous vaginal delivery.    DELIVERY:  Infant was vigorous at delivery, so cord clamping was delayed for 60 seconds.  She developed apnea after placing on warmer so PPV was initiated at about 2 mintues of age for 30 seconds with immediate improvement in HR from 60s to 120s.  CPAP then applied and while infant maintained a good HR and tone, O2 saturations remained in 70s by 10 minutes despite CPAP +5, 100% FiO2.  She was intubated on the 2nd attempt (first attempt was with a 3.0 ETT and it would not pass without pressure) with a 2.5 ETT at 14 minutes of age.  O2 saturations improved to low 90s on 25/5. 100%.  Surfactant administered at 21 minutes of age, with continued improvement in oxygenation.  She was transferred to NICU intubated on 80% FiO2.    APGARs 8 and 8.   _____________________ Electronically Signed By: Maryan CharLindsey Seylah Wernert, MD Neonatologist

## 2018-05-19 NOTE — Anesthesia Pain Management Evaluation Note (Signed)
  CRNA Pain Management Visit Note  Patient: Kathryn SerOctavia Shelton, [age] y.o., female  "Hello [facility]. We have an anesthesia team available at all times to provide care throughout the hospital, including epidural management and anesthesia for C-section. I don't know your plan for the delivery whether it a natural birth, water birth, IV sedation, nitrous supplementation, doula or epidural, but we want to meet your pain goals."   1.Was your pain managed to your expectations on prior hospitalizations?   Yes   2.What is your expectation for pain management during this hospitalization?     Epidural  3.How can we help you reach that goal? epidural  Record the patient's initial score and the patient's pain goal.   Pain: 9  Pain Goal: 10 [facility] you to be able to say your pain was always managed very well.  Xzavien Harada 05/19/2018

## 2018-05-19 NOTE — Progress Notes (Signed)
Pt called out c/o "sharp pain" in pelvis.  TOCO and US applied.  Ctx palpated mild.

## 2018-05-19 NOTE — Progress Notes (Signed)
Lab called this RN to report patient would not allow them to draw blood at this time.

## 2018-05-19 NOTE — Progress Notes (Signed)
Patient continues to remove fetal heart monitor and toco.  Also removes blood pressure cuff.  States "it bothers me".

## 2018-05-19 NOTE — Progress Notes (Signed)
Attempt x 2 to start IV.

## 2018-05-19 NOTE — Progress Notes (Signed)
CRNA at bedside.  Attempted IV start. Unsuccessful and patient requested "give me a break, you've stuck me four times". Told patient that we will wait and attempt IV start in an hour.

## 2018-05-19 NOTE — Anesthesia Preprocedure Evaluation (Signed)
Anesthesia Evaluation  Patient identified by MRN, date of birth, ID band Patient awake    Reviewed: Allergy & Precautions, NPO status , Patient's Chart, lab work & pertinent test results  Airway Mallampati: II  TM Distance: >3 FB Neck ROM: Full    Dental  (+) Dental Advisory Given   Pulmonary sleep apnea ,    Pulmonary exam normal breath sounds clear to auscultation       Cardiovascular hypertension, negative cardio ROS Normal cardiovascular exam Rhythm:Regular Rate:Normal     Neuro/Psych PSYCHIATRIC DISORDERS Anxiety negative neurological ROS     GI/Hepatic Neg liver ROS, GERD  ,  Endo/Other  diabetes, Gestational  Renal/GU negative Renal ROS     Musculoskeletal negative musculoskeletal ROS (+)   Abdominal (+) + obese,   Peds  Hematology negative hematology ROS (+)   Anesthesia Other Findings   Reproductive/Obstetrics (+) Pregnancy                            Anesthesia Physical Anesthesia Plan  ASA: III  Anesthesia Plan: Epidural   Post-op Pain Management:    Induction:   PONV Risk Score and Plan:   Airway Management Planned:   Additional Equipment:   Intra-op Plan:   Post-operative Plan:   Informed Consent: I have reviewed the patients History and Physical, chart, labs and discussed the procedure including the risks, benefits and alternatives for the proposed anesthesia with the patient or authorized representative who has indicated his/her understanding and acceptance.     Plan Discussed with:   Anesthesia Plan Comments:         Anesthesia Quick Evaluation

## 2018-05-19 NOTE — Progress Notes (Signed)
CRNA called to assist with IV start.

## 2018-05-19 NOTE — Progress Notes (Signed)
Difficult to keep fetal heart and toco monitors on patient due to her removing and/or moving in bed and monitors are being displaced. Reiterated to patient that it is important to leave monitors applied. Patient states understanding.

## 2018-05-19 NOTE — Anesthesia Procedure Notes (Signed)
Epidural Patient location during procedure: OB  Staffing Anesthesiologist: Lewie LoronGermeroth, Julieana Eshleman, MD Performed: anesthesiologist   Preanesthetic Checklist Completed: patient identified, pre-op evaluation, timeout performed, IV checked, risks and benefits discussed and monitors and equipment checked  Epidural Patient position: sitting Prep: site prepped and draped and DuraPrep Patient monitoring: heart rate, continuous pulse ox and blood pressure Approach: midline Location: L2-L3 Injection technique: LOR air and LOR saline  Needle:  Needle type: Tuohy  Needle gauge: 17 G Needle length: 9 cm Needle insertion depth: 9 cm Catheter type: closed end flexible Catheter size: 19 Gauge Catheter at skin depth: 15 cm Test dose: negative  Assessment Sensory level: T8 Events: blood not aspirated, injection not painful, no injection resistance, negative IV test and no paresthesia  Additional Notes Reason for block:procedure for pain

## 2018-05-19 NOTE — Progress Notes (Signed)
Daily Antepartum Note  Admission Date: 04/02/2018 Current Date: 05/19/2018 6:59 AM  Kathryn Shelton is a 27 y.o. [id number] @ 7727w3d, admitted for PPROM (since 17wks).  Pregnancy complicated by: Patient Active Problem List   Diagnosis Date Noted  . Oligohydramnios antepartum, second trimester, other fetus 03/09/2018  . Obesity, morbid (HCC) 03/09/2018  . Preterm premature rupture of membranes, unspecified as to length of time between rupture and onset of labor, second trimester 03/09/2018  . Supervision of high risk pregnancy, antepartum 12/31/2017  . Chronic hypertension during pregnancy, antepartum 12/31/2017  . Pre-existing type 2 diabetes mellitus during pregnancy, antepartum 12/31/2017  . Mixed hyperlipidemia 12/02/2017  . Type II diabetes mellitus, uncontrolled (HCC) 03/01/2017  . Essential hypertension 03/01/2017  . Intermittent palpitations 03/01/2017  . Panic attack 03/01/2017  . Obstructive sleep apnea syndrome 03/01/2017    Overnight/24hr events:  none  Subjective:  No s/s of infection, PTL  Objective:    Current Vital Signs 24h Vital Sign Ranges  T 99.9 F (37.7 C) Temp  Avg: 99.4 F (37.4 C)  Min: 98.8 F (37.1 C)  Max: 99.9 F (37.7 C)  BP 98/74 BP  Min: 96/66  Max: 138/96  HR (!) 116 Pulse  Avg: 122.7  Min: 114  Max: 132  RR 18 Resp  Avg: 18.9  Min: 18  Max: 20  SaO2 99 % Room Air SpO2  Avg: 99.3 %  Min: 98 %  Max: 100 %       24 Hour I/O Current Shift I/O  Time Ins Outs No intake/output data recorded. No intake/output data recorded.   EFM: 150 baseline, + accels, one slight variable decel, mod variability Toco: quiet x 633m  Physical exam: General: Well nourished, well developed female in no acute distress. Abdomen: gravid nttp Cardiovascular: S1, S2 normal, no murmur, rub or gallop, regular rate and rhythm Respiratory: CTAB Extremities: no clubbing, cyanosis or edema Skin: Warm and dry.   Medications: Current Facility-Administered Medications   Medication Dose Route Frequency Provider Last Rate Last Dose  . 0.9 %  sodium chloride infusion   Intravenous PRN Conan Bowensavis, Kelly M, MD   Stopped at 04/05/18 [phone]  . acetaminophen (TYLENOL) tablet 650 mg  650 mg Oral Q4H PRN Conan Bowensavis, Kelly M, MD   650 mg at 05/18/18 2039  . aspirin EC tablet 81 mg  81 mg Oral Daily Conan Bowensavis, Kelly M, MD   81 mg at 05/18/18 [id number]  . calcium carbonate (TUMS - dosed in mg elemental calcium) chewable tablet 400 mg of elemental calcium  2 tablet Oral Q4H PRN Conan Bowensavis, Kelly M, MD   400 mg of elemental calcium at 05/17/18 1735  . docusate sodium (COLACE) capsule 100 mg  100 mg Oral Daily Conan Bowensavis, Kelly M, MD   100 mg at 05/18/18 [id number]  . insulin aspart (novoLOG) injection 0-14 Units  0-14 Units Subcutaneous TID PC Anyanwu, Jethro BastosUgonna A, MD   4 Units at 05/18/18 2039  . insulin aspart (novoLOG) injection 18 Units  18 Units Subcutaneous TID WC Anyanwu, Jethro BastosUgonna A, MD   18 Units at 05/18/18 1829  . insulin glargine (LANTUS) injection 16 Units  16 Units Subcutaneous QHS Anyanwu, Jethro BastosUgonna A, MD   16 Units at 05/18/18 2239  . insulin glargine (LANTUS) injection 26 Units  26 Units Subcutaneous QAC breakfast Anyanwu, Jethro BastosUgonna A, MD   26 Units at 05/18/18 0829  . loratadine (CLARITIN) tablet 10 mg  10 mg Oral Daily Tilda BurrowFerguson, John V, MD   10 mg at 05/18/18  [id number]  . ondansetron (ZOFRAN-ODT) disintegrating tablet 4 mg  4 mg Oral Q6H PRN [place] BingPickens, Kassadi Presswood, MD   4 mg at 05/11/18 0855  . potassium chloride (K-DUR,KLOR-CON) CR tablet 30 mEq  30 mEq Oral BID [place] BingPickens, Evonna Stoltz, MD   30 mEq at 05/18/18 2238  . prenatal multivitamin tablet 1 tablet  1 tablet Oral Q1200 Conan Bowensavis, Kelly M, MD   1 tablet at 05/18/18 1219  . sodium chloride (OCEAN) 0.65 % nasal spray 1 spray  1 spray Each Nare PRN Tilda BurrowFerguson, John V, MD   1 spray at 05/02/18 0932    Labs:  Results for Kathryn Shelton, Kathryn Shelton (MRN [id number]) as of 05/19/2018 07:49  Ref. Range 05/17/2018 14:19 05/17/2018 22:02 05/17/2018 23:52 05/18/2018 08:23 05/18/2018 20:38   Glucose-Capillary Latest Ref Range: 70 - 99 mg/dL 644143 (H) 034200 (H) 742164 (H) 152 (H) 238 (H)   Radiology:  12/20: ceph, afi 5.2, bpp 8/8 11/29: ceph, efw 58%, 868gm, ac 66%  Assessment & Plan:  Pt doing well *Pregnancy: reactive NST. Routine care. -not sure about BTL *DM2: continue current regimen. Will increase to lantus 28/18 and aspart 20 tidac. Continue with qwk bpp (next due 12/27) *Preterm: no current issues. Pt amenable to rescue bmz -s/p NICU consult -s/p BMZ 11/8 and 11/9 -s/p Mg on admission *PPROM: no current issues -Last exam: 12/24: clear LOF, visually closed, no VB -s/p latency abx *PPx: SCDs, OOB *FEN/GI: pt prefers regular diet. *Dispo: pending delivery at 34wks  Kathryn Copaharlie Pranika Shelton, Jr. MD Attending Center for [facility](Faculty Practice)

## 2018-05-20 ENCOUNTER — Encounter (HOSPITAL_COMMUNITY): Payer: Self-pay | Admitting: *Deleted

## 2018-05-20 LAB — GLUCOSE, CAPILLARY
Glucose-Capillary: 118 mg/dL — ABNORMAL HIGH (ref 70–99)
Glucose-Capillary: 127 mg/dL — ABNORMAL HIGH (ref 70–99)
Glucose-Capillary: 72 mg/dL (ref 70–99)
Glucose-Capillary: 157 mg/dL — ABNORMAL HIGH (ref 70–99)

## 2018-05-20 NOTE — Lactation Note (Signed)
This note was copied from a baby's chart. Lactation Consultation Note: Initial visit with mom Baby now 3911 hours old 5629 w 4 d baby in NICU. Mom has DEBP setup in room and reports she has pumped once. Did not obtain any Colostrum. Encouragement given. Encouraged to pump q 3 hours- 8 times/24 hours to promote a good milk supply for baby in the NICU with hand expression afterwards. Did not breast feed her other babies. Has just visited baby in the NICU- encouraged to pump after visiting baby. I attempted to reviewed hand expression with mom but she states she knows how and will try later, Filling out birth certificate information. Reports she has had WIC in the past. [facility]. Given BF brochure, reviewed our phone number to call for support after DC. NICU booklet also given to mom. No questions at present. To call prn  Patient Name: Kathryn Shelton ZOXWR'UToday's Date: 05/20/2018 Reason for consult: Initial assessment;NICU baby;Preterm <34wks   Maternal Data Has patient been taught Hand Expression?: Yes(mom states she knows how to do it) Does the patient have breastfeeding experience prior to this delivery?: No  Feeding    LATCH Score                   Interventions    Lactation Tools Discussed/Used WIC Program: Yes(has had it in the past) Pump Review: (mom states her nurse showed her and she does not need a review) Initiated by:: RN   Consult Status Consult Status: Follow-up Date: 05/21/18 Follow-up type: In-patient    Pamelia HoitWeeks, [name] 05/20/2018, 8:57 AM

## 2018-05-20 NOTE — Anesthesia Postprocedure Evaluation (Signed)
Anesthesia Post Note  Patient: Kathryn Shelton  Procedure(s) Performed: AN AD HOC LABOR EPIDURAL     Patient location during evaluation: Mother Baby Anesthesia Type: Epidural Level of consciousness: awake and alert Pain management: pain level controlled Vital Signs Assessment: post-procedure vital signs reviewed and stable Respiratory status: spontaneous breathing, nonlabored ventilation and respiratory function stable Cardiovascular status: stable Postop Assessment: no headache, no backache and epidural receding Anesthetic complications: no    Last Vitals:  Vitals:   05/20/18 0030 05/20/18 0410  BP: 105/75 118/90  Pulse: (!) 139 85  Resp: 20 19  Temp: 37.6 C (!) 36.4 C  SpO2: 99% 98%    Last Pain:  Vitals:   05/20/18 0633  TempSrc:   PainSc: 0-No pain   Pain Goal: Patients Stated Pain Goal: 3 (05/20/18 [id number])               Rica RecordsICKELTON,Odeth Bry

## 2018-05-20 NOTE — Progress Notes (Signed)
Post Partum Day 1 Subjective: no complaints, up ad lib, voiding, tolerating PO and + flatus  Objective: Blood pressure 118/90, pulse 85, temperature (!) 97.5 F (36.4 C), temperature source Oral, resp. rate 19, height 5\' 6"  (1.676 m), weight 103.6 kg, last menstrual period 10/07/2017, SpO2 98 %, unknown if currently breastfeeding.  Physical Exam:  General: alert, cooperative and no distress Lochia: appropriate Uterine Fundus: non tender Incision:  DVT Evaluation: No evidence of DVT seen on physical exam.  Recent Labs    05/18/18 0846 05/19/18 1844  HGB 9.9* 12.0  HCT 30.1* 36.1    Assessment/Plan: Plan for discharge tomorrow and Contraception discuss  CBG acceptable On 1/2 dose of insulin   LOS: 48 days   Lazaro ArmsLuther H Eure 05/20/2018, 8:03 AM

## 2018-05-21 LAB — GLUCOSE, CAPILLARY: GLUCOSE-CAPILLARY: 88 mg/dL (ref 70–99)

## 2018-05-21 MED ORDER — METFORMIN HCL 1000 MG PO TABS: 1000.0000 mg | ORAL_TABLET | Freq: Two times a day (BID) | ORAL | 1 refills | Status: DC

## 2018-05-21 MED ORDER — IBUPROFEN 600 MG PO TABS: 600.0000 mg | ORAL_TABLET | Freq: Four times a day (QID) | ORAL | 0 refills | Status: DC | PRN

## 2018-05-21 NOTE — Clinical Social Work Maternal (Signed)
CLINICAL SOCIAL WORK MATERNAL/CHILD NOTE  Patient Details  Name: Kathryn Shelton MRN: [id number] Date of Birth: November 18, 1990  Date:  05/21/2018  Clinical Social Worker Initiating Note:  Celso SickleKimberly Sya Nestler, [place] Date/Time: Initiated:  05/20/18/1320     Child's Name:  Kathryn Shelton   Biological Parents:  Mother(Father - unknown and will not be involved)   Need for Interpreter:  None   Reason for Referral:  Parental Support of Premature Babies < 32 weeks/or Critically Ill babies, Other (Comment)(Hx of CPS involvement )   Address:  [address] [place] [id number]    Phone number:  [phone] (home)     Additional phone number:   Household Members/Support Persons (HM/SP):       HM/SP Name Relationship DOB or Age  HM/SP -1        HM/SP -2        HM/SP -3        HM/SP -4        HM/SP -5        HM/SP -6        HM/SP -7        HM/SP -8          Natural Supports (not living in the home):  Extended Family, Garment/textile technologistCommunity, Friends, Spouse/significant other   Professional Supports: Case Research officer, political partyManager/Social Worker, Other (Comment)([place] [employer] [facility] Case Manager (Deatra RobinsonKara McAvoy);; On the waitlist for [employer] home based parenting classes;; [facility](Halicia Janee Mornhompson [phone]))   Employment: Unemployed   Type of Work:     Education:  Engineer, agriculturalHigh school graduate   Homebound arranged: No  Financial Resources:  [employer]icaid, SSI/Disability   Other Resources:  Sales executiveood Stamps ([facility])   Cultural/Religious Considerations Which May Impact Care:    Strengths:  Ability to meet basic needs , Home prepared for child    Psychotropic Medications:         Pediatrician:       [place]    [place]    [place]    [place]      Pediatrician Fax Number:    Risk Factors/Current Problems:  Other (Comment)(CPS hx)   Cognitive State:  Able to Concentrate , Alert , Linear  Thinking , Goal Oriented    Mood/Affect:  Interested , Calm , Happy    CSW Assessment: CSW spoke with MOB at bedside and completed psychosocial assessment, boyfriend present. MOB granted permission for boyfriend to stay in room during assessment. MOB reported that she resides alone and has a lot of support. MOB reported that her supports are her sister Logan Bores(Jasmine Bostic [phone]), her godmother Clarene Critchley(Elizabeth Hutson-Ruff [phone]), her baby's godmother/friend Justin Mend(Sheree Earlene PlaterDavis [phone]), her boyfriend ("TJ" Eino Farberhurman Hadley), [facility] Case Manager (Deatra RobinsonKara McAvoy) and [employer] [facility] staff [facility](Halicia Janee Mornhompson [phone]). MOB reported that she also has all items at home for the baby and that her case manager is helping her get a car seat for the baby. MOB reported that she plans [facility] and will ask them about supplying her with a breast pump.   CSW and MOB discussed NICU admission. MOB reported that her daughter will be here for about 8-10 weeks. CSW and MOB discussed how hard it will be emotionally to leave her when MOB is discharged, MOB reported that it will be hard for her but she is going to get  everything prepared so she is ready for infant's discharge. CSW inquired if MOB had any visitation barriers, MOB reported that she didn't like relying on other people. CSW acknowledged and validated MOB"s feelings about relying on other people and reminded MOB how important it is to utilize her supports while infant is admitted in the NICU. CSW explained to MOB that her supports will help her through this hard time, MOB verbalized understanding. CSW and MOB discussed medicaid transportation and how MOB could sign up, CSW provided MOB with contact information for medicaid transportation. CSW inquired if MOB needed any additional resources, MOB reported that she was still on the wait list for parenting classes through the [facility] and that someone is  scheduled to see her on 06-01-2018.   CSW and MOB briefly discussed previous assessment and how MOB was upset when speaking about her other children Ester Rink(Tacora Kopke 01-04-11;; Clarene Dukehasity Kunde 12-11-11) whom she lost custody of. MOB reported that she remembers, CSW reminded MOB that a CPS notification would be made now that she has gave birth. MOB was calm and verbalized understanding. During assessment MOB received a call from [facility](Halicia Janee Mornhompson [phone]) staff from [employer] [facility] and handed CSW the phone. Staff reported that they are working with MOB to get services started, noting that they provide mental health services (peer support, counseling and medication management). Staff requested that CSW call her after leaving MOB's room, MOB granted CSW verbal permission to call staff back after leaving her room.   CSW inquired about MOB's mental health history, MOB reported a history of panic attacks and denied any other mental health history. CSW asked MOB if she had postpartum depression after her first two births, MOB looked confused. CSW explained postpartum depression and MOB reported no. MOB presented calm and appeared excited about the birth of her daughter. MOB was engaged during assessment and did not become upset this time when speaking about CPS. MOB did not demonstrate any acute mental health signs/symptoms.   CSW provided education regarding the baby blues period vs. perinatal mood disorders, discussed treatment and gave resources for mental health follow up if concerns arise.  CSW recommends self-evaluation during the postpartum time period using the New Mom Checklist from Postpartum Progress and encouraged MOB to contact a medical professional if symptoms are noted at any time.    CSW contacted staff from [employer]([facility]), staff reported that MOB has been assessed and they are working with MOB to establish services. CSW asked if MOB had a mental health diagnosis, staff reported that  she is unaware and just wanted to notify CSW that MOB has came a [address] and is working hard to be able to parent her baby. Staff reported that MOB took it hard when she lost custody of her 2 older children and reported that a husband was involved at that time and he was not a good man. Staff reported that MOB's [place] now [place] and has no contact with MOB. CSW thanked staff for collateral information.   CSW made a notification to [facility] CPS due to MOB losing cutody of two older children.   CSW Plan/Description:  Child Protective Service Report , Perinatal Mood and Anxiety Disorder (PMADs) Education, Psychosocial Support and Ongoing Assessment of Needs    Antionette PolesKimberly L Klay Sobotka, LCSW 05/21/2018, 11:02 AM

## 2018-05-21 NOTE — Lactation Note (Addendum)
This note was copied from a baby's chart. Lactation Consultation Note: Follow up visit with this mom of 29 w 5 d infant in NICU. Mom reports she has pumped once and did not obtain any milk so she has not pumped since. Reports she can hand express a few drops of Colostrum. Reviewed importance of frequent pumping to promote milk supply for baby. Has called WIC and left a message for them about a pump for home. Possible DC today. [facility] but mom states she does not have the money for that. Reviewed how to use pump pieces as a manual pump so she has something at home to use. Reviewed engorgement prevention and treatment. No questions at present. To call prn  Patient Name: Girl Jule SerOctavia Baggett ZOXWR'UToday's Date: 05/21/2018 Reason for consult: Follow-up assessment;Preterm <34wks   Maternal Data Has patient been taught Hand Expression?: Yes Does the patient have breastfeeding experience prior to this delivery?: No  Feeding    LATCH Score                   Interventions    Lactation Tools Discussed/Used WIC Program: Yes   Consult Status Consult Status: Complete    Pamelia HoitWeeks, Tabias Swayze D 05/21/2018, 9:40 AM

## 2018-05-21 NOTE — Discharge Summary (Signed)
Postpartum Discharge Summary     Patient Name: Kathryn Shelton DOB: November 17, 1990 MRN: [id number]  Date of admission: 04/02/2018 Delivering Provider: Aura Camps   Date of discharge: 05/21/2018  Admitting diagnosis: 20wks, low fluid, premature weight, observation Intrauterine pregnancy: [redacted]w[redacted]d    Secondary diagnosis:  Principal Problem:   Preterm premature rupture of membranes, unspecified as to length of time between rupture and onset of labor, second trimester Active Problems:   Supervision of high risk pregnancy, antepartum   Chronic hypertension during pregnancy, antepartum   Pre-existing type 2 diabetes mellitus during pregnancy, antepartum   Oligohydramnios antepartum, second trimester, other fetus  Additional problems: none     Discharge diagnosis: Preterm Pregnancy Delivered and Type 2 DM                                                                                                Post partum procedures:none  Augmentation: no  Complications: None  Hospital course:  Onset of Labor With Vaginal Delivery     27y.o. yo [id number] 262w3das admitted in Latent Labor on 04/02/2018. Patient had an uncomplicated labor course as follows:  Membrane Rupture Time/Date: 12:00 AM ,03/09/2018   Intrapartum Procedures: Episiotomy: None [1]                                         Lacerations:  None [1]  Patient had a delivery of a Viable infant. 05/19/2018  Information for the patient's newborn:  VaKhanh, Cordner0[[id number]]Delivery Method: Vaginal, Spontaneous(Filed from Delivery Summary)    Pateint had an uncomplicated postpartum course.  She is ambulating, tolerating a regular diet, passing flatus, and urinating well. Patient is discharged home in stable condition on 05/21/18.   Magnesium Sulfate recieved: No BMZ received: Yes  Physical exam  Vitals:   05/20/18 1635 05/20/18 2050 05/21/18 0437 05/21/18 0756  BP: 98/73 108/68 131/83 (!) 125/91  Pulse: 89 98 73 77   Resp: 18 15 15 18   Temp: (!) 97.5 F (36.4 C) 97.8 F (36.6 C) (!) 97.3 F (36.3 C) 97.8 F (36.6 C)  TempSrc: Oral Oral Oral Oral  SpO2: (!) 83% 97% 100% 100%  Weight:      Height:       General: alert, cooperative and no distress Lochia: appropriate Uterine Fundus: firm Incision: N/A DVT Evaluation: No evidence of DVT seen on physical exam. Labs: Lab Results  Component Value Date   WBC 15.7 (H) 05/19/2018   HGB 12.0 05/19/2018   HCT 36.1 05/19/2018   MCV 83.8 05/19/2018   PLT 286 05/19/2018   CMP Latest Ref Rng & Units 05/18/2018  Glucose 70 - 99 mg/dL 149(H)  BUN 6 - 20 mg/dL 6  Creatinine 0.44 - 1.00 mg/dL 0.64  Sodium 135 - 145 mmol/L 131(L)  Potassium 3.5 - 5.1 mmol/L 3.4(L)  Chloride 98 - 111 mmol/L 103  CO2 22 - 32 mmol/L 21(L)  Calcium 8.9 - 10.3 mg/dL 8.2(L)  Total Protein 6.5 -  8.1 g/dL 6.0(L)  Total Bilirubin 0.3 - 1.2 mg/dL 0.7  Alkaline Phos 38 - 126 U/L 74  AST 15 - 41 U/L 17  ALT 0 - 44 U/L 10    Discharge instruction: per After Visit Summary and "Baby and Me Booklet".  After visit meds:  Allergies as of 05/21/2018      Reactions   Shellfish-derived Products Swelling      Medication List    STOP taking these medications   aspirin EC 81 MG tablet   Insulin Glargine 100 UNIT/ML Solostar Pen Commonly known as:  LANTUS   ranitidine 150 MG tablet Commonly known as:  ZANTAC     TAKE these medications   ACCU-CHEK GUIDE w/Device Kit 1 Device by Does not apply route 4 (four) times daily.   glucose blood test strip Commonly known as:  ACCU-CHEK GUIDE Use to check blood sugars four times a day was instructed   ibuprofen 600 MG tablet Commonly known as:  ADVIL,MOTRIN Take 1 tablet (600 mg total) by mouth every 6 (six) hours as needed.   metFORMIN 1000 MG tablet Commonly known as:  GLUCOPHAGE Take 1 tablet (1,000 mg total) by mouth 2 (two) times daily with a meal.   Prenatal Adult Gummy/DHA/FA 0.4-25 MG Chew Chew 1 tablet by mouth  daily.       Diet: carb modified diet  Activity: Advance as tolerated. Pelvic rest for 6 weeks.   Outpatient follow up:4 weeks Follow up Appt: Future Appointments  Date Time Provider [place]  05/27/2018 11:30 AM [place] None  06/14/2018  2:45 PM Constant, Vickii Chafe, MD [place] None   Follow up Visit: [place] Follow up in 4 week(s).   Specialty:  Obstetrics and Gynecology Contact information: [address] [place] [phone]           Please schedule this patient for Postpartum visit in: 4 weeks with the following provider: Any provider For C/S patients schedule nurse incision check in weeks 2 weeks: no High risk pregnancy complicated by: GDM, premature rupture of membranes Delivery mode:  SVD Anticipated Birth Control:  other/unsure PP Procedures needed: f/u with PCP  Schedule Integrated BH visit: no      Newborn Data: Live born female  Birth Weight:   APGAR: 33, 8  Newborn Delivery   Birth date/time:  05/19/2018 21:39:00 Delivery type:  Vaginal, Spontaneous     Baby Feeding: Breast Disposition:NICU   05/21/2018 Emeterio Reeve, MD

## 2018-05-21 NOTE — Discharge Instructions (Signed)
Vaginal Delivery, Care After °Refer to this sheet in the next few weeks. These instructions provide you with information about caring for yourself after vaginal delivery. Your health care provider may also give you more specific instructions. Your treatment has been planned according to current medical practices, but problems sometimes occur. Call your health care provider if you have any problems or questions. °What can I expect after the procedure? °After vaginal delivery, it is common to have: °· Some bleeding from your vagina. °· Soreness in your abdomen, your vagina, and the area of skin between your vaginal opening and your anus (perineum). °· Pelvic cramps. °· Fatigue. °Follow these instructions at home: °Medicines °· Take over-the-counter and prescription medicines only as told by your health care provider. °· If you were prescribed an antibiotic medicine, take it as told by your health care provider. Do not stop taking the antibiotic until it is finished. °Driving ° °· Do not drive or operate heavy machinery while taking prescription pain medicine. °· Do not drive for 24 hours if you received a sedative. °Lifestyle °· Do not drink alcohol. This is especially important if you are breastfeeding or taking medicine to relieve pain. °· Do not use tobacco products, including cigarettes, chewing tobacco, or e-cigarettes. If you need help quitting, ask your health care provider. °Eating and drinking °· Drink at least 8 eight-ounce glasses of water every day unless you are told not to by your health care provider. If you choose to breastfeed your baby, you may need to drink more water than this. °· Eat high-fiber foods every day. These foods may help prevent or relieve constipation. High-fiber foods include: °? Whole grain cereals and breads. °? Brown rice. °? Beans. °? Fresh fruits and vegetables. °Activity °· Return to your normal activities as told by your health care provider. Ask your health care provider what  activities are safe for you. °· Rest as much as possible. Try to rest or take a nap when your baby is sleeping. °· Do not lift anything that is heavier than your baby or 10 lb (4.5 kg) until your health care provider says that it is safe. °· Talk with your health care provider about when you can engage in sexual activity. This may depend on your: °? Risk of infection. °? Rate of healing. °? Comfort and desire to engage in sexual activity. °Vaginal Care °· If you have an episiotomy or a vaginal tear, check the area every day for signs of infection. Check for: °? More redness, swelling, or pain. °? More fluid or blood. °? Warmth. °? Pus or a bad smell. °· Do not use tampons or douches until your health care provider says this is safe. °· Watch for any blood clots that may pass from your vagina. These may look like clumps of dark red, brown, or black discharge. °General instructions °· Keep your perineum clean and dry as told by your health care provider. °· Wear loose, comfortable clothing. °· Wipe from front to back when you use the toilet. °· Ask your health care provider if you can shower or take a bath. If you had an episiotomy or a perineal tear during labor and delivery, your health care provider may tell you not to take baths for a certain length of time. °· Wear a bra that supports your breasts and fits you well. °· If possible, have someone help you with household activities and help care for your baby for at least a few days after you   leave the hospital. °· Keep all follow-up visits for you and your baby as told by your health care provider. This is important. °Contact a health care provider if: °· You have: °? Vaginal discharge that has a bad smell. °? Difficulty urinating. °? Pain when urinating. °? A sudden increase or decrease in the frequency of your bowel movements. °? More redness, swelling, or pain around your episiotomy or vaginal tear. °? More fluid or blood coming from your episiotomy or vaginal  tear. °? Pus or a bad smell coming from your episiotomy or vaginal tear. °? A fever. °? A rash. °? Little or no interest in activities you used to enjoy. °? Questions about caring for yourself or your baby. °· Your episiotomy or vaginal tear feels warm to the touch. °· Your episiotomy or vaginal tear is separating or does not appear to be healing. °· Your breasts are painful, hard, or turn red. °· You feel unusually sad or worried. °· You feel nauseous or you vomit. °· You pass large blood clots from your vagina. If you pass a blood clot from your vagina, save it to show to your health care provider. Do not flush blood clots down the toilet without having your health care provider look at them. °· You urinate more than usual. °· You are dizzy or light-headed. °· You have not breastfed at all and you have not had a menstrual period for 12 weeks after delivery. °· You have stopped breastfeeding and you have not had a menstrual period for 12 weeks after you stopped breastfeeding. °Get help right away if: °· You have: °? Pain that does not go away or does not get better with medicine. °? Chest pain. °? Difficulty breathing. °? Blurred vision or spots in your vision. °? Thoughts about hurting yourself or your baby. °· You develop pain in your abdomen or in one of your legs. °· You develop a severe headache. °· You faint. °· You bleed from your vagina so much that you fill two sanitary pads in one hour. °This information is not intended to replace advice given to you by your health care provider. Make sure you discuss any questions you have with your health care provider. °Document Released: 05/09/2000 Document Revised: 10/24/2015 Document Reviewed: 05/27/2015 °Elsevier Interactive Patient Education © 2019 [employer] ° °

## 2018-05-21 NOTE — Progress Notes (Signed)
Spoke with Dr. Debroah LoopArnold by phone to discuss discharge planning for patient. Patient was on Metformin prepregnancy and if able to limit intake of sugary drinks and carbohydrates, may do well going home on Metformin with followup as soon as possible with her PCP. Attempted to call patient without success and will attempt again.  Thank you, Billy FischerJudy E. Liane Tribbey, RN, MSN, CDE  Diabetes Coordinator Inpatient Glycemic Control Team Team Pager [phone] (8am-5pm) 05/21/2018 10:12 AM

## 2018-05-27 ENCOUNTER — Ambulatory Visit: Payer: Medicaid Other

## 2018-05-27 VITALS — BP 111/78 | HR 81 | Wt 225.8 lb

## 2018-05-27 DIAGNOSIS — Z0130 Encounter for examination of blood pressure without abnormal findings: Secondary | ICD-10-CM

## 2018-05-27 NOTE — Progress Notes (Signed)
Patient is in the office for BP check, BP 111/78, notified provider. Patient states she is currently not taking any meds. PP appt scheduled for 06-14-18.

## 2018-06-14 ENCOUNTER — Encounter: Payer: Self-pay | Admitting: Obstetrics and Gynecology

## 2018-06-14 ENCOUNTER — Ambulatory Visit (INDEPENDENT_AMBULATORY_CARE_PROVIDER_SITE_OTHER): Payer: Medicaid Other | Admitting: Obstetrics and Gynecology

## 2018-06-14 MED ORDER — LEVONORGEST-ETH ESTRADIOL-IRON 0.1-20 MG-MCG(21) PO TABS: 1.0000 | ORAL_TABLET | Freq: Every day | ORAL | 4 refills | Status: DC

## 2018-06-14 NOTE — Progress Notes (Signed)
Post Partum Exam  Kathryn Shelton is a 28 y.o. [phone] female who presents for a postpartum visit. She is 4 weeks postpartum following a spontaneous vaginal delivery. I have fully reviewed the prenatal and intrapartum course. The delivery was at  29 gestational weeks.  Anesthesia: epidural. Postpartum course has been unremarkable. Baby's course has been- baby is still in the NICU currently (PPROM at 17 weeks with delivery at 29 weeks). Baby is feeding by formula. Bleeding no bleeding. Bowel function is normal. Bladder function is normal. Patient is not sexually active. Contraception method is OCP (estrogen/progesterone). Postpartum depression screening:neg (score 2)    Last pap smear done 8/819 and was Normal  Review of Systems Pertinent items are noted in HPI.    Objective:  Blood pressure 124/84, pulse 98, weight 228 lb 11.2 oz (103.7 kg), not currently breastfeeding.  General:  alert, cooperative and no distress   Breasts:  inspection negative, no nipple discharge or bleeding, no masses or nodularity palpable  Lungs: clear to auscultation bilaterally  Heart:  regular rate and rhythm  Abdomen: soft, non-tender; bowel sounds normal; no masses,  no organomegaly   Vulva:  normal  Vagina: normal vagina, no discharge, exudate, lesion, or erythema  Cervix:  multiparous appearance  Corpus: normal size, contour, position, consistency, mobility, non-tender  Adnexa:  normal adnexa and no mass, fullness, tenderness  Rectal Exam: Not performed.        Assessment:    Normal postpartum exam. Pap smear not done at today's visit.   Plan:   1. Contraception: OCP (estrogen/progesterone) 2. Patient is medically cleared to resume all activities of daily lviing 3. Follow up in: 7 months for annual exam or as needed.

## 2018-07-30 ENCOUNTER — Encounter (HOSPITAL_COMMUNITY): Payer: Self-pay | Admitting: Emergency Medicine

## 2018-07-30 ENCOUNTER — Emergency Department (HOSPITAL_COMMUNITY)
Admission: EM | Admit: 2018-07-30 | Discharge: 2018-07-30 | Disposition: A | Discharge status: Home / Self Care | Payer: Medicaid Other | Attending: Emergency Medicine | Admitting: Emergency Medicine

## 2018-07-30 ENCOUNTER — Other Ambulatory Visit: Payer: Self-pay

## 2018-07-30 ENCOUNTER — Emergency Department (HOSPITAL_COMMUNITY): Payer: Medicaid Other

## 2018-07-30 DIAGNOSIS — I10 Essential (primary) hypertension: Secondary | ICD-10-CM | POA: Insufficient documentation

## 2018-07-30 DIAGNOSIS — R002 Palpitations: Secondary | ICD-10-CM | POA: Diagnosis not present

## 2018-07-30 DIAGNOSIS — Z7984 Long term (current) use of oral hypoglycemic drugs: Secondary | ICD-10-CM | POA: Diagnosis not present

## 2018-07-30 DIAGNOSIS — R0602 Shortness of breath: Secondary | ICD-10-CM | POA: Diagnosis present

## 2018-07-30 DIAGNOSIS — Z79899 Other long term (current) drug therapy: Secondary | ICD-10-CM | POA: Insufficient documentation

## 2018-07-30 DIAGNOSIS — F410 Panic disorder [episodic paroxysmal anxiety] without agoraphobia: Secondary | ICD-10-CM | POA: Insufficient documentation

## 2018-07-30 DIAGNOSIS — E119 Type 2 diabetes mellitus without complications: Secondary | ICD-10-CM | POA: Diagnosis not present

## 2018-07-30 LAB — CBC
HCT: 38.8 % (ref 36.0–46.0)
Hemoglobin: 12.2 g/dL (ref 12.0–15.0)
MCH: 25.6 pg — ABNORMAL LOW (ref 26.0–34.0)
MCHC: 31.4 g/dL (ref 30.0–36.0)
MCV: 81.3 fL (ref 80.0–100.0)
Platelets: 347 10*3/uL (ref 150–400)
RBC: 4.77 MIL/uL (ref 3.87–5.11)
RDW: 14.1 % (ref 11.5–15.5)
WBC: 4.6 10*3/uL (ref 4.0–10.5)
nRBC: 0 % (ref 0.0–0.2)

## 2018-07-30 LAB — I-STAT TROPONIN, ED: Troponin i, poc: 0.01 ng/mL (ref 0.00–0.08)

## 2018-07-30 LAB — BASIC METABOLIC PANEL
Anion gap: 8 (ref 5–15)
BUN: 13 mg/dL (ref 6–20)
CO2: 24 mmol/L (ref 22–32)
Calcium: 9 mg/dL (ref 8.9–10.3)
Chloride: 105 mmol/L (ref 98–111)
Creatinine, Ser: 0.95 mg/dL (ref 0.44–1.00)
GFR calc Af Amer: >60 mL/min (ref >60)
Glucose, Bld: 148 mg/dL — ABNORMAL HIGH (ref 70–99)
POTASSIUM: 3.9 mmol/L (ref 3.5–5.1)
Sodium: 137 mmol/L (ref 135–145)

## 2018-07-30 LAB — I-STAT BETA HCG BLOOD, ED (MC, WL, AP ONLY): I-stat hCG, quantitative: <5 m[IU]/mL (ref <5)

## 2018-07-30 LAB — CBG MONITORING, ED: Glucose-Capillary: 140 mg/dL — ABNORMAL HIGH (ref 70–99)

## 2018-07-30 MED ORDER — SODIUM CHLORIDE 0.9% FLUSH: 3.0000 mL | Freq: Once | INTRAVENOUS | Status: DC

## 2018-07-30 NOTE — ED Provider Notes (Signed)
[facility] EMERGENCY DEPARTMENT Provider Note   CSN: [id number] Arrival date & time: 07/30/18  0156    History   Chief Complaint Chief Complaint  Patient presents with  . Chest Pain  . Shortness of Breath    HPI Devika Dragovich is a 28 y.o. female with history of hypertension, diabetes who is 2 months postpartum who presents with recurrent episodes of shortness of breath and palpitations waking her up from sleep for the past several nights.  She reports the episodes start with flushing and feeling hot.  Patient has been under a lot of stress as her child is in the NICU.  She reports she is back to her normal self and has no symptoms now.  She denies any chest pain during these episodes.  She reports her symptoms began to resolve when she tries to calm down.  She denies any new leg pain or swelling, recent long trips, surgeries, known cancer, history of blood clots.     HPI  Past Medical History:  Diagnosis Date  . DM (diabetes mellitus), type 2 ([place])   . Essential hypertension 03/01/2017  . GERD (gastroesophageal reflux disease)   . Hypertension   . Nausea and vomiting during pregnancy prior to [redacted] weeks gestation 01/14/2018  . Sleep apnea     Patient Active Problem List   Diagnosis Date Noted  . Oligohydramnios antepartum, second trimester, other fetus 03/09/2018  . Obesity, morbid ([place]) 03/09/2018  . Preterm premature rupture of membranes, unspecified as to length of time between rupture and onset of labor, second trimester 03/09/2018  . Supervision of high risk pregnancy, antepartum 12/31/2017  . Chronic hypertension during pregnancy, antepartum 12/31/2017  . Pre-existing type 2 diabetes mellitus during pregnancy, antepartum 12/31/2017  . Mixed hyperlipidemia 12/02/2017  . Type II diabetes mellitus, uncontrolled ([place]) 03/01/2017  . Essential hypertension 03/01/2017  . Intermittent palpitations 03/01/2017  . Panic attack 03/01/2017  . Obstructive sleep  apnea syndrome 03/01/2017    History reviewed. No pertinent surgical history.   OB History    Gravida  3   Para  3   Term  2   Preterm  1   AB  0   Living  3     SAB  0   TAB  0   Ectopic  0   Multiple  0   Live Births  3            Home Medications    Prior to Admission medications   Medication Sig Start Date End Date Taking? Authorizing Provider  Blood Glucose Monitoring Suppl (ACCU-CHEK GUIDE) w/Device KIT 1 Device by Does not apply route 4 (four) times daily. Patient not taking: Reported on 05/27/2018 03/18/18   Verita Schneiders A, MD  glucose blood (ACCU-CHEK GUIDE) test strip Use to check blood sugars four times a day was instructed Patient not taking: Reported on 05/27/2018 03/18/18   Anyanwu, Sallyanne Havers, MD  ibuprofen (ADVIL,MOTRIN) 600 MG tablet Take 1 tablet (600 mg total) by mouth every 6 (six) hours as needed. Patient not taking: Reported on 05/27/2018 05/21/18   Woodroe Mode, MD  Levonorgest-Eth Estrad-Fe Bisg (BALCOLTRA) 0.1-20 MG-MCG(21) TABS Take 1 tablet by mouth daily. 06/14/18   Constant, Peggy, MD  metFORMIN (GLUCOPHAGE) 1000 MG tablet Take 1 tablet (1,000 mg total) by mouth 2 (two) times daily with a meal. Patient not taking: Reported on 05/27/2018 05/21/18   Woodroe Mode, MD  Prenatal MV & Min w/FA-DHA (PRENATAL ADULT GUMMY/DHA/FA)  0.4-25 MG CHEW Chew 1 tablet by mouth daily. Patient not taking: Reported on 05/27/2018 12/10/17   Laury Deep, CNM    Family History Family History  Adopted: Yes  Family history unknown: Yes    Social History Social History   Tobacco Use  . Smoking status: Never Smoker  . Smokeless tobacco: Never Used  Substance Use Topics  . Alcohol use: No  . Drug use: No     Allergies   Shellfish-derived products   Review of Systems Review of Systems  Constitutional: Negative for chills and fever.  HENT: Negative for facial swelling and sore throat.   Respiratory: Positive for shortness of breath.     Cardiovascular: Positive for palpitations. Negative for chest pain and leg swelling.  Gastrointestinal: Negative for abdominal pain, nausea and vomiting.  Genitourinary: Negative for dysuria.  Musculoskeletal: Negative for back pain.  Skin: Negative for rash and wound.  Neurological: Negative for headaches.  Psychiatric/Behavioral: The patient is not nervous/anxious.      Physical Exam Updated Vital Signs BP (!) 128/91   Pulse 76   Temp 98.2 F (36.8 C) (Oral)   Resp 15   SpO2 100%   Physical Exam Vitals signs and nursing note reviewed.  Constitutional:      General: She is not in acute distress.    Appearance: She is well-developed. She is not diaphoretic.  HENT:     Head: Normocephalic and atraumatic.     Mouth/Throat:     Pharynx: No oropharyngeal exudate.  Eyes:     General: No scleral icterus.       Right eye: No discharge.        Left eye: No discharge.     Conjunctiva/sclera: Conjunctivae normal.     Pupils: Pupils are equal, round, and reactive to light.  Neck:     Musculoskeletal: Normal range of motion and neck supple.     Thyroid: No thyromegaly.  Cardiovascular:     Rate and Rhythm: Normal rate and regular rhythm.     Heart sounds: Normal heart sounds. No murmur. No friction rub. No gallop.   Pulmonary:     Effort: Pulmonary effort is normal. No respiratory distress.     Breath sounds: Normal breath sounds. No stridor. No wheezing or rales.  Abdominal:     General: Bowel sounds are normal. There is no distension.     Palpations: Abdomen is soft.     Tenderness: There is no abdominal tenderness. There is no guarding or rebound.  Lymphadenopathy:     Cervical: No cervical adenopathy.  Skin:    General: Skin is warm and dry.     Coloration: Skin is not pale.     Findings: No rash.  Neurological:     Mental Status: She is alert.     Coordination: Coordination normal.      ED Treatments / Results  Labs (all labs ordered are listed, but only  abnormal results are displayed) Labs Reviewed  BASIC METABOLIC PANEL - Abnormal; Notable for the following components:      Result Value   Glucose, Bld 148 (*)    All other components within normal limits  CBC - Abnormal; Notable for the following components:   MCH 25.6 (*)    All other components within normal limits  CBG MONITORING, ED - Abnormal; Notable for the following components:   Glucose-Capillary 140 (*)    All other components within normal limits  I-STAT TROPONIN, ED  I-STAT BETA HCG BLOOD,  ED ([facility], [facility], [facility] ONLY)    EKG None  Radiology Dg Chest 2 View  Result Date: 07/30/2018 CLINICAL DATA:  Sharp central chest pain. EXAM: CHEST - 2 VIEW COMPARISON:  06/14/2017 FINDINGS: The heart size and mediastinal contours are within normal limits. Both lungs are clear. The visualized skeletal structures are unremarkable. IMPRESSION: No active cardiopulmonary disease. Electronically Signed   By: Ashley Royalty M.D.   On: 07/30/2018 02:39    Procedures Procedures (including critical care time)  Medications Ordered in ED Medications  sodium chloride flush (NS) 0.9 % injection 3 mL (has no administration in time range)     Initial Impression / Assessment and Plan / ED Course  I have reviewed the triage vital signs and the nursing notes.  Pertinent labs & imaging results that were available during my care of the patient were reviewed by me and considered in my medical decision making (see chart for details).        Patient with probable panic attacks as she is under a lot of stress as her newborn has been in the NICU.  Patient symptoms resolved on their own.  Labs are unremarkable including troponin, chest x-ray, EKG.  Patient is PERC negative.  Patient is back to baseline and would like to go visit her child now.  Patient does not currently take anything for blood pressure states she needs to find a PCP.  Patient will be given resources.  Return precautions discussed.  Patient  understands and agrees with plan.  Patient vital stable throughout ED course and discharged in satisfactory condition.  Final Clinical Impressions(s) / ED Diagnoses   Final diagnoses:  Anxiety attack    ED Discharge Orders    None       Frederica Kuster, PA-C 07/30/18 0418    Veryl Speak, MD 07/30/18 [phone]

## 2018-07-30 NOTE — Discharge Instructions (Addendum)
Please follow-up and establish care with primary care provider by calling the number below or the number circled on your discharge paperwork.  Please return the emergency department you develop any new or worsening symptoms including episodes of shortness of breath and palpitations that are continuing, chest pain, or any other concerning symptoms.

## 2018-07-30 NOTE — ED Triage Notes (Signed)
C/o sharp pain to center of chest, heart racing, and SOB that started around 11pm while sleeping.

## 2018-08-26 ENCOUNTER — Emergency Department (HOSPITAL_COMMUNITY)
Admission: EM | Admit: 2018-08-26 | Discharge: 2018-08-26 | Disposition: A | Discharge status: Home / Self Care | Payer: Medicaid Other | Attending: Emergency Medicine | Admitting: Emergency Medicine

## 2018-08-26 ENCOUNTER — Other Ambulatory Visit: Payer: Self-pay

## 2018-08-26 ENCOUNTER — Encounter (HOSPITAL_COMMUNITY): Payer: Self-pay | Admitting: Emergency Medicine

## 2018-08-26 DIAGNOSIS — E119 Type 2 diabetes mellitus without complications: Secondary | ICD-10-CM | POA: Insufficient documentation

## 2018-08-26 DIAGNOSIS — Z79899 Other long term (current) drug therapy: Secondary | ICD-10-CM | POA: Insufficient documentation

## 2018-08-26 DIAGNOSIS — R509 Fever, unspecified: Secondary | ICD-10-CM | POA: Diagnosis present

## 2018-08-26 DIAGNOSIS — F410 Panic disorder [episodic paroxysmal anxiety] without agoraphobia: Secondary | ICD-10-CM | POA: Diagnosis not present

## 2018-08-26 DIAGNOSIS — I10 Essential (primary) hypertension: Secondary | ICD-10-CM | POA: Insufficient documentation

## 2018-08-26 NOTE — Discharge Instructions (Addendum)
Your EKG is unchanged from your prior visit.  Your symptoms are consistent with a panic attack.  Continue follow-up with your primary care doctor for management of symptoms.  Get plenty of sleep at nighttime as lack of sleep may worsen these episodes.  Eat regular meals and remain hydrated by drinking plenty of fluids.  You may return to the ED for new or concerning symptoms.

## 2018-08-26 NOTE — ED Provider Notes (Signed)
[facility] EMERGENCY DEPARTMENT Provider Note   CSN: [id number] Arrival date & time: 08/26/18  0056    History   Chief Complaint Chief Complaint  Patient presents with  . Fever    HPI Kathryn Shelton is a 28 y.o. female.    28 year old female with a history of diabetes mellitus, essential hypertension, esophageal reflux, sleep apnea presents to the emergency department for complaints of "feeling hot" with palpitations.  Palpitations characterized as a rapid heartbeat.  She notes mild associated shortness of breath.  Symptoms began tonight, waking her from sleep.  She has been experiencing similar episodes on occasion, mostly at nighttime, for the past month.  Was seen in the ED 1 month ago for these complaints as well.  Is approximately 3 months postpartum.  Had an extended stay in the NICU with her newborn.  Recently brought her child back to the hospital after they were experiencing difficulty breathing.  States that she has not slept regularly since the delivery.  Her PCP reportedly started her on a PRN medication for anxiety, but she did not take it this evening; last used it a few days ago.  She does endorse use of an Advil PM tablet before falling asleep tonight.  The history is provided by the patient. No language interpreter was used.    Past Medical History:  Diagnosis Date  . DM (diabetes mellitus), type 2 ([place])   . Essential hypertension 03/01/2017  . GERD (gastroesophageal reflux disease)   . Hypertension   . Nausea and vomiting during pregnancy prior to [redacted] weeks gestation 01/14/2018  . Sleep apnea     Patient Active Problem List   Diagnosis Date Noted  . Oligohydramnios antepartum, second trimester, other fetus 03/09/2018  . Obesity, morbid ([place]) 03/09/2018  . Preterm premature rupture of membranes, unspecified as to length of time between rupture and onset of labor, second trimester 03/09/2018  . Supervision of high risk pregnancy, antepartum  12/31/2017  . Chronic hypertension during pregnancy, antepartum 12/31/2017  . Pre-existing type 2 diabetes mellitus during pregnancy, antepartum 12/31/2017  . Mixed hyperlipidemia 12/02/2017  . Type II diabetes mellitus, uncontrolled ([place]) 03/01/2017  . Essential hypertension 03/01/2017  . Intermittent palpitations 03/01/2017  . Panic attack 03/01/2017  . Obstructive sleep apnea syndrome 03/01/2017    History reviewed. No pertinent surgical history.   OB History    Gravida  3   Para  3   Term  2   Preterm  1   AB  0   Living  3     SAB  0   TAB  0   Ectopic  0   Multiple  0   Live Births  3            Home Medications    Prior to Admission medications   Medication Sig Start Date End Date Taking? Authorizing Provider  Blood Glucose Monitoring Suppl (ACCU-CHEK GUIDE) w/Device KIT 1 Device by Does not apply route 4 (four) times daily. Patient not taking: Reported on 05/27/2018 03/18/18   Verita Schneiders A, MD  glucose blood (ACCU-CHEK GUIDE) test strip Use to check blood sugars four times a day was instructed Patient not taking: Reported on 05/27/2018 03/18/18   Anyanwu, Sallyanne Havers, MD  ibuprofen (ADVIL,MOTRIN) 600 MG tablet Take 1 tablet (600 mg total) by mouth every 6 (six) hours as needed. Patient not taking: Reported on 05/27/2018 05/21/18   Woodroe Mode, MD  Levonorgest-Eth Estrad-Fe Bisg (BALCOLTRA) 0.1-20 MG-MCG(21) TABS  Take 1 tablet by mouth daily. 06/14/18   Constant, Peggy, MD  metFORMIN (GLUCOPHAGE) 1000 MG tablet Take 1 tablet (1,000 mg total) by mouth 2 (two) times daily with a meal. Patient not taking: Reported on 05/27/2018 05/21/18   Woodroe Mode, MD  Prenatal MV & Min w/FA-DHA (PRENATAL ADULT GUMMY/DHA/FA) 0.4-25 MG CHEW Chew 1 tablet by mouth daily. Patient not taking: Reported on 05/27/2018 12/10/17   Laury Deep, CNM    Family History Family History  Adopted: Yes  Family history unknown: Yes    Social History Social History   Tobacco  Use  . Smoking status: Never Smoker  . Smokeless tobacco: Never Used  Substance Use Topics  . Alcohol use: No  . Drug use: No     Allergies   Shellfish-derived products   Review of Systems Review of Systems  Ten systems reviewed and are negative for acute change, except as noted in the HPI.    Physical Exam Updated Vital Signs BP (!) 153/114   Pulse 73   Temp 97.7 F (36.5 C) (Oral)   Resp 13   Ht _0  (1.676 m)   Wt 104.3 kg   SpO2 95%   BMI 37.12 kg/m   Physical Exam Vitals signs and nursing note reviewed.  Constitutional:      General: She is not in acute distress.    Appearance: She is well-developed. She is not diaphoretic.     Comments: Obese female. Appears drowsy. Will close eyes and appear to drift into a light sleep when not stimulated.  HENT:     Head: Normocephalic and atraumatic.  Eyes:     General: No scleral icterus.    Conjunctiva/sclera: Conjunctivae normal.  Neck:     Musculoskeletal: Normal range of motion.  Cardiovascular:     Rate and Rhythm: Normal rate and regular rhythm.     Pulses: Normal pulses.  Pulmonary:     Effort: Pulmonary effort is normal. No respiratory distress.     Breath sounds: No stridor. No wheezing, rhonchi or rales.     Comments: Lungs CTAB. Respirations even and unlabored. Musculoskeletal: Normal range of motion.  Skin:    General: Skin is warm and dry.     Coloration: Skin is not pale.     Findings: No erythema or rash.  Neurological:     General: No focal deficit present.     Mental Status: She is alert and oriented to person, place, and time.     Coordination: Coordination normal.     Comments: GCS 15. Moving all extremities spontaneously.  Psychiatric:        Behavior: Behavior normal.      ED Treatments / Results  Labs (all labs ordered are listed, but only abnormal results are displayed) Labs Reviewed - No data to display  EKG EKG Interpretation  Date/Time:  Thursday August 26 2018 02:40:59 EDT  Ventricular Rate:  79 PR Interval:    QRS Duration: 82 QT Interval:  400 QTC Calculation: 459 R Axis:   65 Text Interpretation:  Sinus rhythm Low voltage, precordial leads Otherwise within normal limits When compared with ECG of 07/30/2018, No significant change was found Confirmed by Delora Fuel ([id number]) on 08/26/2018 2:46:12 AM   Radiology No results found.  Procedures Procedures (including critical care time)  Medications Ordered in ED Medications - No data to display   Initial Impression / Assessment and Plan / ED Course  I have reviewed the triage vital signs and the  nursing notes.  Pertinent labs & imaging results that were available during my care of the patient were reviewed by me and considered in my medical decision making (see chart for details).        Patient presenting to the emergency department for palpitations which woke her from sleep with a feeling of being "hot".  Had some mild associated shortness of breath.  Was seen for similar complaints 1 month ago.  Her EKG is unchanged.  Patient not noted to be tachycardic in the emergency department.  Her symptoms are consistent with an anxiety attack, likely brought on by increased stress.  Has history of delivery of premature infant in November 2019 with prolonged NICU stay.  The patient's child was readmitted to the hospital recently for difficulty breathing.  She states that she has not been sleeping well since her child's delivery.  I have encouraged her to continue follow-up with her primary care doctor.  Return precautions discussed and provided. Patient discharged in stable condition with no unaddressed concerns.   Final Clinical Impressions(s) / ED Diagnoses   Final diagnoses:  Panic attack    ED Discharge Orders    None       Antonietta Breach, PA-C 07/35/43 0148    Delora Fuel, MD 40/39/79 [phone]

## 2018-08-26 NOTE — ED Triage Notes (Signed)
  Patient comes in with "hot feeling" that she started tonight while asleep.  Patient states she did not know if she has a fever and was afebrile on arrival.  Patient states it woke her up and her heart was beating fast.  HR 85 on arrival.  Patient has no other complaints besides her body feeling hot.  Patient A&O x4.  Has not been around anyone that has been sick that she is aware of.

## 2018-09-14 ENCOUNTER — Ambulatory Visit: Payer: Self-pay | Admitting: *Deleted

## 2018-09-16 ENCOUNTER — Other Ambulatory Visit: Payer: Self-pay

## 2018-09-16 ENCOUNTER — Emergency Department (HOSPITAL_COMMUNITY)
Admission: EM | Admit: 2018-09-16 | Discharge: 2018-09-16 | Disposition: A | Discharge status: Home / Self Care | Payer: Medicaid Other | Attending: Emergency Medicine | Admitting: Emergency Medicine

## 2018-09-16 ENCOUNTER — Encounter (HOSPITAL_COMMUNITY): Payer: Self-pay

## 2018-09-16 DIAGNOSIS — Z79899 Other long term (current) drug therapy: Secondary | ICD-10-CM | POA: Diagnosis not present

## 2018-09-16 DIAGNOSIS — Z20828 Contact with and (suspected) exposure to other viral communicable diseases: Secondary | ICD-10-CM | POA: Insufficient documentation

## 2018-09-16 DIAGNOSIS — R61 Generalized hyperhidrosis: Secondary | ICD-10-CM | POA: Diagnosis present

## 2018-09-16 DIAGNOSIS — B349 Viral infection, unspecified: Secondary | ICD-10-CM | POA: Diagnosis not present

## 2018-09-16 DIAGNOSIS — I10 Essential (primary) hypertension: Secondary | ICD-10-CM | POA: Diagnosis not present

## 2018-09-16 DIAGNOSIS — E119 Type 2 diabetes mellitus without complications: Secondary | ICD-10-CM | POA: Insufficient documentation

## 2018-09-16 DIAGNOSIS — R070 Pain in throat: Secondary | ICD-10-CM | POA: Diagnosis not present

## 2018-09-16 LAB — SARS CORONAVIRUS 2 BY RT PCR (HOSPITAL ORDER, PERFORMED IN ~~LOC~~ HOSPITAL LAB): SARS Coronavirus 2: NEGATIVE

## 2018-09-16 NOTE — ED Notes (Signed)
Pt reminded of what [facility] staff told her about visiting her child. Pt advised she understood same and would comply. Pt advised she would call to give them the information for another family member to sit with her baby.

## 2018-09-16 NOTE — ED Notes (Signed)
Staff from [facility], charge and doctor, came down to speak to the pt and bring her belongings at the pt's request. Pt had a lot of questions for the floor that the ED staff were not able to answer.

## 2018-09-16 NOTE — ED Notes (Signed)
Pt requesting to speak to her baby's nurse on [facility]. Contacted the floor at the request of the pt to have staff come down and speak to her as well as bring her belongings to her.

## 2018-09-16 NOTE — ED Provider Notes (Signed)
[facility] EMERGENCY DEPARTMENT Provider Note   CSN: [id number] Arrival date & time: 09/16/18  0031    History   Chief Complaint Chief Complaint  Patient presents with   Excessive Sweating    complains of warmth under her chest    HPI Kathryn Shelton is a 28 y.o. female.     The history is provided by the patient.  Sore Throat  This is a new problem. The current episode started yesterday. The problem occurs constantly. The problem has not changed since onset.Pertinent negatives include no chest pain, no abdominal pain and no shortness of breath. Nothing aggravates the symptoms. Nothing relieves the symptoms. Treatments tried: spray. The treatment provided no relief.  Patient complains of sweating, Feeling warm all over and sore throat. No diarrhea.  No documented fever. No change in voice.  No anosmia.  But her child a former 67 week preemie who is admitted upstairs for rule out COVID.     Past Medical History:  Diagnosis Date   DM (diabetes mellitus), type 2 ([place])    Essential hypertension 03/01/2017   GERD (gastroesophageal reflux disease)    Hypertension    Nausea and vomiting during pregnancy prior to [redacted] weeks gestation 01/14/2018   Sleep apnea     Patient Active Problem List   Diagnosis Date Noted   Oligohydramnios antepartum, second trimester, other fetus 03/09/2018   Obesity, morbid ([place]) 03/09/2018   Preterm premature rupture of membranes, unspecified as to length of time between rupture and onset of labor, second trimester 03/09/2018   Supervision of high risk pregnancy, antepartum 12/31/2017   Chronic hypertension during pregnancy, antepartum 12/31/2017   Pre-existing type 2 diabetes mellitus during pregnancy, antepartum 12/31/2017   Mixed hyperlipidemia 12/02/2017   Type II diabetes mellitus, uncontrolled ([place]) 03/01/2017   Essential hypertension 03/01/2017   Intermittent palpitations 03/01/2017   Panic attack 03/01/2017    Obstructive sleep apnea syndrome 03/01/2017    History reviewed. No pertinent surgical history.   OB History    Gravida  3   Para  3   Term  2   Preterm  1   AB  0   Living  3     SAB  0   TAB  0   Ectopic  0   Multiple  0   Live Births  3            Home Medications    Prior to Admission medications   Medication Sig Start Date End Date Taking? Authorizing Provider  Blood Glucose Monitoring Suppl (ACCU-CHEK GUIDE) w/Device KIT 1 Device by Does not apply route 4 (four) times daily. Patient not taking: Reported on 05/27/2018 03/18/18   Verita Schneiders A, MD  glucose blood (ACCU-CHEK GUIDE) test strip Use to check blood sugars four times a day was instructed Patient not taking: Reported on 05/27/2018 03/18/18   Anyanwu, Sallyanne Havers, MD  ibuprofen (ADVIL,MOTRIN) 600 MG tablet Take 1 tablet (600 mg total) by mouth every 6 (six) hours as needed. Patient not taking: Reported on 05/27/2018 05/21/18   Woodroe Mode, MD  Levonorgest-Eth Estrad-Fe Bisg (BALCOLTRA) 0.1-20 MG-MCG(21) TABS Take 1 tablet by mouth daily. 06/14/18   Constant, Peggy, MD  metFORMIN (GLUCOPHAGE) 1000 MG tablet Take 1 tablet (1,000 mg total) by mouth 2 (two) times daily with a meal. Patient not taking: Reported on 05/27/2018 05/21/18   Woodroe Mode, MD  Prenatal MV & Min w/FA-DHA (PRENATAL ADULT GUMMY/DHA/FA) 0.4-25 MG CHEW Chew 1 tablet  by mouth daily. Patient not taking: Reported on 05/27/2018 12/10/17   Laury Deep, CNM    Family History Family History  Adopted: Yes  Family history unknown: Yes    Social History Social History   Tobacco Use   Smoking status: Never Smoker   Smokeless tobacco: Never Used  Substance Use Topics   Alcohol use: No   Drug use: No     Allergies   Shellfish-derived products   Review of Systems Review of Systems  Constitutional: Negative for fever.  HENT: Positive for sore throat. Negative for drooling and trouble swallowing.   Respiratory: Negative  for cough and shortness of breath.   Cardiovascular: Negative for chest pain, palpitations and leg swelling.  Gastrointestinal: Negative for abdominal pain.  All other systems reviewed and are negative.    Physical Exam Updated Vital Signs BP (!) 150/109 (BP Location: Right Arm)    Pulse 76    Temp 97.6 F (36.4 C) (Oral)    Resp 19    Ht _0  (1.676 m)    Wt 104.3 kg    SpO2 100%    BMI 37.12 kg/m   Physical Exam Vitals signs and nursing note reviewed.  Constitutional:      General: She is not in acute distress.    Appearance: She is normal weight.  HENT:     Head: Normocephalic and atraumatic.     Nose: Nose normal.     Mouth/Throat:     Pharynx: Oropharynx is clear.     Comments: Intact phonation no pain with displacement of the trachea Eyes:     Conjunctiva/sclera: Conjunctivae normal.     Pupils: Pupils are equal, round, and reactive to light.  Neck:     Musculoskeletal: Normal range of motion and neck supple.  Cardiovascular:     Rate and Rhythm: Normal rate and regular rhythm.     Pulses: Normal pulses.     Heart sounds: Normal heart sounds.  Pulmonary:     Effort: Pulmonary effort is normal.     Breath sounds: Normal breath sounds.  Abdominal:     General: Abdomen is flat. Bowel sounds are normal.     Tenderness: There is no abdominal tenderness. There is no guarding.  Musculoskeletal: Normal range of motion.  Lymphadenopathy:     Cervical: No cervical adenopathy.  Skin:    General: Skin is warm and dry.     Capillary Refill: Capillary refill takes less than 2 seconds.  Neurological:     General: No focal deficit present.     Mental Status: She is alert and oriented to person, place, and time.  Psychiatric:        Mood and Affect: Mood normal.        Behavior: Behavior normal.      ED Treatments / Results  Labs (all labs ordered are listed, but only abnormal results are displayed) Results for orders placed or performed during the hospital encounter  of 09/16/18  SARS Coronavirus 2 [facility] order, Performed in [facility] hospital lab)  Result Value Ref Range   SARS Coronavirus 2 NEGATIVE NEGATIVE   No results found.  Radiology No results found.  Procedures Procedures (including critical care time)  Medications Ordered in ED Medications - No data to display   Patient wanted to see the [facility] from Peds because she was told if she is sick she cannot go upstairs as it is not safe to be around the baby.  The [facility] and the  floor team presented to the patient's room and stated to the patient she cannot be upstairs if she is feeling under the weather.  Covid testing done in the ED and is negative.  Advised to stay home until she feels well.  Tylenol Ibuprofen and salt water gargles.     Based on the centor criteria there is no indication for further testing or treatment Final Clinical Impressions(s) / ED Diagnoses   Final diagnoses:  Viral illness   Return for intractable cough, coughing up blood,fevers >100.4 unrelieved by medication, shortness of breath, intractable vomiting, chest pain, shortness of breath, weakness,numbness, changes in speech, facial asymmetry,abdominal pain, passing out,Inability to tolerate liquids or food, cough, altered mental status or any concerns. No signs of systemic illness or infection. The patient is nontoxic-appearing on exam and vital signs are within normal limits.   I have reviewed the triage vital signs and the nursing notes. Pertinent labs &imaging results that were available during my care of the patient were reviewed by me and considered in my medical decision making (see chart for details).  After history, exam, and medical workup I feel the patient has been appropriately medically screened and is safe for discharge home. Pertinent diagnoses were discussed with the patient. Patient was given return precautions.   Shakeel Disney, MD 09/16/18 [phone]

## 2018-09-16 NOTE — ED Triage Notes (Signed)
Pt complains of sore throat and chest warmth.

## 2018-09-16 NOTE — ED Notes (Signed)
Pt states she was told by [facility] staff that even if her Coronavirus test comes back negative she will still need to self-quarantine herself for 14 days and will still not be able to come to the hospital to see her daughter. Pt advised she understood same.

## 2018-09-16 NOTE — ED Notes (Signed)
Patient verbalizes understanding of discharge instructions. Opportunity for questioning and answers were provided. Armband removed by staff, pt discharged from ED home via POV.  

## 2018-11-11 ENCOUNTER — Other Ambulatory Visit: Payer: Self-pay

## 2018-11-11 ENCOUNTER — Emergency Department (HOSPITAL_COMMUNITY)
Admission: EM | Admit: 2018-11-11 | Discharge: 2018-11-11 | Disposition: A | Discharge status: Home / Self Care | Payer: Medicaid Other | Attending: Emergency Medicine | Admitting: Emergency Medicine

## 2018-11-11 ENCOUNTER — Encounter (HOSPITAL_COMMUNITY): Payer: Self-pay

## 2018-11-11 ENCOUNTER — Emergency Department (HOSPITAL_COMMUNITY): Payer: Medicaid Other

## 2018-11-11 DIAGNOSIS — R05 Cough: Secondary | ICD-10-CM | POA: Insufficient documentation

## 2018-11-11 DIAGNOSIS — Z79899 Other long term (current) drug therapy: Secondary | ICD-10-CM | POA: Insufficient documentation

## 2018-11-11 DIAGNOSIS — R059 Cough, unspecified: Secondary | ICD-10-CM

## 2018-11-11 DIAGNOSIS — R1110 Vomiting, unspecified: Secondary | ICD-10-CM | POA: Diagnosis not present

## 2018-11-11 DIAGNOSIS — E119 Type 2 diabetes mellitus without complications: Secondary | ICD-10-CM | POA: Diagnosis not present

## 2018-11-11 DIAGNOSIS — R197 Diarrhea, unspecified: Secondary | ICD-10-CM | POA: Insufficient documentation

## 2018-11-11 DIAGNOSIS — I10 Essential (primary) hypertension: Secondary | ICD-10-CM | POA: Diagnosis not present

## 2018-11-11 NOTE — ED Triage Notes (Signed)
Pt here for cough and congestion, reports emesis with cough. Pt does have forceful cough with post tussive emesis. Reports also has diarrhea, here with 16 month old that has same symptoms. Per mother.

## 2018-11-11 NOTE — ED Provider Notes (Signed)
[place] EMERGENCY DEPARTMENT Provider Note   CSN: [id number] Arrival date & time: 11/11/18  0419     History   Chief Complaint Chief Complaint  Patient presents with  . Cough    HPI Kathryn Shelton is a 28 y.o. female.     The history is provided by the patient.  Cough Cough characteristics:  Productive Severity:  Moderate Onset quality:  Gradual Timing:  Intermittent Progression:  Worsening Chronicity:  New Relieved by:  Nothing Worsened by:  Nothing Associated symptoms: no chest pain and no fever   Patient presents with cough and diarrhea.  She reports for the past several days she has had cough with some posttussive emesis, and some intermittent episodes of diarrhea.  She reports her child has similar symptoms.  No fever.  No chest pain.  No hemoptysis.  Past Medical History:  Diagnosis Date  . DM (diabetes mellitus), type 2 ([place])   . Essential hypertension 03/01/2017  . GERD (gastroesophageal reflux disease)   . Hypertension   . Nausea and vomiting during pregnancy prior to [redacted] weeks gestation 01/14/2018  . Sleep apnea     Patient Active Problem List   Diagnosis Date Noted  . Oligohydramnios antepartum, second trimester, other fetus 03/09/2018  . Obesity, morbid ([place]) 03/09/2018  . Preterm premature rupture of membranes, unspecified as to length of time between rupture and onset of labor, second trimester 03/09/2018  . Supervision of high risk pregnancy, antepartum 12/31/2017  . Chronic hypertension during pregnancy, antepartum 12/31/2017  . Pre-existing type 2 diabetes mellitus during pregnancy, antepartum 12/31/2017  . Mixed hyperlipidemia 12/02/2017  . Type II diabetes mellitus, uncontrolled ([place]) 03/01/2017  . Essential hypertension 03/01/2017  . Intermittent palpitations 03/01/2017  . Panic attack 03/01/2017  . Obstructive sleep apnea syndrome 03/01/2017    History reviewed. No pertinent surgical history.   OB History    Gravida   3   Para  3   Term  2   Preterm  1   AB  0   Living  3     SAB  0   TAB  0   Ectopic  0   Multiple  0   Live Births  3            Home Medications    Prior to Admission medications   Medication Sig Start Date End Date Taking? Authorizing Provider  Blood Glucose Monitoring Suppl (ACCU-CHEK GUIDE) w/Device KIT 1 Device by Does not apply route 4 (four) times daily. Patient not taking: Reported on 05/27/2018 03/18/18   Verita Schneiders A, MD  glucose blood (ACCU-CHEK GUIDE) test strip Use to check blood sugars four times a day was instructed Patient not taking: Reported on 05/27/2018 03/18/18   Anyanwu, Sallyanne Havers, MD  ibuprofen (ADVIL,MOTRIN) 600 MG tablet Take 1 tablet (600 mg total) by mouth every 6 (six) hours as needed. Patient not taking: Reported on 05/27/2018 05/21/18   Woodroe Mode, MD  Levonorgest-Eth Estrad-Fe Bisg (BALCOLTRA) 0.1-20 MG-MCG(21) TABS Take 1 tablet by mouth daily. 06/14/18   Constant, Peggy, MD  metFORMIN (GLUCOPHAGE) 1000 MG tablet Take 1 tablet (1,000 mg total) by mouth 2 (two) times daily with a meal. Patient not taking: Reported on 05/27/2018 05/21/18   Woodroe Mode, MD  Prenatal MV & Min w/FA-DHA (PRENATAL ADULT GUMMY/DHA/FA) 0.4-25 MG CHEW Chew 1 tablet by mouth daily. Patient not taking: Reported on 05/27/2018 12/10/17   Laury Deep, CNM    Family History Family History  Adopted: Yes  Family history unknown: Yes    Social History Social History   Tobacco Use  . Smoking status: Never Smoker  . Smokeless tobacco: Never Used  Substance Use Topics  . Alcohol use: No  . Drug use: No     Allergies   Shellfish-derived products   Review of Systems Review of Systems  Constitutional: Negative for fever.  Respiratory: Positive for cough.   Cardiovascular: Negative for chest pain.  Gastrointestinal: Positive for diarrhea.  All other systems reviewed and are negative.    Physical Exam Updated Vital Signs BP (!) 131/98    Pulse 82   Temp 98.5 F (36.9 C)   Resp (!) 23   SpO2 100%   Physical Exam  CONSTITUTIONAL: Well developed/well nourished, sleeping on arrival to the room HEAD: Normocephalic/atraumatic EYES: EOMI ENMT: Mucous membranes moist NECK: supple no meningeal signs CV: S1/S2 noted, no murmurs/rubs/gallops noted LUNGS: Lungs are clear to auscultation bilaterally, no apparent distress ABDOMEN: soft, nontender, obese NEURO: Pt is awake/alert/appropriate, moves all extremitiesx4.  No facial droop.   EXTREMITIES: pulses normal/equal, full ROM, no lower extremity edema SKIN: warm, color normal PSYCH: no abnormalities of mood noted, alert and oriented to situation  ED Treatments / Results  Labs (all labs ordered are listed, but only abnormal results are displayed) Labs Reviewed - No data to display  EKG    Radiology Dg Chest Port 1 View  Result Date: 11/11/2018 CLINICAL DATA:  Cough and congestion.  Emesis. EXAM: PORTABLE CHEST 1 VIEW COMPARISON:  Two-view chest x-ray 07/30/2018 FINDINGS: Heart size is normal. Lung volumes are low. There is no edema or effusion. No focal airspace disease is present. The visualized soft tissues and bony thorax are unremarkable. IMPRESSION: 1. Low lung volumes. 2. No acute cardiopulmonary disease. Electronically Signed   By: San Morelle M.D.   On: 11/11/2018 05:42    Procedures Procedures Medications Ordered in ED Medications - No data to display   Initial Impression / Assessment and Plan / ED Course  I have reviewed the triage vital signs and the nursing notes.  Pertinent imaging results that were available during my care of the patient were reviewed by me and considered in my medical decision making (see chart for details).        Patient presents with cough and diarrhea.  She is in no distress.  No hypoxia. X-ray reviewed and is negative.  She was sleeping on initial evaluation.  No signs of respiratory distress. Suspect viral illness.   She declines COVID-19 test  Final Clinical Impressions(s) / ED Diagnoses   Final diagnoses:  Cough  Diarrhea of presumed infectious origin    ED Discharge Orders    None       Ripley Fraise, MD 11/11/18 [phone]

## 2018-11-16 IMAGING — CR DG ANKLE COMPLETE 3+V*L*
3 series · 3 of 3 positions shown · non-contrast
Comparison: None.

CLINICAL DATA: Left lateral ankle pain.  Pain for few days.

EXAM:
LEFT ANKLE COMPLETE - 3+ VIEW

[x ankle ap left]
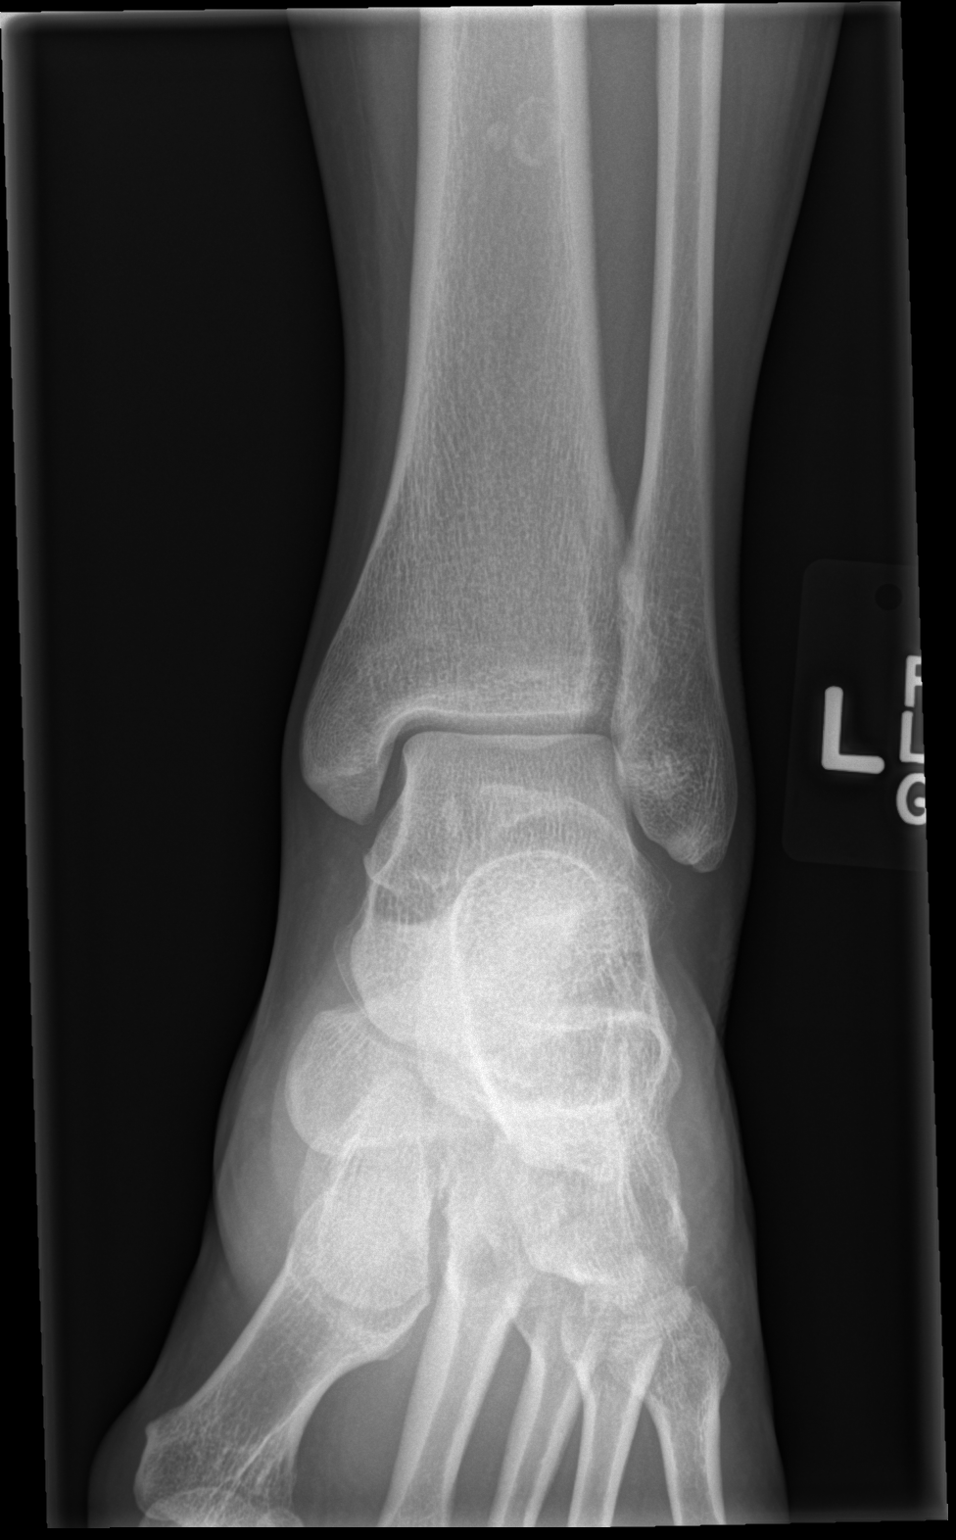

[x ankle obl left]
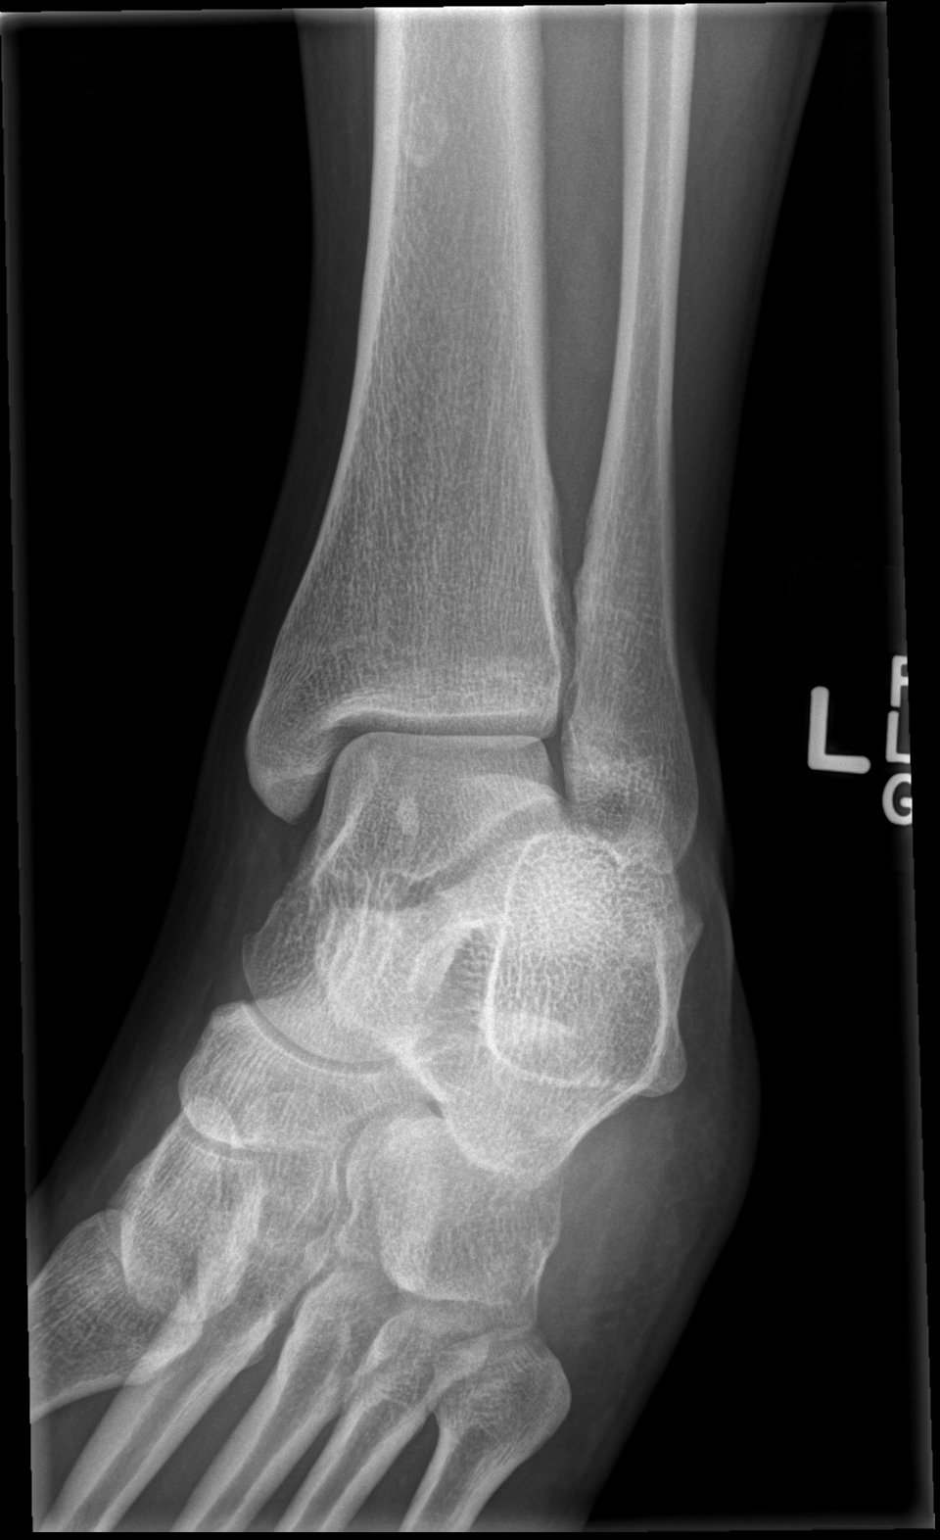

[x ankle lat left]
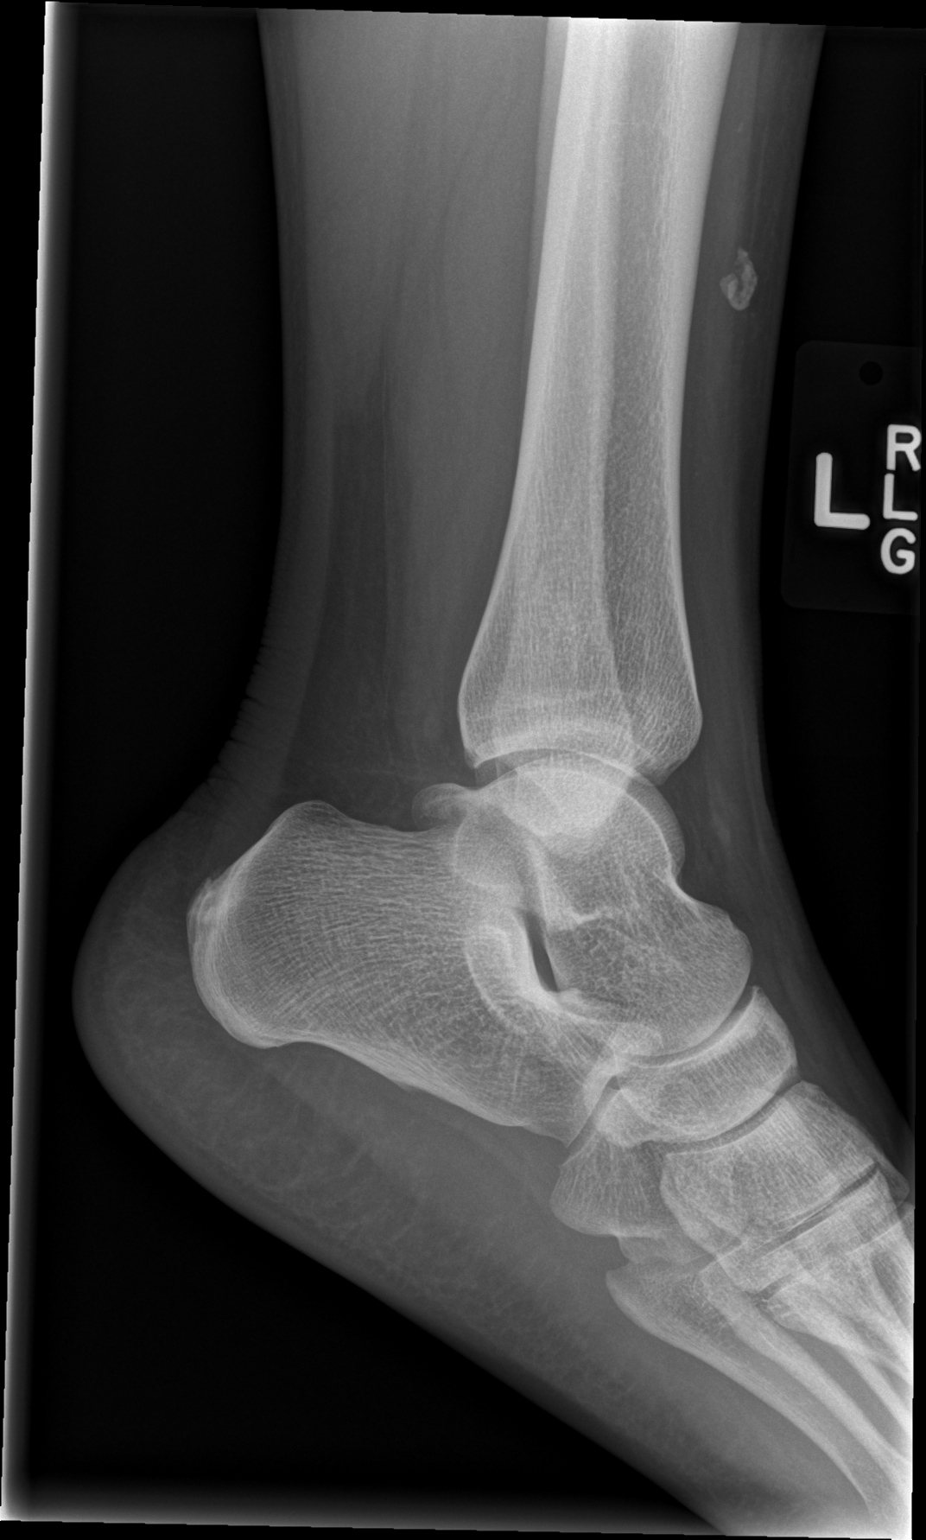

[3 of 3 positions shown; findings below may reference images not displayed]

FINDINGS: There is no evidence of fracture, dislocation, or joint effusion.
There is no evidence of arthropathy or other focal bone abnormality.
Dystrophic calcification in the distal anterior tibial soft tissues.
No other focal soft tissue abnormality.
IMPRESSION: No acute osseous injury of the left ankle.

## 2019-01-10 ENCOUNTER — Other Ambulatory Visit: Payer: Self-pay

## 2019-01-10 ENCOUNTER — Emergency Department (HOSPITAL_COMMUNITY): Payer: Medicaid Other

## 2019-01-10 ENCOUNTER — Encounter (HOSPITAL_COMMUNITY): Payer: Self-pay | Admitting: Emergency Medicine

## 2019-01-10 ENCOUNTER — Emergency Department (HOSPITAL_COMMUNITY)
Admission: EM | Admit: 2019-01-10 | Discharge: 2019-01-11 | Disposition: A | Discharge status: Home / Self Care | Payer: Medicaid Other | Attending: Emergency Medicine | Admitting: Emergency Medicine

## 2019-01-10 DIAGNOSIS — E1165 Type 2 diabetes mellitus with hyperglycemia: Secondary | ICD-10-CM | POA: Diagnosis present

## 2019-01-10 DIAGNOSIS — E669 Obesity, unspecified: Secondary | ICD-10-CM | POA: Diagnosis not present

## 2019-01-10 DIAGNOSIS — I158 Other secondary hypertension: Secondary | ICD-10-CM | POA: Diagnosis not present

## 2019-01-10 DIAGNOSIS — R739 Hyperglycemia, unspecified: Secondary | ICD-10-CM

## 2019-01-10 DIAGNOSIS — Z7984 Long term (current) use of oral hypoglycemic drugs: Secondary | ICD-10-CM | POA: Diagnosis not present

## 2019-01-10 DIAGNOSIS — Z793 Long term (current) use of hormonal contraceptives: Secondary | ICD-10-CM | POA: Insufficient documentation

## 2019-01-10 DIAGNOSIS — Z6837 Body mass index (BMI) 37.0-37.9, adult: Secondary | ICD-10-CM | POA: Insufficient documentation

## 2019-01-10 DIAGNOSIS — Z79899 Other long term (current) drug therapy: Secondary | ICD-10-CM | POA: Diagnosis not present

## 2019-01-10 LAB — CBC
HCT: 39.5 % (ref 36.0–46.0)
Hemoglobin: 13 g/dL (ref 12.0–15.0)
MCH: 26.4 pg (ref 26.0–34.0)
MCHC: 32.9 g/dL (ref 30.0–36.0)
MCV: 80.1 fL (ref 80.0–100.0)
Platelets: 347 10*3/uL (ref 150–400)
RBC: 4.93 MIL/uL (ref 3.87–5.11)
RDW: 13.5 % (ref 11.5–15.5)
WBC: 4.8 10*3/uL (ref 4.0–10.5)
nRBC: 0 % (ref 0.0–0.2)

## 2019-01-10 LAB — I-STAT BETA HCG BLOOD, ED (MC, WL, AP ONLY): I-stat hCG, quantitative: <5 m[IU]/mL (ref <5)

## 2019-01-10 MED ORDER — SODIUM CHLORIDE 0.9% FLUSH
3.0000 mL | Freq: Once | INTRAVENOUS | Status: AC
  Administered 2019-01-11: 3 mL via INTRAVENOUS

## 2019-01-10 NOTE — ED Triage Notes (Signed)
Pt c/o feeling hot all over, pt states she knew had HTN but does not go to a doctor to get meds. Pt hocking up phlegm while attempting to triage patient but denies cough.

## 2019-01-11 LAB — TROPONIN I (HIGH SENSITIVITY)
Troponin I (High Sensitivity): 9 ng/L (ref <18)
Troponin I (High Sensitivity): 8 ng/L (ref <18)

## 2019-01-11 LAB — URINALYSIS, ROUTINE W REFLEX MICROSCOPIC
Bacteria, UA: NONE SEEN
Bilirubin Urine: NEGATIVE
Glucose, UA: >=500 mg/dL — AB
Ketones, ur: NEGATIVE mg/dL
Leukocytes,Ua: NEGATIVE
Nitrite: NEGATIVE
Protein, ur: 100 mg/dL — AB
Specific Gravity, Urine: 1.021 (ref 1.005–1.030)
pH: 6 (ref 5.0–8.0)

## 2019-01-11 LAB — BASIC METABOLIC PANEL
Anion gap: 11 (ref 5–15)
BUN: 17 mg/dL (ref 6–20)
CO2: 21 mmol/L — ABNORMAL LOW (ref 22–32)
Calcium: 9.1 mg/dL (ref 8.9–10.3)
Chloride: 97 mmol/L — ABNORMAL LOW (ref 98–111)
Creatinine, Ser: 1.42 mg/dL — ABNORMAL HIGH (ref 0.44–1.00)
GFR calc Af Amer: 57 mL/min — ABNORMAL LOW (ref >60)
GFR calc non Af Amer: 49 mL/min — ABNORMAL LOW (ref >60)
Glucose, Bld: 620 mg/dL (ref 70–99)
Potassium: 3.8 mmol/L (ref 3.5–5.1)
Sodium: 129 mmol/L — ABNORMAL LOW (ref 135–145)

## 2019-01-11 LAB — CBG MONITORING, ED
Glucose-Capillary: 401 mg/dL — ABNORMAL HIGH (ref 70–99)
Glucose-Capillary: 294 mg/dL — ABNORMAL HIGH (ref 70–99)

## 2019-01-11 MED ORDER — AMLODIPINE BESYLATE 5 MG PO TABS: 5.0000 mg | ORAL_TABLET | Freq: Every day | ORAL | 1 refills | Status: DC

## 2019-01-11 MED ORDER — METFORMIN HCL 1000 MG PO TABS: 1000.0000 mg | ORAL_TABLET | Freq: Two times a day (BID) | ORAL | 1 refills | Status: DC

## 2019-01-11 MED ORDER — INSULIN ASPART 100 UNIT/ML ~~LOC~~ SOLN
10.0000 [IU] | Freq: Once | SUBCUTANEOUS | Status: AC
  Administered 2019-01-11: 10 [IU] via INTRAVENOUS

## 2019-01-11 MED ORDER — SODIUM CHLORIDE 0.9 % IV BOLUS
1000.0000 mL | Freq: Once | INTRAVENOUS | Status: AC
  Administered 2019-01-11: 03:00:00 1000 mL via INTRAVENOUS

## 2019-01-11 MED ORDER — AMLODIPINE BESYLATE 5 MG PO TABS
5.0000 mg | ORAL_TABLET | Freq: Once | ORAL | Status: AC
  Administered 2019-01-11: 5 mg via ORAL
  Filled 2019-01-11: qty 1

## 2019-01-11 NOTE — ED Provider Notes (Signed)
[facility] EMERGENCY DEPARTMENT Provider Note  CSN: [id number] Arrival date & time: 01/10/19 2238  Chief Complaint(s) Hypertension and Hyperglycemia  HPI Kathryn Shelton is a 28 y.o. female who presents to the emergency department with generalized malaise and hot flash.  Patient reports history of intermittent hot flashes that are short-lived.  States that today she has just not felt well.  Denies any fevers or chills.  No nausea or vomiting.  No chest pain or shortness of breath.  No cough or congestion.  No abdominal pain.  No diarrhea.  No urinary symptoms.  She did report increased thirst. She does feel like she is dehydrated.  Reports that she has a history of hypertension and diabetes but was taken off of her medications during her pregnancy and did not need to go back on them.  HPI  Past Medical History Past Medical History:  Diagnosis Date  . DM (diabetes mellitus), type 2 ([place])   . Essential hypertension 03/01/2017  . GERD (gastroesophageal reflux disease)   . Hypertension   . Nausea and vomiting during pregnancy prior to [redacted] weeks gestation 01/14/2018  . Sleep apnea    Patient Active Problem List   Diagnosis Date Noted  . Oligohydramnios antepartum, second trimester, other fetus 03/09/2018  . Obesity, morbid ([place]) 03/09/2018  . Preterm premature rupture of membranes, unspecified as to length of time between rupture and onset of labor, second trimester 03/09/2018  . Supervision of high risk pregnancy, antepartum 12/31/2017  . Chronic hypertension during pregnancy, antepartum 12/31/2017  . Pre-existing type 2 diabetes mellitus during pregnancy, antepartum 12/31/2017  . Mixed hyperlipidemia 12/02/2017  . Type II diabetes mellitus, uncontrolled ([place]) 03/01/2017  . Essential hypertension 03/01/2017  . Intermittent palpitations 03/01/2017  . Panic attack 03/01/2017  . Obstructive sleep apnea syndrome 03/01/2017   Home Medication(s) Prior to Admission  medications   Medication Sig Start Date End Date Taking? Authorizing Provider  amLODipine (NORVASC) 5 MG tablet Take 1 tablet (5 mg total) by mouth daily. 01/11/19 02/10/19  Fatima Blank, MD  Blood Glucose Monitoring Suppl (ACCU-CHEK GUIDE) w/Device KIT 1 Device by Does not apply route 4 (four) times daily. Patient not taking: Reported on 05/27/2018 03/18/18   Verita Schneiders A, MD  glucose blood (ACCU-CHEK GUIDE) test strip Use to check blood sugars four times a day was instructed Patient not taking: Reported on 05/27/2018 03/18/18   Anyanwu, Sallyanne Havers, MD  ibuprofen (ADVIL,MOTRIN) 600 MG tablet Take 1 tablet (600 mg total) by mouth every 6 (six) hours as needed. Patient not taking: Reported on 05/27/2018 05/21/18   Woodroe Mode, MD  Levonorgest-Eth Estrad-Fe Bisg (BALCOLTRA) 0.1-20 MG-MCG(21) TABS Take 1 tablet by mouth daily. 06/14/18   Constant, Peggy, MD  metFORMIN (GLUCOPHAGE) 1000 MG tablet Take 1 tablet (1,000 mg total) by mouth 2 (two) times daily with a meal. 01/11/19   Amilio Zehnder, Grayce Sessions, MD  Prenatal MV & Min w/FA-DHA (PRENATAL ADULT GUMMY/DHA/FA) 0.4-25 MG CHEW Chew 1 tablet by mouth daily. Patient not taking: Reported on 05/27/2018 12/10/17   Laury Deep, CNM  Past Surgical History History reviewed. No pertinent surgical history. Family History Family History  Adopted: Yes  Family history unknown: Yes    Social History Social History   Tobacco Use  . Smoking status: Never Smoker  . Smokeless tobacco: Never Used  Substance Use Topics  . Alcohol use: No  . Drug use: No   Allergies Shellfish-derived products  Review of Systems Review of Systems All other systems are reviewed and are negative for acute change except as noted in the HPI  Physical Exam Vital Signs  I have reviewed the triage vital signs BP (!) 162/113   Pulse 88    Temp 97.9 F (36.6 C) (Oral)   Resp 20   Ht _0  (1.676 m)   Wt 106 kg   LMP 01/03/2019 (Approximate)   SpO2 96%   BMI 37.72 kg/m   Physical Exam Vitals signs reviewed.  Constitutional:      General: She is not in acute distress.    Appearance: She is well-developed. She is obese. She is not diaphoretic.  HENT:     Head: Normocephalic and atraumatic.     Nose: Nose normal.  Eyes:     General: No scleral icterus.       Right eye: No discharge.        Left eye: No discharge.     Conjunctiva/sclera: Conjunctivae normal.     Pupils: Pupils are equal, round, and reactive to light.  Neck:     Musculoskeletal: Normal range of motion and neck supple.  Cardiovascular:     Rate and Rhythm: Normal rate and regular rhythm.     Heart sounds: No murmur. No friction rub. No gallop.   Pulmonary:     Effort: Pulmonary effort is normal. No respiratory distress.     Breath sounds: Normal breath sounds. No stridor. No rales.  Abdominal:     General: There is no distension.     Palpations: Abdomen is soft.     Tenderness: There is no abdominal tenderness.  Musculoskeletal:        General: No tenderness.  Skin:    General: Skin is warm and dry.     Findings: No erythema or rash.  Neurological:     Mental Status: She is alert and oriented to person, place, and time.     ED Results and Treatments Labs (all labs ordered are listed, but only abnormal results are displayed) Labs Reviewed  BASIC METABOLIC PANEL - Abnormal; Notable for the following components:      Result Value   Sodium 129 (*)    Chloride 97 (*)    CO2 21 (*)    Glucose, Bld 620 (*)    Creatinine, Ser 1.42 (*)    GFR calc non Af Amer 49 (*)    GFR calc Af Amer 57 (*)    All other components within normal limits  URINALYSIS, ROUTINE W REFLEX MICROSCOPIC - Abnormal; Notable for the following components:   Color, Urine STRAW (*)    Glucose, UA >=500 (*)    Hgb urine dipstick SMALL (*)    Protein, ur 100 (*)    All  other components within normal limits  CBG MONITORING, ED - Abnormal; Notable for the following components:   Glucose-Capillary 401 (*)    All other components within normal limits  CBG MONITORING, ED - Abnormal; Notable for the following components:   Glucose-Capillary 294 (*)    All other components within normal limits  CBC  I-STAT BETA HCG BLOOD, ED ([facility], [facility], [facility] ONLY)  TROPONIN I (HIGH SENSITIVITY)  TROPONIN I (HIGH SENSITIVITY)                                                                                                                         EKG  EKG Interpretation  Date/Time:  Monday January 10 2019 23:19:08 EDT Ventricular Rate:  102 PR Interval:  160 QRS Duration: 80 QT Interval:  342 QTC Calculation: 445 R Axis:   -81 Text Interpretation:  Sinus tachycardia Left axis deviation Anterior infarct , age undetermined Abnormal ECG No significant change since last tracing Confirmed by Merrily Pew [phone]) on 01/10/2019 11:30:31 PM      Radiology Dg Chest 2 View  Result Date: 01/10/2019 CLINICAL DATA:  Cough, hypertension EXAM: CHEST - 2 VIEW COMPARISON:  Radiograph November 11, 2018 FINDINGS: No consolidation, features of edema, pneumothorax, or effusion. Pulmonary vascularity is normally distributed. The cardiomediastinal contours are unremarkable. No acute osseous or soft tissue abnormality. IMPRESSION: No acute cardiopulmonary disease. Electronically Signed   By: Lovena Le M.D.   On: 01/10/2019 23:59    Pertinent labs & imaging results that were available during my care of the patient were reviewed by me and considered in my medical decision making (see chart for details).  Medications Ordered in ED Medications  sodium chloride flush (NS) 0.9 % injection 3 mL (3 mLs Intravenous Given 01/11/19 0237)  sodium chloride 0.9 % bolus 1,000 mL (0 mLs Intravenous Stopped 01/11/19 0337)  insulin aspart (novoLOG) injection 10 Units (10 Units Intravenous Given 01/11/19 0252)  amLODipine  (NORVASC) tablet 5 mg (5 mg Oral Given 01/11/19 0245)                                                                                                                                    Procedures Procedures  (including critical care time)  Medical Decision Making / ED Course I have reviewed the nursing notes for this encounter and the patient's prior records (if available in EHR or on provided paperwork).   Kathryn Shelton was evaluated in Emergency Department on 01/11/2019 for the symptoms described in the history of present illness. She was evaluated in the context of the global COVID-19 pandemic, which necessitated consideration that the patient might be at risk for infection with the SARS-CoV-2 virus that causes COVID-19. Institutional protocols and algorithms that pertain to the  evaluation of patients at risk for COVID-19 are in a state of rapid change based on information released by regulatory bodies including the CDC and federal and state organizations. These policies and algorithms were followed during the patient's care in the ED.  CC: generalized fatigue/malaise  Record review:  Patient has had postpartum clinic visits and noted to have normal blood pressures.  Last glucose check was 5 months ago and was in the 140s.  Patient was previously on lisinopril and metformin, not on those medicines at that time.   Work up as above, notable for:  Notable for hyperglycemia without evidence of DKA.  Mild AKI likely due to dehydration.  Rest of the labs are reassuring without leukocytosis or anemia.  Electrolyte derangements due to hyperglycemia.  Beta-hCG negative.  UA w/o infection  **Labs & imaging results that were available during my care of the patient were reviewed by me and considered in my medical decision making.    Management: Patient provided with IV fluids and single dose of insulin. Given amlodipine for BP  Reassessment:  BS improved.  The patient appears reasonably  screened and/or stabilized for discharge and I doubt any other medical condition or other [facility] requiring further screening, evaluation, or treatment in the ED at this time prior to discharge.  The patient is safe for discharge with strict return precautions.      Final Clinical Impression(s) / ED Diagnoses Final diagnoses:  Hyperglycemia  Other secondary hypertension     The patient appears reasonably screened and/or stabilized for discharge and I doubt any other medical condition or other [facility] requiring further screening, evaluation, or treatment in the ED at this time prior to discharge.  Disposition: Discharge  Condition: Good  I have discussed the results, Dx and Tx plan with the patient who expressed understanding and agree(s) with the plan. Discharge instructions discussed at great length. The patient was given strict return precautions who verbalized understanding of the instructions. No further questions at time of discharge.    ED Discharge Orders         Ordered    metFORMIN (GLUCOPHAGE) 1000 MG tablet  2 times daily with meals     01/11/19 0342    amLODipine (NORVASC) 5 MG tablet  Daily     01/11/19 8527           Follow Up: Audley Hose, MD [place] [address] [phone]  Schedule an appointment as soon as possible for a visit  For close follow up to continue management of diabetes and high blood pressure     This chart was dictated using voice recognition software.  Despite best efforts to proofread,  errors can occur which can change the documentation meaning.   Fatima Blank, MD 01/11/19 [phone]

## 2019-01-11 NOTE — ED Notes (Signed)
Informed by [facility] staff that pt had left. Pt had a newborn baby in lobby and was asked by staff if someone else could care for child; pt decided to leave since she has no one to babysit. I called pt back due to concerning labs; pt able to come back; room held for pt to be seen

## 2019-01-11 NOTE — ED Notes (Signed)
Patient verbalizes understanding of discharge instructions. Opportunity for questioning and answers were provided. Armband removed by staff, pt discharged from ED ambulatory.   

## 2019-01-11 NOTE — Discharge Instructions (Addendum)
Rx for Metformin (for diabetes) and Amlodipine (for high blood pressure) have been sent to the pharmacy. Please pick them up and start taking as directed.

## 2019-01-13 ENCOUNTER — Emergency Department (HOSPITAL_COMMUNITY)
Admission: EM | Admit: 2019-01-13 | Discharge: 2019-01-14 | Disposition: A | Discharge status: Home / Self Care | Payer: Medicaid Other | Attending: Emergency Medicine | Admitting: Emergency Medicine

## 2019-01-13 DIAGNOSIS — I10 Essential (primary) hypertension: Secondary | ICD-10-CM

## 2019-01-13 DIAGNOSIS — Z7984 Long term (current) use of oral hypoglycemic drugs: Secondary | ICD-10-CM | POA: Diagnosis not present

## 2019-01-13 DIAGNOSIS — Z79899 Other long term (current) drug therapy: Secondary | ICD-10-CM | POA: Diagnosis not present

## 2019-01-13 DIAGNOSIS — E1165 Type 2 diabetes mellitus with hyperglycemia: Secondary | ICD-10-CM | POA: Insufficient documentation

## 2019-01-13 DIAGNOSIS — R631 Polydipsia: Secondary | ICD-10-CM | POA: Diagnosis present

## 2019-01-13 DIAGNOSIS — R739 Hyperglycemia, unspecified: Secondary | ICD-10-CM

## 2019-01-13 DIAGNOSIS — E860 Dehydration: Secondary | ICD-10-CM | POA: Insufficient documentation

## 2019-01-13 LAB — CBG MONITORING, ED: Glucose-Capillary: 391 mg/dL — ABNORMAL HIGH (ref 70–99)

## 2019-01-13 NOTE — ED Triage Notes (Signed)
  Patient BIB EMS for hyperglycemia and HTN.  Patient was seen a few days ago for same and was going to be admitted but refused due to no child care.  Patient has complaints of polyphagia, polydipsia, and polyuria.  EMS CBG 541,  CBG 391 on arrival.  Patient states she is also out of her HTN medications.  Patient is A&O x4.  No kussmaul respirations.  HR 90.

## 2019-01-14 ENCOUNTER — Encounter (HOSPITAL_COMMUNITY): Payer: Self-pay | Admitting: Emergency Medicine

## 2019-01-14 ENCOUNTER — Other Ambulatory Visit: Payer: Self-pay

## 2019-01-14 LAB — CBG MONITORING, ED
Glucose-Capillary: 317 mg/dL — ABNORMAL HIGH (ref 70–99)
Glucose-Capillary: 257 mg/dL — ABNORMAL HIGH (ref 70–99)
Glucose-Capillary: 327 mg/dL — ABNORMAL HIGH (ref 70–99)

## 2019-01-14 LAB — BASIC METABOLIC PANEL
Anion gap: 10 (ref 5–15)
BUN: 13 mg/dL (ref 6–20)
CO2: 24 mmol/L (ref 22–32)
Calcium: 8.7 mg/dL — ABNORMAL LOW (ref 8.9–10.3)
Chloride: 100 mmol/L (ref 98–111)
Creatinine, Ser: 1.05 mg/dL — ABNORMAL HIGH (ref 0.44–1.00)
Glucose, Bld: 406 mg/dL — ABNORMAL HIGH (ref 70–99)
Potassium: 3.5 mmol/L (ref 3.5–5.1)
Sodium: 134 mmol/L — ABNORMAL LOW (ref 135–145)

## 2019-01-14 LAB — CBC WITH DIFFERENTIAL/PLATELET
Abs Immature Granulocytes: 0.01 10*3/uL (ref 0.00–0.07)
Basophils Absolute: 0 10*3/uL (ref 0.0–0.1)
Basophils Relative: 0 %
Eosinophils Absolute: 0 10*3/uL (ref 0.0–0.5)
Eosinophils Relative: 0 %
HCT: 38.8 % (ref 36.0–46.0)
Hemoglobin: 12.8 g/dL (ref 12.0–15.0)
Immature Granulocytes: 0 %
Lymphocytes Relative: 31 %
Lymphs Abs: 1.7 10*3/uL (ref 0.7–4.0)
MCH: 26.6 pg (ref 26.0–34.0)
MCHC: 33 g/dL (ref 30.0–36.0)
MCV: 80.7 fL (ref 80.0–100.0)
Monocytes Absolute: 0.4 10*3/uL (ref 0.1–1.0)
Monocytes Relative: 7 %
Neutro Abs: 3.3 10*3/uL (ref 1.7–7.7)
Neutrophils Relative %: 62 %
Platelets: 336 10*3/uL (ref 150–400)
RBC: 4.81 MIL/uL (ref 3.87–5.11)
RDW: 13.6 % (ref 11.5–15.5)
WBC: 5.5 10*3/uL (ref 4.0–10.5)
nRBC: 0 % (ref 0.0–0.2)

## 2019-01-14 MED ORDER — SODIUM CHLORIDE 0.9 % IV BOLUS (SEPSIS)
1000.0000 mL | Freq: Once | INTRAVENOUS | Status: AC
  Administered 2019-01-14: 01:00:00 1000 mL via INTRAVENOUS

## 2019-01-14 MED ORDER — SODIUM CHLORIDE 0.9 % IV BOLUS (SEPSIS)
1000.0000 mL | Freq: Once | INTRAVENOUS | Status: AC
  Administered 2019-01-14: 1000 mL via INTRAVENOUS

## 2019-01-14 MED ORDER — LACTATED RINGERS IV BOLUS
1000.0000 mL | Freq: Once | INTRAVENOUS | Status: AC
  Administered 2019-01-14: 1000 mL via INTRAVENOUS

## 2019-01-14 MED ORDER — LORAZEPAM 2 MG/ML IJ SOLN
1.0000 mg | Freq: Once | INTRAMUSCULAR | Status: AC
  Administered 2019-01-14: 1 mg via INTRAVENOUS
  Filled 2019-01-14: qty 1

## 2019-01-14 NOTE — ED Provider Notes (Signed)
[place] EMERGENCY DEPARTMENT Provider Note   CSN: [id number] Arrival date & time: 01/13/19  2349     History   Chief Complaint Chief Complaint  Patient presents with  . Hyperglycemia  . Weakness    HPI Kathryn Shelton is a 28 y.o. female.      Hyperglycemia Severity:  Moderate Onset quality:  Gradual Timing:  Constant Progression:  Worsening Chronicity:  Recurrent Relieved by:  Nothing Ineffective treatments:  None tried Associated symptoms: fatigue, increased thirst, polyuria and weakness   Associated symptoms: no abdominal pain and no fever   Weakness Associated symptoms: no abdominal pain, no cough and no fever   Patient with history of diabetes and hypertension presents with hyperglycemia. Patient reports feeling thirsty and polyuria.  She reports she did not have her medications.  She was seen recently for this. No fevers or vomiting.  No chest pain.  No abdominal pain.  Past Medical History:  Diagnosis Date  . DM (diabetes mellitus), type 2 ([place])   . Essential hypertension 03/01/2017  . GERD (gastroesophageal reflux disease)   . Hypertension   . Nausea and vomiting during pregnancy prior to [redacted] weeks gestation 01/14/2018  . Sleep apnea     Patient Active Problem List   Diagnosis Date Noted  . Oligohydramnios antepartum, second trimester, other fetus 03/09/2018  . Obesity, morbid ([place]) 03/09/2018  . Preterm premature rupture of membranes, unspecified as to length of time between rupture and onset of labor, second trimester 03/09/2018  . Supervision of high risk pregnancy, antepartum 12/31/2017  . Chronic hypertension during pregnancy, antepartum 12/31/2017  . Pre-existing type 2 diabetes mellitus during pregnancy, antepartum 12/31/2017  . Mixed hyperlipidemia 12/02/2017  . Type II diabetes mellitus, uncontrolled ([place]) 03/01/2017  . Essential hypertension 03/01/2017  . Intermittent palpitations 03/01/2017  . Panic attack 03/01/2017  .  Obstructive sleep apnea syndrome 03/01/2017    History reviewed. No pertinent surgical history.   OB History    Gravida  3   Para  3   Term  2   Preterm  1   AB  0   Living  3     SAB  0   TAB  0   Ectopic  0   Multiple  0   Live Births  3            Home Medications    Prior to Admission medications   Medication Sig Start Date End Date Taking? Authorizing Provider  metFORMIN (GLUCOPHAGE) 1000 MG tablet Take 1 tablet (1,000 mg total) by mouth 2 (two) times daily with a meal. 01/11/19  Yes Cardama, Grayce Sessions, MD  amLODipine (NORVASC) 5 MG tablet Take 1 tablet (5 mg total) by mouth daily. 01/11/19 02/10/19  Fatima Blank, MD  Blood Glucose Monitoring Suppl (ACCU-CHEK GUIDE) w/Device KIT 1 Device by Does not apply route 4 (four) times daily. Patient not taking: Reported on 05/27/2018 03/18/18   Verita Schneiders A, MD  glucose blood (ACCU-CHEK GUIDE) test strip Use to check blood sugars four times a day was instructed Patient not taking: Reported on 05/27/2018 03/18/18   Anyanwu, Sallyanne Havers, MD  ibuprofen (ADVIL,MOTRIN) 600 MG tablet Take 1 tablet (600 mg total) by mouth every 6 (six) hours as needed. Patient not taking: Reported on 05/27/2018 05/21/18   Woodroe Mode, MD  Levonorgest-Eth Estrad-Fe Bisg (BALCOLTRA) 0.1-20 MG-MCG(21) TABS Take 1 tablet by mouth daily. Patient not taking: Reported on 01/14/2019 06/14/18   Constant, Vickii Chafe, MD  Prenatal MV & Min w/FA-DHA (PRENATAL ADULT GUMMY/DHA/FA) 0.4-25 MG CHEW Chew 1 tablet by mouth daily. Patient not taking: Reported on 05/27/2018 12/10/17   Laury Deep, CNM    Family History Family History  Adopted: Yes  Family history unknown: Yes    Social History Social History   Tobacco Use  . Smoking status: Never Smoker  . Smokeless tobacco: Never Used  Substance Use Topics  . Alcohol use: No  . Drug use: No     Allergies   Shellfish-derived products   Review of Systems Review of Systems   Constitutional: Positive for fatigue. Negative for fever.  Respiratory: Negative for cough.   Gastrointestinal: Negative for abdominal pain.  Endocrine: Positive for polydipsia and polyuria.  Neurological: Positive for weakness.  All other systems reviewed and are negative.    Physical Exam Updated Vital Signs BP (!) 163/109 (BP Location: Right Arm)   Pulse 88   Temp 98.1 F (36.7 C) (Oral)   Resp 18   Ht 1.676 m (5' 6" )   Wt 108.9 kg   LMP 01/03/2019 (Approximate)   SpO2 97%   BMI 38.74 kg/m   Physical Exam CONSTITUTIONAL: Well developed/well nourished HEAD: Normocephalic/atraumatic EYES: EOMI/PERRL ENMT: Mucous membranes dry NECK: supple no meningeal signs SPINE/BACK:entire spine nontender CV: S1/S2 noted, no murmurs/rubs/gallops noted LUNGS: Lungs are clear to auscultation bilaterally, no apparent distress ABDOMEN: soft, nontender, no rebound or guarding, bowel sounds noted throughout abdomen GU:no cva tenderness NEURO: Pt is awake/alert/appropriate, moves all extremitiesx4.  No facial droop.   EXTREMITIES: pulses normal/equal, full ROM SKIN: warm, color normal PSYCH: no abnormalities of mood noted, alert and oriented to situation   ED Treatments / Results  Labs (all labs ordered are listed, but only abnormal results are displayed) Labs Reviewed  BASIC METABOLIC PANEL - Abnormal; Notable for the following components:      Result Value   Sodium 134 (*)    Glucose, Bld 406 (*)    Creatinine, Ser 1.05 (*)    Calcium 8.7 (*)    All other components within normal limits  CBG MONITORING, ED - Abnormal; Notable for the following components:   Glucose-Capillary 391 (*)    All other components within normal limits  CBG MONITORING, ED - Abnormal; Notable for the following components:   Glucose-Capillary 317 (*)    All other components within normal limits  CBG MONITORING, ED - Abnormal; Notable for the following components:   Glucose-Capillary 327 (*)    All other  components within normal limits  CBG MONITORING, ED - Abnormal; Notable for the following components:   Glucose-Capillary 257 (*)    All other components within normal limits  CBC WITH DIFFERENTIAL/PLATELET    EKG None  Radiology No results found.  Procedures Procedures   Medications Ordered in ED Medications  sodium chloride 0.9 % bolus 1,000 mL (0 mLs Intravenous Stopped 01/14/19 0134)  LORazepam (ATIVAN) injection 1 mg (1 mg Intravenous Given 01/14/19 0134)  lactated ringers bolus 1,000 mL (0 mLs Intravenous Stopped 01/14/19 0335)  sodium chloride 0.9 % bolus 1,000 mL (0 mLs Intravenous Stopped 01/14/19 0430)     Initial Impression / Assessment and Plan / ED Course  I have reviewed the triage vital signs and the nursing notes.  Pertinent labs results that were available during my care of the patient were reviewed by me and considered in my medical decision making (see chart for details).        12:28 AM Patient presents for repeat  ER visit.  She was seen on August 17 for hyperglycemia, she was discharged home but her glucose is still elevated. She appears dehydrated but otherwise stable.  Low suspicion for DKA Labs and EKG from previous visit were reviewed Patient reports she has metformin waiting on her at her pharmacy She has had difficulty with PCP followup due to financial issues    Patient felt improved.  She received 3 L of fluid and has improved.  Glucose improving.  No signs of DKA.  She is ambulatory.  Discussed need to continue her metformin at home.  We discussed dietary changes. Patient reports difficulty seeing her PCP.  She will referred to [facility] health [facility] wellness in the next 2 to 3 weeks.  She may end up requiring insulin and I discussed this with patient.  Overall she is in no acute distress and is appropriate discharge Final Clinical Impressions(s) / ED Diagnoses   Final diagnoses:  Dehydration  Hyperglycemia  Essential hypertension    ED  Discharge Orders    None       Ripley Fraise, MD 01/14/19 [phone]

## 2019-03-07 ENCOUNTER — Emergency Department (HOSPITAL_COMMUNITY)
Admission: EM | Admit: 2019-03-07 | Discharge: 2019-03-07 | Disposition: A | Discharge status: Home / Self Care | Payer: Medicaid Other | Attending: Emergency Medicine | Admitting: Emergency Medicine

## 2019-03-07 ENCOUNTER — Encounter (HOSPITAL_COMMUNITY): Payer: Self-pay | Admitting: Emergency Medicine

## 2019-03-07 ENCOUNTER — Other Ambulatory Visit: Payer: Self-pay

## 2019-03-07 DIAGNOSIS — E1165 Type 2 diabetes mellitus with hyperglycemia: Secondary | ICD-10-CM | POA: Diagnosis present

## 2019-03-07 DIAGNOSIS — I10 Essential (primary) hypertension: Secondary | ICD-10-CM | POA: Insufficient documentation

## 2019-03-07 DIAGNOSIS — Z9114 Patient's other noncompliance with medication regimen: Secondary | ICD-10-CM | POA: Insufficient documentation

## 2019-03-07 DIAGNOSIS — R739 Hyperglycemia, unspecified: Secondary | ICD-10-CM

## 2019-03-07 DIAGNOSIS — R358 Other polyuria: Secondary | ICD-10-CM | POA: Insufficient documentation

## 2019-03-07 LAB — BASIC METABOLIC PANEL
Anion gap: 8 (ref 5–15)
BUN: 11 mg/dL (ref 6–20)
CO2: 24 mmol/L (ref 22–32)
Calcium: 8.5 mg/dL — ABNORMAL LOW (ref 8.9–10.3)
Chloride: 101 mmol/L (ref 98–111)
Creatinine, Ser: 0.95 mg/dL (ref 0.44–1.00)
GFR calc Af Amer: >60 mL/min (ref >60)
GFR calc non Af Amer: >60 mL/min (ref >60)
Glucose, Bld: 376 mg/dL — ABNORMAL HIGH (ref 70–99)
Potassium: 4 mmol/L (ref 3.5–5.1)
Sodium: 133 mmol/L — ABNORMAL LOW (ref 135–145)

## 2019-03-07 LAB — CBC WITH DIFFERENTIAL/PLATELET
Abs Immature Granulocytes: 0.01 10*3/uL (ref 0.00–0.07)
Basophils Absolute: 0 10*3/uL (ref 0.0–0.1)
Basophils Relative: 0 %
Eosinophils Absolute: 0 10*3/uL (ref 0.0–0.5)
Eosinophils Relative: 1 %
HCT: 38.6 % (ref 36.0–46.0)
Hemoglobin: 13.3 g/dL (ref 12.0–15.0)
Immature Granulocytes: 0 %
Lymphocytes Relative: 43 %
Lymphs Abs: 1.8 10*3/uL (ref 0.7–4.0)
MCH: 27.8 pg (ref 26.0–34.0)
MCHC: 34.5 g/dL (ref 30.0–36.0)
MCV: 80.8 fL (ref 80.0–100.0)
Monocytes Absolute: 0.3 10*3/uL (ref 0.1–1.0)
Monocytes Relative: 7 %
Neutro Abs: 2.1 10*3/uL (ref 1.7–7.7)
Neutrophils Relative %: 49 %
Platelets: 326 10*3/uL (ref 150–400)
RBC: 4.78 MIL/uL (ref 3.87–5.11)
RDW: 13.2 % (ref 11.5–15.5)
WBC: 4.2 10*3/uL (ref 4.0–10.5)
nRBC: 0 % (ref 0.0–0.2)

## 2019-03-07 LAB — URINALYSIS, ROUTINE W REFLEX MICROSCOPIC
Bacteria, UA: NONE SEEN
Bilirubin Urine: NEGATIVE
Glucose, UA: >=500 mg/dL — AB
Hgb urine dipstick: NEGATIVE
Ketones, ur: NEGATIVE mg/dL
Leukocytes,Ua: NEGATIVE
Nitrite: NEGATIVE
Protein, ur: 30 mg/dL — AB
Specific Gravity, Urine: 1.009 (ref 1.005–1.030)
pH: 6 (ref 5.0–8.0)

## 2019-03-07 LAB — CBG MONITORING, ED
Glucose-Capillary: 368 mg/dL — ABNORMAL HIGH (ref 70–99)
Glucose-Capillary: 297 mg/dL — ABNORMAL HIGH (ref 70–99)

## 2019-03-07 MED ORDER — METFORMIN HCL 500 MG PO TABS
500.0000 mg | ORAL_TABLET | Freq: Once | ORAL | Status: AC
  Administered 2019-03-07: 500 mg via ORAL
  Filled 2019-03-07: qty 1

## 2019-03-07 MED ORDER — LACTATED RINGERS IV BOLUS
1000.0000 mL | Freq: Once | INTRAVENOUS | Status: AC
  Administered 2019-03-07: 04:00:00 1000 mL via INTRAVENOUS

## 2019-03-07 MED ORDER — METFORMIN HCL 500 MG PO TABS: 500.0000 mg | ORAL_TABLET | Freq: Two times a day (BID) | ORAL | 0 refills | Status: DC

## 2019-03-07 MED ORDER — ONDANSETRON HCL 4 MG/2ML IJ SOLN
4.0000 mg | Freq: Once | INTRAMUSCULAR | Status: AC
  Administered 2019-03-07: 4 mg via INTRAVENOUS
  Filled 2019-03-07: qty 2

## 2019-03-07 NOTE — ED Provider Notes (Signed)
[facility] EMERGENCY DEPARTMENT Provider Note   CSN: [id number] Arrival date & time: 03/07/19  7622     History   Chief Complaint Chief Complaint  Patient presents with  . Hyperglycemia    HPI Kathryn Shelton is a 28 y.o. female.     The history is provided by the patient.  Hyperglycemia Blood sugar level PTA:  300+ Onset quality:  Gradual Duration:  3 days Timing:  Constant Progression:  Worsening Chronicity:  Recurrent Diabetes status:  Controlled with oral medications Time since last antidiabetic medication:  1 week Context: noncompliance   Relieved by:  None tried Ineffective treatments:  None tried Associated symptoms: increased thirst, nausea, polyuria and vomiting   Associated symptoms: no abdominal pain, no altered mental status, no blurred vision, no chest pain, no confusion, no fever and no increased appetite     Past Medical History:  Diagnosis Date  . DM (diabetes mellitus), type 2 ([place])   . Essential hypertension 03/01/2017  . GERD (gastroesophageal reflux disease)   . Hypertension   . Nausea and vomiting during pregnancy prior to [redacted] weeks gestation 01/14/2018  . Sleep apnea     Patient Active Problem List   Diagnosis Date Noted  . Oligohydramnios antepartum, second trimester, other fetus 03/09/2018  . Obesity, morbid ([place]) 03/09/2018  . Preterm premature rupture of membranes, unspecified as to length of time between rupture and onset of labor, second trimester 03/09/2018  . Supervision of high risk pregnancy, antepartum 12/31/2017  . Chronic hypertension during pregnancy, antepartum 12/31/2017  . Pre-existing type 2 diabetes mellitus during pregnancy, antepartum 12/31/2017  . Mixed hyperlipidemia 12/02/2017  . Type II diabetes mellitus, uncontrolled ([place]) 03/01/2017  . Essential hypertension 03/01/2017  . Intermittent palpitations 03/01/2017  . Panic attack 03/01/2017  . Obstructive sleep apnea syndrome 03/01/2017     History reviewed. No pertinent surgical history.   OB History    Gravida  3   Para  3   Term  2   Preterm  1   AB  0   Living  3     SAB  0   TAB  0   Ectopic  0   Multiple  0   Live Births  3            Home Medications    Prior to Admission medications   Medication Sig Start Date End Date Taking? Authorizing Provider  amLODipine (NORVASC) 5 MG tablet Take 1 tablet (5 mg total) by mouth daily. 01/11/19 02/10/19  Fatima Blank, MD  Blood Glucose Monitoring Suppl (ACCU-CHEK GUIDE) w/Device KIT 1 Device by Does not apply route 4 (four) times daily. Patient not taking: Reported on 05/27/2018 03/18/18   Osborne Oman, MD  glucose blood (ACCU-CHEK GUIDE) test strip Use to check blood sugars four times a day was instructed Patient not taking: Reported on 05/27/2018 03/18/18   Osborne Oman, MD  metFORMIN (GLUCOPHAGE) 500 MG tablet Take 1 tablet (500 mg total) by mouth 2 (two) times daily with a meal. 03/07/19   Geral Coker, Corene Cornea, MD    Family History Family History  Adopted: Yes  Family history unknown: Yes    Social History Social History   Tobacco Use  . Smoking status: Never Smoker  . Smokeless tobacco: Never Used  Substance Use Topics  . Alcohol use: No  . Drug use: No     Allergies   Shellfish-derived products   Review of Systems Review of Systems  Constitutional:  Negative for fever.  Eyes: Negative for blurred vision.  Cardiovascular: Negative for chest pain.  Gastrointestinal: Positive for nausea and vomiting. Negative for abdominal pain.  Endocrine: Positive for polydipsia and polyuria.  Psychiatric/Behavioral: Negative for confusion.  All other systems reviewed and are negative.    Physical Exam Updated Vital Signs BP (!) 162/117   Pulse 80   Resp 10   Ht 5' 6" (1.676 m)   Wt 99.8 kg   LMP 02/26/2019 (Exact Date)   SpO2 97%   BMI 35.51 kg/m   Physical Exam Vitals signs and nursing note reviewed.  Constitutional:       Appearance: She is well-developed.  HENT:     Head: Normocephalic and atraumatic.     Mouth/Throat:     Mouth: Mucous membranes are dry.     Pharynx: Oropharynx is clear.  Eyes:     Conjunctiva/sclera: Conjunctivae normal.  Neck:     Musculoskeletal: Normal range of motion.  Cardiovascular:     Rate and Rhythm: Normal rate and regular rhythm.  Pulmonary:     Effort: Pulmonary effort is normal. No respiratory distress.     Breath sounds: No stridor.  Abdominal:     General: There is no distension.     Palpations: Abdomen is soft.  Musculoskeletal: Normal range of motion.        General: No swelling.  Skin:    General: Skin is warm and dry.  Neurological:     General: No focal deficit present.     Mental Status: She is alert.      ED Treatments / Results  Labs (all labs ordered are listed, but only abnormal results are displayed) Labs Reviewed  URINALYSIS, ROUTINE W REFLEX MICROSCOPIC - Abnormal; Notable for the following components:      Result Value   Color, Urine STRAW (*)    Glucose, UA >=500 (*)    Protein, ur 30 (*)    All other components within normal limits  BASIC METABOLIC PANEL - Abnormal; Notable for the following components:   Sodium 133 (*)    Glucose, Bld 376 (*)    Calcium 8.5 (*)    All other components within normal limits  CBG MONITORING, ED - Abnormal; Notable for the following components:   Glucose-Capillary 368 (*)    All other components within normal limits  CBG MONITORING, ED - Abnormal; Notable for the following components:   Glucose-Capillary 297 (*)    All other components within normal limits  URINE CULTURE  CBC WITH DIFFERENTIAL/PLATELET    EKG None  Radiology No results found.  Procedures Procedures (including critical care time)  Medications Ordered in ED Medications  lactated ringers bolus 1,000 mL (0 mLs Intravenous Stopped 03/07/19 0440)  ondansetron (ZOFRAN) injection 4 mg (4 mg Intravenous Given 03/07/19 0340)   metFORMIN (GLUCOPHAGE) tablet 500 mg (500 mg Oral Given 03/07/19 8502)     Initial Impression / Assessment and Plan / ED Course  I have reviewed the triage vital signs and the nursing notes.  Pertinent labs & imaging results that were available during my care of the patient were reviewed by me and considered in my medical decision making (see chart for details).  Noncompliant diabetic here with hyperblycemia and s/s c/w same. Will ensure no DKA and improve CBG. Dc on meds and SW/CM consult for assistance in getting into a PCP.   No dka. Improved glucose. Metformin restarted. Plan for CM consult and ensure she gets a PCP appt.  Info for [facility].  Final Clinical Impressions(s) / ED Diagnoses   Final diagnoses:  Hyperglycemia    ED Discharge Orders         Ordered    metFORMIN (GLUCOPHAGE) 500 MG tablet  2 times daily with meals     03/07/19 0643           Mesner, Corene Cornea, MD 03/07/19 [phone]

## 2019-03-07 NOTE — ED Notes (Signed)
Patient verbalizes understanding of discharge instructions. Opportunity for questioning and answers were provided. Armband removed by staff, pt discharged from ED home via POV.  

## 2019-03-07 NOTE — ED Triage Notes (Signed)
Pt BIB [facility] for hyperglycemia. EMS reports pt told them she woke up with palpitations and sweaty. EMS reports initial sugar was 476. EMS reports history of diabetes but not controlled well. EMS reports IV in left AC and a fluid bolus of 437ml. EMS Reports pt CAOx4 with vital signs stable.   Pt reports hx of diabetes managed with metformin. Pt reports only receiving care from the emergency department and not going to her PCP because she couldn't afford to go. Pt reports unknown time of when her last dose of metformin was.

## 2019-03-07 NOTE — ED Notes (Signed)
Pt has htn hx and is also non-compliant with those medications.

## 2019-03-08 ENCOUNTER — Telehealth: Payer: Self-pay | Admitting: *Deleted

## 2019-03-08 LAB — URINE CULTURE: Culture: NO GROWTH

## 2019-03-08 NOTE — Telephone Encounter (Signed)
EDCM contacted pt to insure pt had picked up Rx.  Pt verbalized that she does have Rx in hand and will take as directed.  [facility] reminded pt of upcoming appointment at [facility]; patient agrees to keep appointment.

## 2019-03-30 ENCOUNTER — Other Ambulatory Visit: Payer: Self-pay | Admitting: Internal Medicine

## 2019-03-30 DIAGNOSIS — E049 Nontoxic goiter, unspecified: Secondary | ICD-10-CM

## 2019-03-31 ENCOUNTER — Ambulatory Visit: Payer: Medicaid Other | Admitting: Family Medicine

## 2019-03-31 ENCOUNTER — Ambulatory Visit: Payer: Medicaid Other | Admitting: Registered"

## 2019-03-31 ENCOUNTER — Other Ambulatory Visit: Payer: Self-pay

## 2019-04-05 ENCOUNTER — Ambulatory Visit
Admission: RE | Admit: 2019-04-05 | Discharge: 2019-04-05 | Disposition: A | Discharge status: Home / Self Care | Payer: Medicaid Other | Source: Ambulatory Visit | Attending: Internal Medicine | Admitting: Internal Medicine

## 2019-04-05 DIAGNOSIS — E049 Nontoxic goiter, unspecified: Secondary | ICD-10-CM

## 2019-05-13 ENCOUNTER — Emergency Department (HOSPITAL_COMMUNITY): Payer: Medicaid Other

## 2019-05-13 ENCOUNTER — Other Ambulatory Visit: Payer: Self-pay

## 2019-05-13 ENCOUNTER — Emergency Department (HOSPITAL_COMMUNITY)
Admission: EM | Admit: 2019-05-13 | Discharge: 2019-05-13 | Disposition: A | Discharge status: Home / Self Care | Payer: Medicaid Other | Attending: Emergency Medicine | Admitting: Emergency Medicine

## 2019-05-13 DIAGNOSIS — R0602 Shortness of breath: Secondary | ICD-10-CM | POA: Diagnosis present

## 2019-05-13 DIAGNOSIS — I10 Essential (primary) hypertension: Secondary | ICD-10-CM | POA: Diagnosis not present

## 2019-05-13 DIAGNOSIS — J189 Pneumonia, unspecified organism: Secondary | ICD-10-CM | POA: Insufficient documentation

## 2019-05-13 DIAGNOSIS — R05 Cough: Secondary | ICD-10-CM | POA: Insufficient documentation

## 2019-05-13 DIAGNOSIS — Z7984 Long term (current) use of oral hypoglycemic drugs: Secondary | ICD-10-CM | POA: Insufficient documentation

## 2019-05-13 DIAGNOSIS — R0789 Other chest pain: Secondary | ICD-10-CM | POA: Diagnosis not present

## 2019-05-13 DIAGNOSIS — E119 Type 2 diabetes mellitus without complications: Secondary | ICD-10-CM | POA: Insufficient documentation

## 2019-05-13 DIAGNOSIS — R110 Nausea: Secondary | ICD-10-CM | POA: Diagnosis not present

## 2019-05-13 DIAGNOSIS — R509 Fever, unspecified: Secondary | ICD-10-CM | POA: Diagnosis not present

## 2019-05-13 DIAGNOSIS — U071 COVID-19: Secondary | ICD-10-CM | POA: Diagnosis not present

## 2019-05-13 DIAGNOSIS — Z7189 Other specified counseling: Secondary | ICD-10-CM

## 2019-05-13 LAB — POC URINE PREG, ED: Preg Test, Ur: NEGATIVE

## 2019-05-13 MED ORDER — METHYLPREDNISOLONE SODIUM SUCC 125 MG IJ SOLR: 125.0000 mg | Freq: Once | INTRAMUSCULAR | Status: DC

## 2019-05-13 MED ORDER — PREDNISONE 20 MG PO TABS
60.0000 mg | ORAL_TABLET | Freq: Once | ORAL | Status: AC
  Administered 2019-05-13: 60 mg via ORAL
  Filled 2019-05-13: qty 3

## 2019-05-13 MED ORDER — AEROCHAMBER PLUS FLO-VU MEDIUM MISC
1.0000 | Freq: Once | Status: AC
  Administered 2019-05-13: 1
  Filled 2019-05-13: qty 1

## 2019-05-13 MED ORDER — PREDNISONE 10 MG PO TABS: 40.0000 mg | ORAL_TABLET | Freq: Every day | ORAL | 0 refills | Status: AC

## 2019-05-13 MED ORDER — ALBUTEROL SULFATE HFA 108 (90 BASE) MCG/ACT IN AERS
4.0000 | INHALATION_SPRAY | Freq: Once | RESPIRATORY_TRACT | Status: AC
  Administered 2019-05-13: 15:00:00 4 via RESPIRATORY_TRACT
  Filled 2019-05-13: qty 6.7

## 2019-05-13 MED ORDER — DOXYCYCLINE HYCLATE 100 MG PO CAPS: 100.0000 mg | ORAL_CAPSULE | Freq: Two times a day (BID) | ORAL | 0 refills | Status: AC

## 2019-05-13 NOTE — ED Notes (Signed)
Pt discharge instructions and prescriptions reviewed with the patient. The patient verbalized understanding of instructions. Pt discharged. 

## 2019-05-13 NOTE — ED Triage Notes (Signed)
Pt here for evaluation of weakness, coughing up mucous, shob, and fever x 1 week. Endorses cp with coughing.

## 2019-05-13 NOTE — ED Provider Notes (Signed)
[facility] EMERGENCY DEPARTMENT Provider Note   CSN: [id number] Arrival date & time: 05/13/19  1212     History Chief Complaint  Patient presents with  . Shortness of Breath  . Cough    Kathryn Shelton is a 28 y.o. female with a past medical history of diabetes, hypertension presenting to the ED with a chief complaint of cough and fever.  For the past 5 days been having cough productive with mucus, chest discomfort with coughing, fatigue, body aches, chills, decreased appetite.  Sick contacts including her daughter who have cold symptoms.  She has tried Robitussin, Alka-Seltzer, BC powders and Tylenol with minimal improvement in her symptoms.  She denies any known COVID-19 exposures.  Denies chest pain at rest, hemoptysis, history of asthma, recent travel, abdominal pain, leg swelling, history of DVT, PE or MI, recent immobilization.  She denies possibility of pregnancy.  HPI     Past Medical History:  Diagnosis Date  . DM (diabetes mellitus), type 2 ([place])   . Essential hypertension 03/01/2017  . GERD (gastroesophageal reflux disease)   . Hypertension   . Nausea and vomiting during pregnancy prior to [redacted] weeks gestation 01/14/2018  . Sleep apnea     Patient Active Problem List   Diagnosis Date Noted  . Oligohydramnios antepartum, second trimester, other fetus 03/09/2018  . Obesity, morbid ([place]) 03/09/2018  . Preterm premature rupture of membranes, unspecified as to length of time between rupture and onset of labor, second trimester 03/09/2018  . Supervision of high risk pregnancy, antepartum 12/31/2017  . Chronic hypertension during pregnancy, antepartum 12/31/2017  . Pre-existing type 2 diabetes mellitus during pregnancy, antepartum 12/31/2017  . Mixed hyperlipidemia 12/02/2017  . Type II diabetes mellitus, uncontrolled ([place]) 03/01/2017  . Essential hypertension 03/01/2017  . Intermittent palpitations 03/01/2017  . Panic attack 03/01/2017  . Obstructive  sleep apnea syndrome 03/01/2017    No past surgical history on file.   OB History    Gravida  3   Para  3   Term  2   Preterm  1   AB  0   Living  3     SAB  0   TAB  0   Ectopic  0   Multiple  0   Live Births  3           Family History  Adopted: Yes  Family history unknown: Yes    Social History   Tobacco Use  . Smoking status: Never Smoker  . Smokeless tobacco: Never Used  Substance Use Topics  . Alcohol use: No  . Drug use: No    Home Medications Prior to Admission medications   Medication Sig Start Date End Date Taking? Authorizing Provider  amLODipine (NORVASC) 5 MG tablet Take 1 tablet (5 mg total) by mouth daily. 01/11/19 02/10/19  Fatima Blank, MD  Blood Glucose Monitoring Suppl (ACCU-CHEK GUIDE) w/Device KIT 1 Device by Does not apply route 4 (four) times daily. Patient not taking: Reported on 05/27/2018 03/18/18   Osborne Oman, MD  doxycycline (VIBRAMYCIN) 100 MG capsule Take 1 capsule (100 mg total) by mouth 2 (two) times daily for 7 days. 05/13/19 05/20/19  Dalani Mette, PA-C  glucose blood (ACCU-CHEK GUIDE) test strip Use to check blood sugars four times a day was instructed Patient not taking: Reported on 05/27/2018 03/18/18   Anyanwu, Sallyanne Havers, MD  metFORMIN (GLUCOPHAGE) 500 MG tablet Take 1 tablet (500 mg total) by mouth 2 (two) times daily  with a meal. 03/07/19   Mesner, Corene Cornea, MD  predniSONE (DELTASONE) 10 MG tablet Take 4 tablets (40 mg total) by mouth daily for 3 days. 05/13/19 05/16/19  Philbert Ocallaghan, Nicanor Alcon, PA-C    Allergies    Shellfish-derived products  Review of Systems   Review of Systems  Constitutional: Positive for appetite change and chills. Negative for fever.  HENT: Negative for ear pain, rhinorrhea, sneezing and sore throat.   Eyes: Negative for photophobia and visual disturbance.  Respiratory: Positive for cough, chest tightness and shortness of breath. Negative for wheezing.   Cardiovascular: Negative for  chest pain and palpitations.  Gastrointestinal: Positive for nausea. Negative for abdominal pain, blood in stool, constipation, diarrhea and vomiting.  Genitourinary: Negative for dysuria, hematuria and urgency.  Musculoskeletal: Positive for myalgias.  Skin: Negative for rash.  Neurological: Negative for dizziness, weakness and light-headedness.    Physical Exam Updated Vital Signs BP (!) 172/127 (BP Location: Left Arm)   Pulse 96   Temp 98.6 F (37 C) (Oral)   Resp 16   SpO2 99%   Physical Exam Vitals and nursing note reviewed.  Constitutional:      General: She is not in acute distress.    Appearance: She is well-developed.     Comments: Cough noted on my examination.  Speaking in complete sentences.  HENT:     Head: Normocephalic and atraumatic.     Nose: Nose normal.  Eyes:     General: No scleral icterus.       Left eye: No discharge.     Conjunctiva/sclera: Conjunctivae normal.  Cardiovascular:     Rate and Rhythm: Normal rate and regular rhythm.     Heart sounds: Normal heart sounds. No murmur. No friction rub. No gallop.   Pulmonary:     Effort: Pulmonary effort is normal. No respiratory distress.     Breath sounds: Normal breath sounds.     Comments: Wheezing noted at the base of bilateral lungs. Abdominal:     General: Bowel sounds are normal. There is no distension.     Palpations: Abdomen is soft.     Tenderness: There is no abdominal tenderness. There is no guarding.  Musculoskeletal:        General: Normal range of motion.     Cervical back: Normal range of motion and neck supple.  Skin:    General: Skin is warm and dry.     Findings: No rash.  Neurological:     Mental Status: She is alert.     Motor: No abnormal muscle tone.     Coordination: Coordination normal.     ED Results / Procedures / Treatments   Labs (all labs ordered are listed, but only abnormal results are displayed) Labs Reviewed  NOVEL CORONAVIRUS, NAA (HOSP ORDER, SEND-OUT TO  REF LAB; TAT 18-24 HRS)  POC URINE PREG, ED    EKG None  Radiology DG Chest Portable 1 View  Result Date: 05/13/2019 CLINICAL DATA:  Weakness, coughing up mucus, shortness of breath and fever for 1 week, chest pain with coughing, history type II diabetes mellitus, GERD, hypertension EXAM: PORTABLE CHEST 1 VIEW COMPARISON:  Portable exam 1324 hours compared 01/10/2019 FINDINGS: Normal heart size, mediastinal contours, and pulmonary vascularity for technique. Minimal opacity at the RIGHT lateral costophrenic angle question subtle infiltrate versus atelectasis. Remaining lungs clear. No pleural effusion or pneumothorax. IMPRESSION: Minimal atelectasis versus infiltrate at lateral RIGHT lung base. Electronically Signed   By: Crist Infante.D.  On: 05/13/2019 14:05    Procedures Procedures (including critical care time)  Medications Ordered in ED Medications  albuterol (VENTOLIN HFA) 108 (90 Base) MCG/ACT inhaler 4 puff (4 puffs Inhalation Given 05/13/19 1436)  predniSONE (DELTASONE) tablet 60 mg (60 mg Oral Given 05/13/19 1436)  AeroChamber Plus Flo-Vu Medium MISC 1 each (1 each Other Given 05/13/19 1436)    ED Course  I have reviewed the triage vital signs and the nursing notes.  Pertinent labs & imaging results that were available during my care of the patient were reviewed by me and considered in my medical decision making (see chart for details).    MDM Rules/Calculators/A&P                      Donnabelle Blanchard was evaluated in Emergency Department on 05/13/19  for the symptoms described in the history of present illness. He/she was evaluated in the context of the global COVID-19 pandemic, which necessitated consideration that the patient might be at risk for infection with the SARS-CoV-2 virus that causes COVID-19. Institutional protocols and algorithms that pertain to the evaluation of patients at risk for COVID-19 are in a state of rapid change based on information released by  regulatory bodies including the CDC and federal and state organizations. These policies and algorithms were followed during the patient's care in the ED.  28 year old female with past medical history of diabetes, hypertension presents to the ED with a chief complaint of cough and fever.  Reports 5-day history of cough productive with mucus, chest discomfort with coughing, fatigue, body aches, chills and decreased appetite.  Minimal improvement with over-the-counter medications.  On my exam there are some lower lobe wheezing noted as well as chest tightness.  EKG shows sinus tachycardia without ischemic changes.  Chest x-ray shows possible right lower lobe pneumonia.  Will obtain Covid test, give prednisone, antibiotics and reassess.  Patient with improvement in her symptoms with medications given here.  Will treat with prednisone, doxycycline, as needed albuterol and PCP follow-up.    Patient is hemodynamically stable, in NAD, and able to ambulate in the ED. Evaluation does not show pathology that would require ongoing emergent intervention or inpatient treatment. I explained the diagnosis to the patient. Pain has been managed and has no complaints prior to discharge. Patient is comfortable with above plan and is stable for discharge at this time. All questions were answered prior to disposition. Strict return precautions for returning to the ED were discussed. Encouraged follow up with PCP.   An After Visit Summary was printed and given to the patient.   Portions of this note were generated with [employer]. Dictation errors may occur despite best attempts at proofreading.  Final Clinical Impression(s) / ED Diagnoses Final diagnoses:  Community acquired pneumonia of right lower lobe of lung  Educated about COVID-19 virus infection    Rx / DC Orders ED Discharge Orders         Ordered    predniSONE (DELTASONE) 10 MG tablet  Daily     05/13/19 1536    doxycycline (VIBRAMYCIN)  100 MG capsule  2 times daily     05/13/19 1536           Delia Heady, PA-C 05/13/19 1537    Sherwood Gambler, MD 05/13/19 1540

## 2019-05-16 ENCOUNTER — Telehealth: Payer: Self-pay | Admitting: Infectious Diseases

## 2019-05-16 ENCOUNTER — Other Ambulatory Visit: Payer: Self-pay | Admitting: Infectious Diseases

## 2019-05-16 DIAGNOSIS — U071 COVID-19: Secondary | ICD-10-CM

## 2019-05-16 DIAGNOSIS — E1165 Type 2 diabetes mellitus with hyperglycemia: Secondary | ICD-10-CM

## 2019-05-16 DIAGNOSIS — E1065 Type 1 diabetes mellitus with hyperglycemia: Secondary | ICD-10-CM

## 2019-05-16 LAB — NOVEL CORONAVIRUS, NAA (HOSP ORDER, SEND-OUT TO REF LAB; TAT 18-24 HRS): SARS-CoV-2, NAA: DETECTED — AB

## 2019-05-16 NOTE — Progress Notes (Signed)
See telephone note from 05/16/19

## 2019-05-16 NOTE — Telephone Encounter (Signed)
I connected by phone with Kathryn Shelton on 05/16/2019 at 12:58 PM to discuss the potential use of an new treatment for mild to moderate COVID-19 viral infection in non-hospitalized patients.  This patient is a 28 y.o. female that meets the FDA criteria for Emergency Use Authorization of bamlanivimab or casirivimab\imdevimab.  Has a (+) direct SARS-CoV-2 viral test result  Has mild or moderate COVID-19   Is ? 30 years of age and weighs ? 40 kg  Is NOT hospitalized due to COVID-19  Is NOT requiring oxygen therapy or requiring an increase in baseline oxygen flow rate due to COVID-19  Is within 10 days of symptom onset  Has at least one of the high risk factor(s) for progression to severe COVID-19 and/or hospitalization as defined in EUA.  Specific high risk criteria : Diabetes.   I have spoken and communicated the following to the patient or parent/caregiver:  1. FDA has authorized the emergency use of bamlanivimab and casirivimab\imdevimab for the treatment of mild to moderate COVID-19 in adults and pediatric patients with positive results of direct SARS-CoV-2 viral testing who are 71 years of age and older weighing at least 40 kg, and who are at high risk for progressing to severe COVID-19 and/or hospitalization.  2. The significant known and potential risks and benefits of bamlanivimab and casirivimab\imdevimab, and the extent to which such potential risks and benefits are unknown.  3. Information on available alternative treatments and the risks and benefits of those alternatives, including clinical trials.  4. Patients treated with bamlanivimab and casirivimab\imdevimab should continue to self-isolate and use infection control measures (e.g., wear mask, isolate, social distance, avoid sharing personal items, clean and disinfect "high touch" surfaces, and frequent handwashing) according to CDC guidelines.   5. The patient or parent/caregiver has the option to accept or refuse  bamlanivimab or casirivimab\imdevimab .  After reviewing this information with the patient, The patient agreed to proceed with receiving the bamlanimivab infusion and will be provided a copy of the Fact sheet prior to receiving the infusion.   She has an 61 month old in the home with her and no other housemates. No one has come to visit since symptoms started on 12/15. She still has nasal congestion and cough. She will arrange for the baby's god mother to watch her while she gets infusion - her god mother is reported to be healthy and < 57 yo. I asked her to please have the child's caretaker wear a mask during their visit as we have to presume that her baby has also been infected. She understands and will.   Kathryn Shelton 05/16/2019 12:58 PM

## 2019-05-18 ENCOUNTER — Ambulatory Visit (HOSPITAL_COMMUNITY)
Admission: RE | Admit: 2019-05-18 | Discharge: 2019-05-18 | Disposition: A | Discharge status: Home / Self Care | Payer: Medicaid Other | Source: Ambulatory Visit | Attending: Pulmonary Disease | Admitting: Pulmonary Disease

## 2019-05-18 DIAGNOSIS — U071 COVID-19: Secondary | ICD-10-CM

## 2019-05-18 DIAGNOSIS — E1165 Type 2 diabetes mellitus with hyperglycemia: Secondary | ICD-10-CM | POA: Insufficient documentation

## 2019-05-18 MED ORDER — SODIUM CHLORIDE 0.9 % IV SOLN
Freq: Once | INTRAVENOUS | Status: AC
  Filled 2019-05-18: qty 10

## 2019-05-18 MED ORDER — EPINEPHRINE 0.3 MG/0.3ML IJ SOAJ: 0.3000 mg | Freq: Once | INTRAMUSCULAR | Status: DC | PRN

## 2019-05-18 MED ORDER — FAMOTIDINE IN NACL 20-0.9 MG/50ML-% IV SOLN: 20.0000 mg | Freq: Once | INTRAVENOUS | Status: DC | PRN

## 2019-05-18 MED ORDER — DIPHENHYDRAMINE HCL 50 MG/ML IJ SOLN: 50.0000 mg | Freq: Once | INTRAMUSCULAR | Status: DC | PRN

## 2019-05-18 MED ORDER — SODIUM CHLORIDE 0.9 % IV SOLN
INTRAVENOUS | Status: DC | PRN
  Administered 2019-05-18: 250 mL via INTRAVENOUS

## 2019-05-18 MED ORDER — ALBUTEROL SULFATE HFA 108 (90 BASE) MCG/ACT IN AERS: 2.0000 | INHALATION_SPRAY | Freq: Once | RESPIRATORY_TRACT | Status: DC | PRN

## 2019-05-18 MED ORDER — METHYLPREDNISOLONE SODIUM SUCC 125 MG IJ SOLR: 125.0000 mg | Freq: Once | INTRAMUSCULAR | Status: DC | PRN

## 2019-05-18 NOTE — Discharge Instructions (Signed)
COVID-19: How to Protect Yourself and Others Know how it spreads  There is currently no vaccine to prevent coronavirus disease 2019 (COVID-19).  The best way to prevent illness is to avoid being exposed to this virus.  The virus is thought to spread mainly from person-to-person. ? Between people who are in close contact with one another (within about 6 feet). ? Through respiratory droplets produced when an infected person coughs, sneezes or talks. ? These droplets can land in the mouths or noses of people who are nearby or possibly be inhaled into the lungs. ? Some recent studies have suggested that COVID-19 may be spread by people who are not showing symptoms. Everyone should Clean your hands often  Wash your hands often with soap and water for at least 20 seconds especially after you have been in a public place, or after blowing your nose, coughing, or sneezing.  If soap and water are not readily available, use a hand sanitizer that contains at least 60% alcohol. Cover all surfaces of your hands and rub them together until they feel dry.  Avoid touching your eyes, nose, and mouth with unwashed hands. Avoid close contact  Stay home if you are sick.  Avoid close contact with people who are sick.  Put distance between yourself and other people. ? Remember that some people without symptoms may be able to spread virus. ? This is especially important for people who are at higher risk of getting very sick.www.cdc.gov/coronavirus/2019-ncov/need-extra-precautions/people-at-higher-risk.html Cover your mouth and nose with a cloth face cover when around others  You could spread COVID-19 to others even if you do not feel sick.  Everyone should wear a cloth face cover when they have to go out in public, for example to the grocery store or to pick up other necessities. ? Cloth face coverings should not be placed on young children under age 2, anyone who has trouble breathing, or is unconscious,  incapacitated or otherwise unable to remove the mask without assistance.  The cloth face cover is meant to protect other people in case you are infected.  Do NOT use a facemask meant for a healthcare worker.  Continue to keep about 6 feet between yourself and others. The cloth face cover is not a substitute for social distancing. Cover coughs and sneezes  If you are in a private setting and do not have on your cloth face covering, remember to always cover your mouth and nose with a tissue when you cough or sneeze or use the inside of your elbow.  Throw used tissues in the trash.  Immediately wash your hands with soap and water for at least 20 seconds. If soap and water are not readily available, clean your hands with a hand sanitizer that contains at least 60% alcohol. Clean and disinfect  Clean AND disinfect frequently touched surfaces daily. This includes tables, doorknobs, light switches, countertops, handles, desks, phones, keyboards, toilets, faucets, and sinks. www.cdc.gov/coronavirus/2019-ncov/prevent-getting-sick/disinfecting-your-home.html  If surfaces are dirty, clean them: Use detergent or soap and water prior to disinfection.  Then, use a household disinfectant. You can see a list of EPA-registered household disinfectants here. cdc.gov/coronavirus 09/28/2018 This information is not intended to replace advice given to you by your health care provider. Make sure you discuss any questions you have with your health care provider. Document Released: 09/07/2018 Document Revised: 10/06/2018 Document Reviewed: 09/07/2018 Elsevier Patient Education  2020 Elsevier Inc.  

## 2019-05-18 NOTE — Progress Notes (Signed)
  Diagnosis: COVID-19  Physician: Audley Hose  Procedure: [facility] Infusion [facility] Med: casirivimab\imdevimab infusion - Provided patient with casirivimab\imdevimab fact sheet for patients, parents and caregivers prior to infusion.  Complications: No immediate complications noted.  Discharge: Discharged home   Gaye Alken 05/18/2019

## 2019-05-25 ENCOUNTER — Other Ambulatory Visit: Payer: Self-pay

## 2019-05-25 ENCOUNTER — Emergency Department (HOSPITAL_COMMUNITY)
Admission: EM | Admit: 2019-05-25 | Discharge: 2019-05-25 | Disposition: A | Discharge status: Home / Self Care | Payer: Medicaid Other | Attending: Emergency Medicine | Admitting: Emergency Medicine

## 2019-05-25 ENCOUNTER — Emergency Department (HOSPITAL_COMMUNITY): Payer: Medicaid Other

## 2019-05-25 ENCOUNTER — Encounter (HOSPITAL_COMMUNITY): Payer: Self-pay | Admitting: Emergency Medicine

## 2019-05-25 DIAGNOSIS — Z7984 Long term (current) use of oral hypoglycemic drugs: Secondary | ICD-10-CM | POA: Insufficient documentation

## 2019-05-25 DIAGNOSIS — E1165 Type 2 diabetes mellitus with hyperglycemia: Secondary | ICD-10-CM | POA: Insufficient documentation

## 2019-05-25 DIAGNOSIS — I10 Essential (primary) hypertension: Secondary | ICD-10-CM | POA: Insufficient documentation

## 2019-05-25 DIAGNOSIS — R739 Hyperglycemia, unspecified: Secondary | ICD-10-CM

## 2019-05-25 DIAGNOSIS — R0602 Shortness of breath: Secondary | ICD-10-CM | POA: Diagnosis present

## 2019-05-25 LAB — CBC
HCT: 38.3 % (ref 36.0–46.0)
Hemoglobin: 12.3 g/dL (ref 12.0–15.0)
MCH: 26.5 pg (ref 26.0–34.0)
MCHC: 32.1 g/dL (ref 30.0–36.0)
MCV: 82.5 fL (ref 80.0–100.0)
Platelets: 315 K/uL (ref 150–400)
RBC: 4.64 MIL/uL (ref 3.87–5.11)
RDW: 14.1 % (ref 11.5–15.5)
WBC: 4.8 K/uL (ref 4.0–10.5)
nRBC: 0 % (ref 0.0–0.2)

## 2019-05-25 LAB — BASIC METABOLIC PANEL
Anion gap: 12 (ref 5–15)
BUN: 15 mg/dL (ref 6–20)
CO2: 20 mmol/L — ABNORMAL LOW (ref 22–32)
Calcium: 8.4 mg/dL — ABNORMAL LOW (ref 8.9–10.3)
Chloride: 99 mmol/L (ref 98–111)
Creatinine, Ser: 1.02 mg/dL — ABNORMAL HIGH (ref 0.44–1.00)
GFR calc Af Amer: >60 mL/min (ref >60)
GFR calc non Af Amer: >60 mL/min (ref >60)
Glucose, Bld: 549 mg/dL (ref 70–99)
Potassium: 4.3 mmol/L (ref 3.5–5.1)
Sodium: 131 mmol/L — ABNORMAL LOW (ref 135–145)

## 2019-05-25 LAB — CBG MONITORING, ED: Glucose-Capillary: 310 mg/dL — ABNORMAL HIGH (ref 70–99)

## 2019-05-25 LAB — URINALYSIS, ROUTINE W REFLEX MICROSCOPIC
Bilirubin Urine: NEGATIVE
Glucose, UA: >=500 mg/dL — AB
Ketones, ur: NEGATIVE mg/dL
Leukocytes,Ua: NEGATIVE
Nitrite: NEGATIVE
Protein, ur: 100 mg/dL — AB
RBC / HPF: >50 RBC/hpf — ABNORMAL HIGH (ref 0–5)
Specific Gravity, Urine: 1.022 (ref 1.005–1.030)
pH: 5 (ref 5.0–8.0)

## 2019-05-25 LAB — I-STAT BETA HCG BLOOD, ED (MC, WL, AP ONLY): I-stat hCG, quantitative: <5 m[IU]/mL

## 2019-05-25 LAB — TROPONIN I (HIGH SENSITIVITY): Troponin I (High Sensitivity): 5 ng/L (ref <18)

## 2019-05-25 LAB — PREGNANCY, URINE: Preg Test, Ur: NEGATIVE

## 2019-05-25 MED ORDER — INSULIN ASPART 100 UNIT/ML ~~LOC~~ SOLN
10.0000 [IU] | Freq: Once | SUBCUTANEOUS | Status: AC
  Administered 2019-05-25: 10 [IU] via SUBCUTANEOUS

## 2019-05-25 MED ORDER — SODIUM CHLORIDE 0.9 % IV BOLUS: 1000.0000 mL | Freq: Once | INTRAVENOUS | Status: DC

## 2019-05-25 NOTE — Discharge Instructions (Signed)
Please increase your Metformin to 2 tablets in the morning and 2 tablets in the evening.  Please follow-up with primary care doctor for reevaluation of your blood sugar.  Blood sugar was 550 in the ED today.  Please continue to hydrate. Return to ED if you have any new or concerning symptoms.  Please check your blood sugar and recorded regular levels at least 3 times a day with meals.

## 2019-05-25 NOTE — ED Notes (Signed)
Pt ambulated to and from restroom. 

## 2019-05-25 NOTE — ED Notes (Signed)
Ambulated pt on pulse ox, spo2 dropped to 95 

## 2019-05-25 NOTE — ED Notes (Signed)
[place] PA at bedside 

## 2019-05-25 NOTE — ED Notes (Signed)
IV attempted without success. 

## 2019-05-25 NOTE — ED Provider Notes (Addendum)
[place] EMERGENCY DEPARTMENT Provider Note   CSN: [id number] Arrival date & time: 05/25/19  1456     History Chief Complaint  Patient presents with  . Shortness of Breath    Kathryn Shelton is a 28 y.o. female.  HPI  Patient is a 28 year old female with a history of type 2 diabetes on Metformin and hypertension.  Patient states that her primary complaint was shortness of breath associated with her feeling anxious because her daughter is in the hospital currently.  Patient recently diagnosed with Covid 2 weeks ago.  She had 1 infusion treatment as an outpatient.  States she feels improved has had some mild shortness of breath since she was diagnosed with Covid.  States that last night she had some heart palpitations and felt like she had some chest discomfort as well.  Denies any chest pain, nausea vomiting or diaphoresis.  States she is eating and drinking well.  Patient states she is taking her Metformin as prescribed twice a day.  States that she is on no other antihyperglycemics.  Patient states that she is stressed and upset as her daughter who is 60 years old and is currently admitted to the [facility] for treatment of her COVID-19.  Patient states that her blood sugars usually run in the 300 range.  Was not aware that her blood sugar was over 500 today.       Past Medical History:  Diagnosis Date  . DM (diabetes mellitus), type 2 ([place])   . Essential hypertension 03/01/2017  . GERD (gastroesophageal reflux disease)   . Hypertension   . Nausea and vomiting during pregnancy prior to [redacted] weeks gestation 01/14/2018  . Sleep apnea     Patient Active Problem List   Diagnosis Date Noted  . Oligohydramnios antepartum, second trimester, other fetus 03/09/2018  . Obesity, morbid ([place]) 03/09/2018  . Preterm premature rupture of membranes, unspecified as to length of time between rupture and onset of labor, second trimester 03/09/2018  . Supervision of  high risk pregnancy, antepartum 12/31/2017  . Chronic hypertension during pregnancy, antepartum 12/31/2017  . Pre-existing type 2 diabetes mellitus during pregnancy, antepartum 12/31/2017  . Mixed hyperlipidemia 12/02/2017  . Type II diabetes mellitus, uncontrolled ([place]) 03/01/2017  . Essential hypertension 03/01/2017  . Intermittent palpitations 03/01/2017  . Panic attack 03/01/2017  . Obstructive sleep apnea syndrome 03/01/2017    History reviewed. No pertinent surgical history.   OB History    Gravida  3   Para  3   Term  2   Preterm  1   AB  0   Living  3     SAB  0   TAB  0   Ectopic  0   Multiple  0   Live Births  3           Family History  Adopted: Yes  Family history unknown: Yes    Social History   Tobacco Use  . Smoking status: Never Smoker  . Smokeless tobacco: Never Used  Substance Use Topics  . Alcohol use: No  . Drug use: No    Home Medications Prior to Admission medications   Medication Sig Start Date End Date Taking? Authorizing Provider  amLODipine (NORVASC) 5 MG tablet Take 1 tablet (5 mg total) by mouth daily. 01/11/19 02/10/19  Fatima Blank, MD  Blood Glucose Monitoring Suppl (ACCU-CHEK GUIDE) w/Device KIT 1 Device by Does not apply route 4 (four) times daily. Patient not taking:  Reported on 05/27/2018 03/18/18   Osborne Oman, MD  glucose blood (ACCU-CHEK GUIDE) test strip Use to check blood sugars four times a day was instructed Patient not taking: Reported on 05/27/2018 03/18/18   Anyanwu, Sallyanne Havers, MD  metFORMIN (GLUCOPHAGE) 500 MG tablet Take 1 tablet (500 mg total) by mouth 2 (two) times daily with a meal. 03/07/19   Mesner, Corene Cornea, MD    Allergies    Shellfish-derived products  Review of Systems   Review of Systems  Constitutional: Positive for fatigue. Negative for chills and fever.  HENT: Negative for congestion.   Eyes: Negative for pain.  Respiratory: Positive for chest tightness and shortness of  breath. Negative for cough.   Cardiovascular: Negative for chest pain and leg swelling.  Gastrointestinal: Negative for abdominal pain and vomiting.  Genitourinary: Negative for dysuria.  Musculoskeletal: Negative for myalgias.  Skin: Negative for rash.  Neurological: Negative for dizziness and headaches.  Psychiatric/Behavioral: The patient is nervous/anxious.     Physical Exam Updated Vital Signs BP (!) 155/104   Pulse 92   Temp 98.2 F (36.8 C) (Oral)   Resp 18   SpO2 98%   Physical Exam Vitals and nursing note reviewed.  Constitutional:      General: She is not in acute distress.    Appearance: She is obese.  HENT:     Head: Normocephalic and atraumatic.     Nose: Nose normal.     Mouth/Throat:     Mouth: Mucous membranes are moist.  Eyes:     General: No scleral icterus. Cardiovascular:     Rate and Rhythm: Normal rate and regular rhythm.     Pulses: Normal pulses.     Heart sounds: Normal heart sounds.  Pulmonary:     Effort: Pulmonary effort is normal. No respiratory distress.     Breath sounds: No wheezing.     Comments: No increased work of breathing, breathing approximately 16-18 respirations per minute, pulse oximetry is 98% on room air.  She is speaking in full sentences without accessory muscle usage Abdominal:     Palpations: Abdomen is soft.     Tenderness: There is no abdominal tenderness.  Musculoskeletal:     Cervical back: Normal range of motion.     Right lower leg: No edema.     Left lower leg: No edema.  Skin:    General: Skin is warm and dry.     Capillary Refill: Capillary refill takes less than 2 seconds.  Neurological:     Mental Status: She is alert. Mental status is at baseline.  Psychiatric:        Mood and Affect: Mood normal.        Behavior: Behavior normal.     ED Results / Procedures / Treatments   Labs (all labs ordered are listed, but only abnormal results are displayed) Labs Reviewed  BASIC METABOLIC PANEL - Abnormal;  Notable for the following components:      Result Value   Sodium 131 (*)    CO2 20 (*)    Glucose, Bld 549 (*)    Creatinine, Ser 1.02 (*)    Calcium 8.4 (*)    All other components within normal limits  URINALYSIS, ROUTINE W REFLEX MICROSCOPIC - Abnormal; Notable for the following components:   Glucose, UA >=500 (*)    Hgb urine dipstick LARGE (*)    Protein, ur 100 (*)    RBC / HPF >50 (*)    Bacteria, UA  RARE (*)    All other components within normal limits  CBG MONITORING, ED - Abnormal; Notable for the following components:   Glucose-Capillary 310 (*)    All other components within normal limits  CBC  PREGNANCY, URINE  I-STAT BETA HCG BLOOD, ED ([facility], [facility], [facility] ONLY)  TROPONIN I (HIGH SENSITIVITY)  TROPONIN I (HIGH SENSITIVITY)    EKG EKG Interpretation  Date/Time:  Wednesday May 25 2019 15:06:03 EST Ventricular Rate:  91 PR Interval:  168 QRS Duration: 74 QT Interval:  374 QTC Calculation: 460 R Axis:   49 Text Interpretation: Normal sinus rhythm Normal ECG decreased rate since 05/13/19, otherwise unchanged EKG Confirmed by Quintella Reichert [phone]) on 05/25/2019 7:38:09 PM   Radiology DG Chest Port 1 View  Result Date: 05/25/2019 CLINICAL DATA:  COVID. Shortness of breath. EXAM: PORTABLE CHEST 1 VIEW COMPARISON:  05/13/2019 FINDINGS: Borderline cardiomegaly. Linear subsegmental atelectasis or scarring at the right lung base. The lungs appear otherwise clear. No pleural effusion. Otherwise normal exam. IMPRESSION: 1. Borderline enlargement of the cardiopericardial silhouette. 2. Linear scarring or subsegmental atelectasis at the right lung base, improved from prior. Electronically Signed   By: Van Clines M.D.   On: 05/25/2019 18:59    Procedures Procedures (including critical care time)  Medications Ordered in ED Medications  sodium chloride 0.9 % bolus 1,000 mL (has no administration in time range)  insulin aspart (novoLOG) injection 10 Units (10 Units  Subcutaneous Given 05/25/19 2034)    ED Course  I have reviewed the triage vital signs and the nursing notes.  Pertinent labs & imaging results that were available during my care of the patient were reviewed by me and considered in my medical decision making (see chart for details).  Clinical Course as of May 24 2314  Wed May 25, 2019  2034 Urinalysis without evidence of infection patient is currently on her period and states the blood is likely from this.  She has no abdominal pain or urinary complaints.  Urinalysis, Routine w reflex microscopic(!) [WF]  2147 Patient has elevated glucose without anion gap.  Doubt DKA as she has no symptoms of such and has no anion gap elevation.  Furthermore patient is type II diabetic likely this is chronic for diabetic control.  Will provide patient with 10 units of insulin and recheck CBG.  Basic metabolic panel(!!) [WF]  6222 Mild hyponatremia which does not appear to be causing any symptoms with patient.  Basic metabolic panel(!!) [WF]  9798 Chronically elevated stable creatinine  Creatinine(!): 1.02 [WF]  2148 Normal limits for troponin.  Patient has been having chest tightness since last night.  She is not having any chest pain.  Doubt ACS.  Using chest pain algorithm patient meets criteria for discharge after single troponin.  Troponin I (High Sensitivity) [WF]  2149 Chest x-ray with slightly enlarged pericardial silhouette.  Doubt pericarditis or effusion as patient has no hypotension and no evidence of pericarditis on EKG.  She does not have any chest pain.  We will discussed these results with patient and give her strict return precautions.  DG Chest Port 1 View [WF]  2154 CBG improved to 310 which is approximately patient's baseline  POC CBG, ED(!) [WF]    Clinical Course User Index [WF] Tedd Sias, [place]   MDM Rules/Calculators/A&P                      Patient presents today with shortness of breath and some chest tightness  that has  been ongoing since last night.  She has a history of anxiety and states that her daughter is currently admitted in the hospital for Covid.  Patient recently was diagnosed with Covid as well.  States that she is feeling much improved however states that her blood sugars usually run in the 200-300 range has been elevated recently.  Patient states she takes her Metformin 500 mg twice daily as prescribed.  She has not been prescribed any medications for her diabetes.  Patient describes the chest discomfort as achy.  She has noticed occasional cough.  Lab work interpreted above.  Notable for hyperglycemia 550.  All other lab abnormalities on at baseline per patient  Troponin within normal limits.  EKG with no evidence of pericarditis.  There is slightly enlarged cardiac silhouette on chest x-ray there is no pulmonary edema to indicate CHF.  Doubt pericarditis with patient does not have any chest pain particularly no positional chest pain.  However I discussed my concerns with her with pericarditis and gave her strict return precautions.  Nursing unable to obtain IV access to administer 1 L normal saline.  Unlikely (necessary at this time as patient's CBG corrected to approximately 300 with 10 units of insulin SQ.   While would normally recommend ibuprofen patient has mildly elevated creatinine.  Recommended Tylenol and hydration and some ibuprofen as tolerated.  Doubt myopericarditis as patient has negative troponin.  Symptoms of been ongoing for 24 hours at this point and troponin is within normal limits.  She has a low heart score and by [facility] chest pain algorithm she is appropriate to consider discharge with.  I discussed with patient she would like to be discharged and follow-up with her daughter who is currently admitted to the hospital.  Chest discomfort resolved at this time.  Patient no longer  complaining of shortness of breath.  Patient discharged with vitals grossly within normal department mildly  elevated blood pressure.   This patient appears reasonably screened and I doubt any other medical condition requiring further workup, evaluation, or treatment in the ED at this time prior to discharge.   Patient's vitals are WNL apart from vital sign abnormalities discussed above, patient is in NAD, and able to ambulate in the ED at their baseline. Pain has been managed or a plan has been made for home management and has no complaints prior to discharge. Patient is comfortable with above plan and is stable for discharge at this time. All questions were answered prior to disposition. Results from the ER workup discussed with the patient face to face and all questions answered to the best of my ability. The patient is safe for discharge with strict return precautions. Patient appears safe for discharge with appropriate follow-up. Conveyed my impression with the patient and they voiced understanding and are agreeable to plan.   An After Visit Summary was printed and given to the patient.  Portions of this note were generated with [employer]. Dictation errors may occur despite best attempts at proofreading.      Final Clinical Impression(s) / ED Diagnoses Final diagnoses:  Hyperglycemia    Rx / DC Orders ED Discharge Orders    None       Tedd Sias, [place] 05/25/19 2316    Tedd Sias, [place] 05/25/19 2320    Quintella Reichert, MD 05/26/19 [phone]

## 2019-05-25 NOTE — ED Notes (Addendum)
2nd trop not needed per PA

## 2019-05-25 NOTE — ED Triage Notes (Signed)
Pt reports SOB. Endorses some chest discomfort.

## 2019-06-28 IMAGING — US US OB TRANSVAGINAL
1 series · 14 of 28 positions shown · non-contrast
Comparison: None.

CLINICAL DATA: Abdominal pain

EXAM:
OBSTETRIC <14 WK US AND TRANSVAGINAL OB US
TECHNIQUE: Both transabdominal and transvaginal ultrasound examinations were
performed for complete evaluation of the gestation as well as the
maternal uterus, adnexal regions, and pelvic cul-de-sac.
Transvaginal technique was performed to assess early pregnancy.

[Series 1: us ob transvaginal · 0.20mm/px · 14 of 62 slices shown]
[im 3/62]
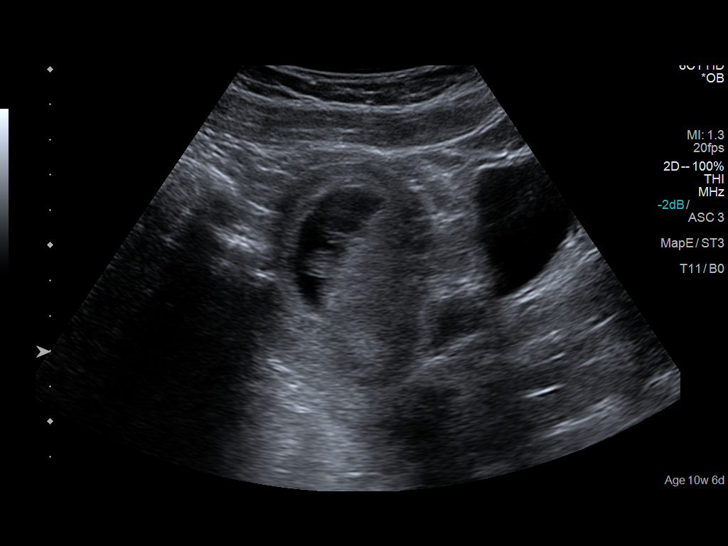
[im 7/62]
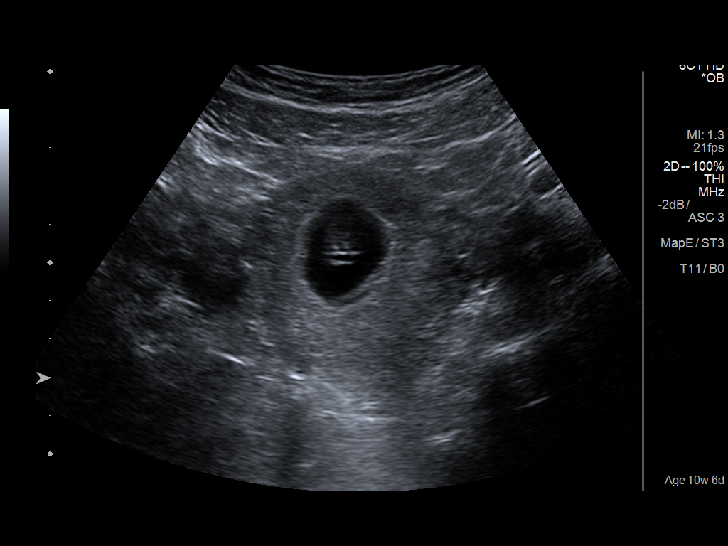
[im 12/62]
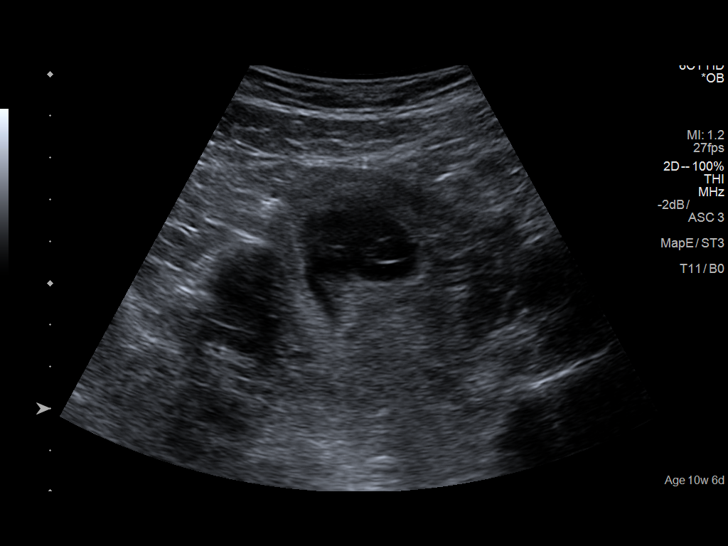
[im 16/62]
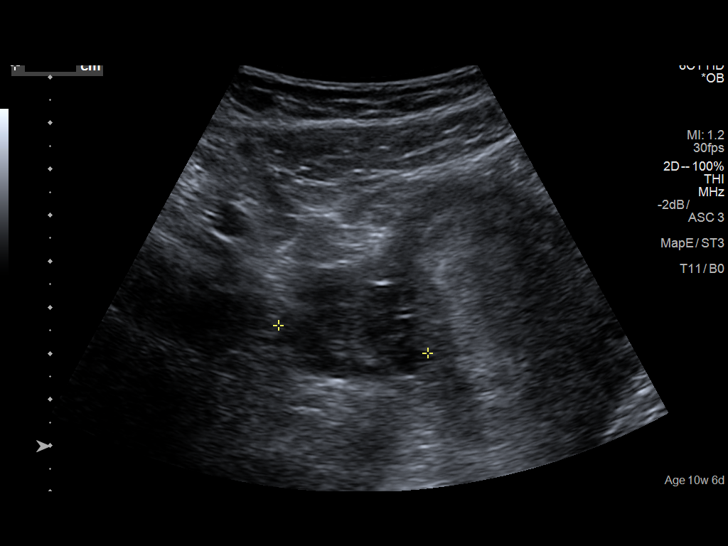
[im 21/62]
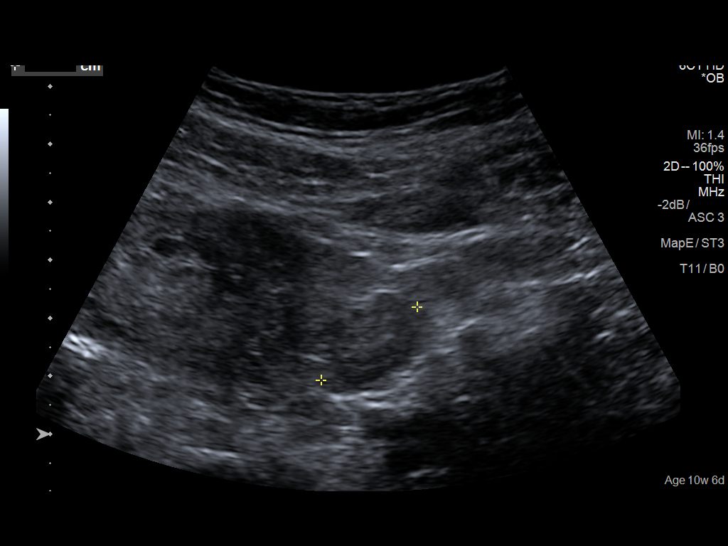
[im 25/62]
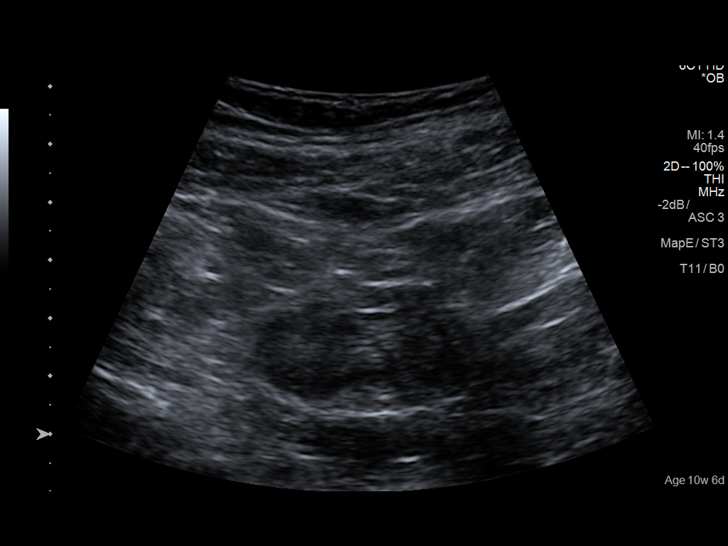
[im 30/62]
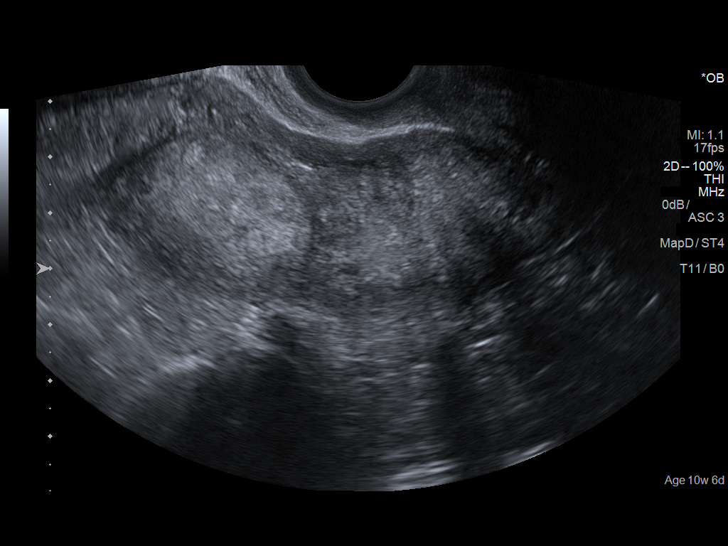
[im 34/62]
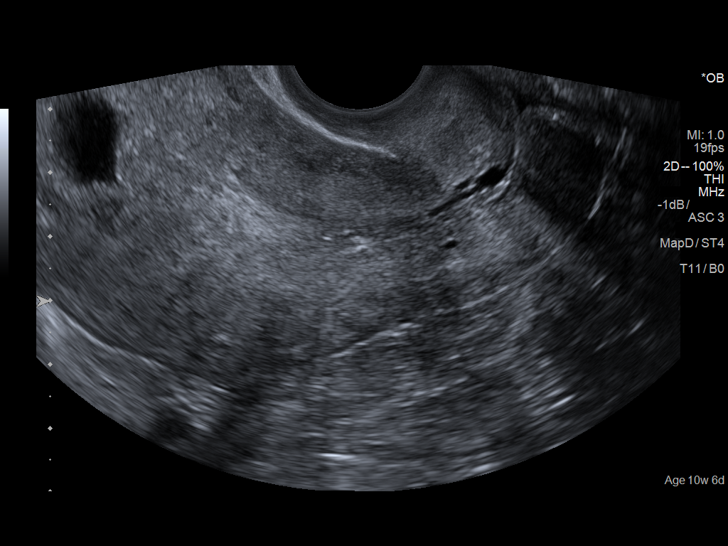
[im 39/62]
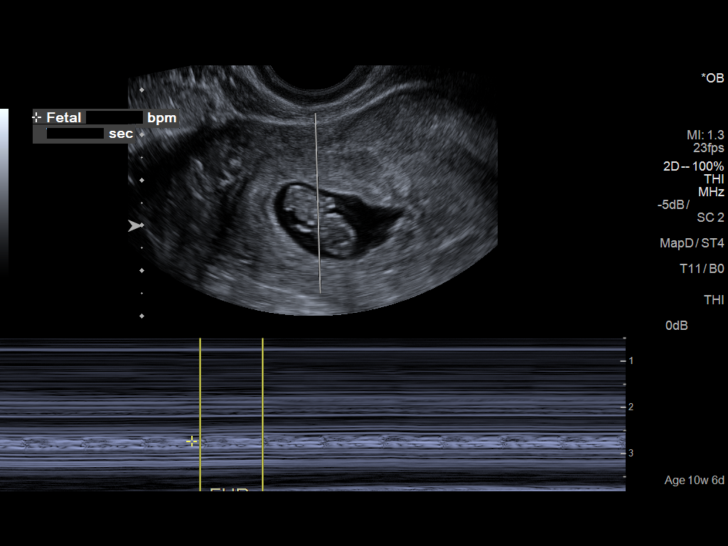
[im 43/62]
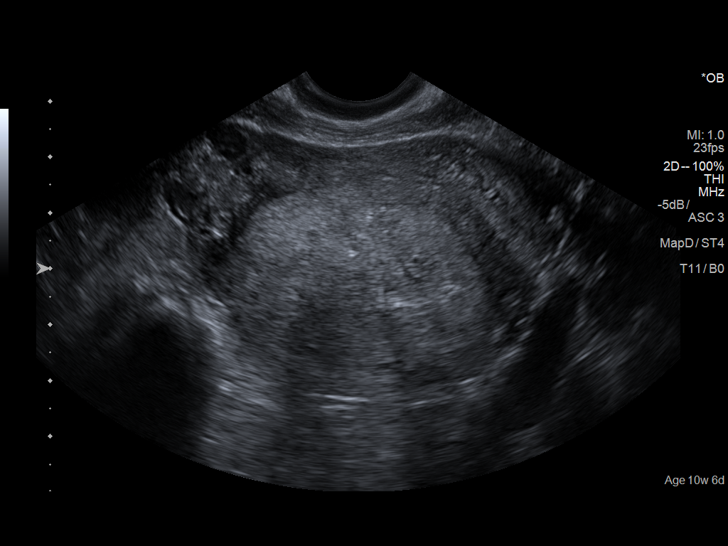
[im 48/62]
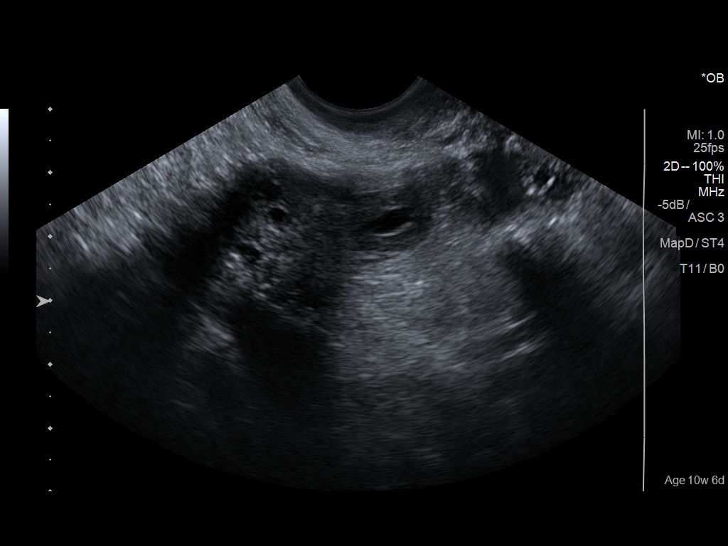
[im 52/62]
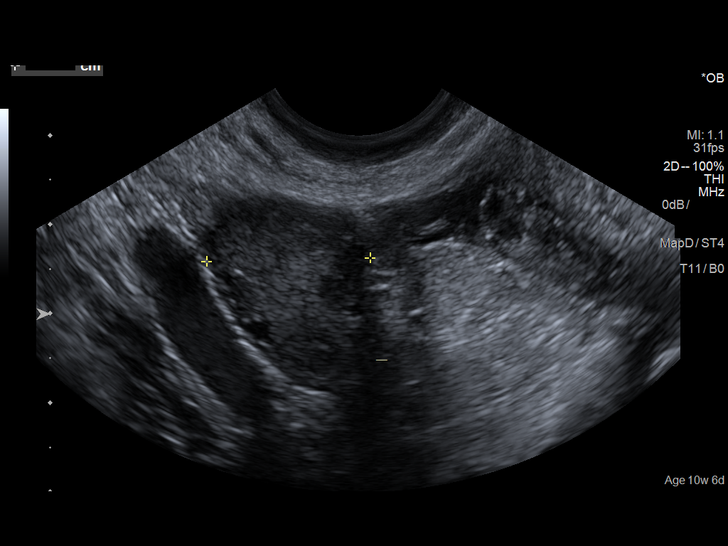
[im 57/62]
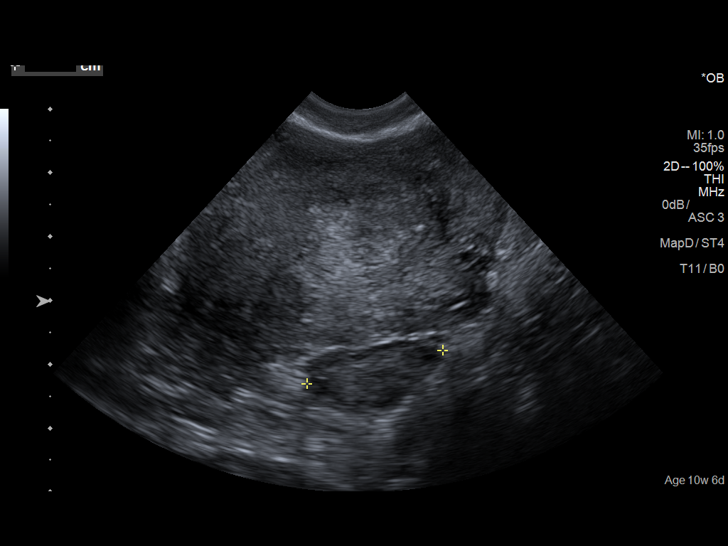
[im 62/62]
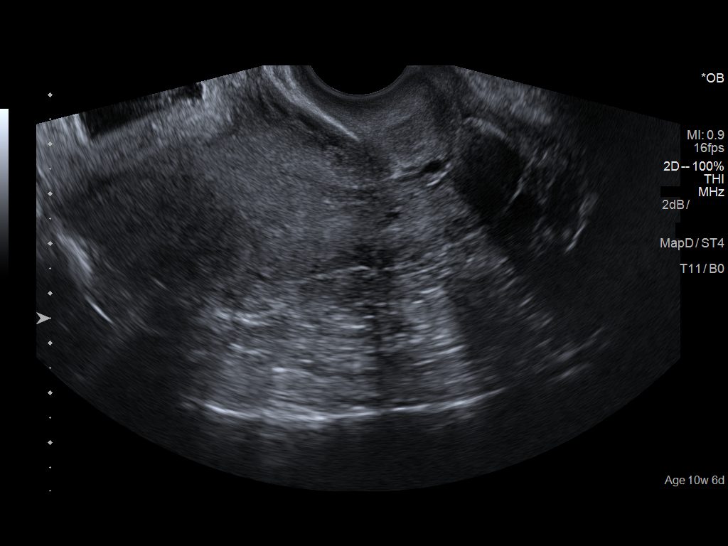

[14 of 28 positions shown; findings below may reference images not displayed]

FINDINGS: Intrauterine gestational sac: Single

Yolk sac:  Visualized

Embryo:  Visualized

Cardiac Activity: visualized

Heart Rate: 163 bpm

MSD:   mm    w     d

CRL:  18.3 mm   8 w   2 d                  US EDC: 08/01/2018

Subchorionic hemorrhage:  No subchorionic hemorrhage.

Maternal uterus/adnexae: No adnexal mass or free fluid.
IMPRESSION: Eight week 2 day intrauterine pregnancy. Fetal heart rate 163 beats
per minute. No acute maternal findings.

## 2019-11-01 ENCOUNTER — Other Ambulatory Visit: Payer: Self-pay

## 2019-11-01 ENCOUNTER — Ambulatory Visit (HOSPITAL_COMMUNITY)
Admission: EM | Admit: 2019-11-01 | Discharge: 2019-11-01 | Disposition: A | Discharge status: Home / Self Care | Payer: Medicaid Other | Attending: Family Medicine | Admitting: Family Medicine

## 2019-11-01 ENCOUNTER — Encounter (HOSPITAL_COMMUNITY): Payer: Self-pay

## 2019-11-01 DIAGNOSIS — N760 Acute vaginitis: Secondary | ICD-10-CM | POA: Insufficient documentation

## 2019-11-01 LAB — POCT URINALYSIS DIP (DEVICE)
Bilirubin Urine: NEGATIVE
Glucose, UA: 500 mg/dL — AB
Ketones, ur: NEGATIVE mg/dL
Leukocytes,Ua: NEGATIVE
Nitrite: NEGATIVE
Protein, ur: 100 mg/dL — AB
Specific Gravity, Urine: 1.015 (ref 1.005–1.030)
Urobilinogen, UA: 0.2 mg/dL (ref 0.0–1.0)
pH: 5.5 (ref 5.0–8.0)

## 2019-11-01 MED ORDER — NYSTATIN-TRIAMCINOLONE 100000-0.1 UNIT/GM-% EX CREA: TOPICAL_CREAM | CUTANEOUS | 0 refills | Status: DC

## 2019-11-01 MED ORDER — FLUCONAZOLE 150 MG PO TABS: 150.0000 mg | ORAL_TABLET | Freq: Every day | ORAL | 0 refills | Status: DC

## 2019-11-01 NOTE — Discharge Instructions (Addendum)
Treating you for a yeast infection Sending a swab and will call you with any positive results. Follow up as needed for continued or worsening symptoms Your blood pressure was very elevated today.  Make sure you pick up your blood pressure medicine and start this.

## 2019-11-01 NOTE — ED Triage Notes (Signed)
Pt reports vaginal itching and increased urinary frequency x 2-3 days. Pt denies any vaginal discharge.

## 2019-11-02 NOTE — ED Provider Notes (Signed)
[facility]    CSN: [id number] Arrival date & time: 11/01/19  1212      History   Chief Complaint Chief Complaint  Patient presents with  . Urinary Frequency  . Vaginal Itching    HPI Kathryn Shelton is a 29 y.o. female.   Patient is a 29 year old female with past medical history of diabetes, hypertension, GERD presents today with vaginal itching and urinary frequency x2 to 3 days.  Symptoms have been constant.  She has not done anything for the symptoms.  Denies any specific vaginal discharge.  Would like to be checked for STDs.  No associate abdominal pain, back pain, fever, nausea, vomiting. Unsure of LMP  ROS per HPI      Past Medical History:  Diagnosis Date  . DM (diabetes mellitus), type 2 ([place])   . Essential hypertension 03/01/2017  . GERD (gastroesophageal reflux disease)   . Hypertension   . Nausea and vomiting during pregnancy prior to [redacted] weeks gestation 01/14/2018  . Sleep apnea     Patient Active Problem List   Diagnosis Date Noted  . Oligohydramnios antepartum, second trimester, other fetus 03/09/2018  . Obesity, morbid ([place]) 03/09/2018  . Preterm premature rupture of membranes, unspecified as to length of time between rupture and onset of labor, second trimester 03/09/2018  . Supervision of high risk pregnancy, antepartum 12/31/2017  . Chronic hypertension during pregnancy, antepartum 12/31/2017  . Pre-existing type 2 diabetes mellitus during pregnancy, antepartum 12/31/2017  . Mixed hyperlipidemia 12/02/2017  . Type II diabetes mellitus, uncontrolled ([place]) 03/01/2017  . Essential hypertension 03/01/2017  . Intermittent palpitations 03/01/2017  . Panic attack 03/01/2017  . Obstructive sleep apnea syndrome 03/01/2017    History reviewed. No pertinent surgical history.  OB History    Gravida  3   Para  3   Term  2   Preterm  1   AB  0   Living  3     SAB  0   TAB  0   Ectopic  0   Multiple  0   Live Births  3            Home Medications    Prior to Admission medications   Medication Sig Start Date End Date Taking? Authorizing Provider  amLODipine (NORVASC) 5 MG tablet Take 1 tablet (5 mg total) by mouth daily. 01/11/19 02/10/19  Fatima Blank, MD  Blood Glucose Monitoring Suppl (ACCU-CHEK GUIDE) w/Device KIT 1 Device by Does not apply route 4 (four) times daily. Patient not taking: Reported on 05/27/2018 03/18/18   Osborne Oman, MD  fluconazole (DIFLUCAN) 150 MG tablet Take 1 tablet (150 mg total) by mouth daily. 11/01/19   Loura Halt A, NP  glucose blood (ACCU-CHEK GUIDE) test strip Use to check blood sugars four times a day was instructed Patient not taking: Reported on 05/27/2018 03/18/18   Anyanwu, Sallyanne Havers, MD  metFORMIN (GLUCOPHAGE) 500 MG tablet Take 1 tablet (500 mg total) by mouth 2 (two) times daily with a meal. 03/07/19   Mesner, Corene Cornea, MD  nystatin-triamcinolone [facility] II) cream Apply to affected area daily 11/01/19   Orvan July, NP    Family History Family History  Adopted: Yes  Family history unknown: Yes    Social History Social History   Tobacco Use  . Smoking status: Never Smoker  . Smokeless tobacco: Never Used  Substance Use Topics  . Alcohol use: No  . Drug use: No     Allergies  Shellfish-derived products   Review of Systems Review of Systems   Physical Exam Triage Vital Signs ED Triage Vitals  Enc Vitals Group     BP 11/01/19 1302 (!) 179/121     Pulse Rate 11/01/19 1302 87     Resp 11/01/19 1302 20     Temp 11/01/19 1302 98.8 F (37.1 C)     Temp Source 11/01/19 1302 Oral     SpO2 11/01/19 1302 96 %     Weight --      Height --      Head Circumference --      Peak Flow --      Pain Score 11/01/19 1301 0     Pain Loc --      Pain Edu? --      Excl. in [place]? --    No data found.  Updated Vital Signs BP (!) 179/121 (BP Location: Right Arm)   Pulse 87   Temp 98.8 F (37.1 C) (Oral)   Resp 20   LMP  (Within Weeks) Comment: 2 weeks   SpO2 96%   Breastfeeding No   Visual Acuity Right Eye Distance:   Left Eye Distance:   Bilateral Distance:    Right Eye Near:   Left Eye Near:    Bilateral Near:     Physical Exam Vitals and nursing note reviewed.  Constitutional:      General: She is not in acute distress.    Appearance: Normal appearance. She is not ill-appearing, toxic-appearing or diaphoretic.  HENT:     Head: Normocephalic.     Nose: Nose normal.  Eyes:     Conjunctiva/sclera: Conjunctivae normal.  Pulmonary:     Effort: Pulmonary effort is normal.  Musculoskeletal:        General: Normal range of motion.     Cervical back: Normal range of motion.  Skin:    General: Skin is warm and dry.     Findings: No rash.  Neurological:     Mental Status: She is alert.  Psychiatric:        Mood and Affect: Mood normal.      UC Treatments / Results  Labs (all labs ordered are listed, but only abnormal results are displayed) Labs Reviewed  POCT URINALYSIS DIP (DEVICE) - Abnormal; Notable for the following components:      Result Value   Glucose, UA 500 (*)    Hgb urine dipstick TRACE (*)    Protein, ur 100 (*)    All other components within normal limits  CERVICOVAGINAL ANCILLARY ONLY    EKG   Radiology No results found.  Procedures Procedures (including critical care time)  Medications Ordered in UC Medications - No data to display  Initial Impression / Assessment and Plan / UC Course  I have reviewed the triage vital signs and the nursing notes.  Pertinent labs & imaging results that were available during my care of the patient were reviewed by me and considered in my medical decision making (see chart for details).     Vaginitis Treating for yeast infection with Diflucan and Mycolog cream.  Swab sent for testing Urine without concern for infection. Patient's blood pressure elevated today.  Recommended she pick up her prescription for the pharmacy and take this today. Patient  understanding and agree.   Final Clinical Impressions(s) / UC Diagnoses   Final diagnoses:  Vaginitis and vulvovaginitis     Discharge Instructions     Treating you for a yeast  infection Sending a swab and will call you with any positive results. Follow up as needed for continued or worsening symptoms Your blood pressure was very elevated today.  Make sure you pick up your blood pressure medicine and start this.     ED Prescriptions    Medication Sig Dispense Auth. Provider   fluconazole (DIFLUCAN) 150 MG tablet Take 1 tablet (150 mg total) by mouth daily. 2 tablet Tedford Berg A, NP   nystatin-triamcinolone (MYCOLOG II) cream Apply to affected area daily 15 g Torry Adamczak A, NP     PDMP not reviewed this encounter.   Loura Halt A, NP 11/02/19 0930

## 2019-11-03 LAB — CERVICOVAGINAL ANCILLARY ONLY
Bacterial Vaginitis (gardnerella): NEGATIVE
Candida Glabrata: NEGATIVE
Candida Vaginitis: POSITIVE — AB
Chlamydia: NEGATIVE
Comment: NEGATIVE
Comment: NEGATIVE
Comment: NEGATIVE
Comment: NEGATIVE
Comment: NEGATIVE
Comment: NORMAL
Neisseria Gonorrhea: NEGATIVE
Trichomonas: NEGATIVE

## 2019-11-25 ENCOUNTER — Ambulatory Visit (HOSPITAL_COMMUNITY)
Admission: EM | Admit: 2019-11-25 | Discharge: 2019-11-25 | Disposition: A | Discharge status: Home / Self Care | Payer: Medicaid Other | Attending: Internal Medicine | Admitting: Internal Medicine

## 2019-11-25 ENCOUNTER — Other Ambulatory Visit: Payer: Self-pay

## 2019-11-25 ENCOUNTER — Encounter (HOSPITAL_COMMUNITY): Payer: Self-pay

## 2019-11-25 DIAGNOSIS — I10 Essential (primary) hypertension: Secondary | ICD-10-CM | POA: Diagnosis not present

## 2019-11-25 DIAGNOSIS — Z3202 Encounter for pregnancy test, result negative: Secondary | ICD-10-CM

## 2019-11-25 DIAGNOSIS — M67431 Ganglion, right wrist: Secondary | ICD-10-CM

## 2019-11-25 LAB — POC URINE PREG, ED: Preg Test, Ur: NEGATIVE

## 2019-11-25 MED ORDER — AMLODIPINE BESYLATE 5 MG PO TABS: 5.0000 mg | ORAL_TABLET | Freq: Every day | ORAL | 0 refills | Status: DC

## 2019-11-25 NOTE — Discharge Instructions (Addendum)
Restart your blood pressure medication today. Follow up with your family Dr next week' Keep the splint on for 7 days, and follow up with your doctor if this is still bothering you.

## 2019-11-25 NOTE — ED Triage Notes (Signed)
Pt c/o fever, pt has not taken temperature, but states her body has been feeling hot. Pt c/o right wrist pain and swellingx3days. Pt wants pregnancy test. Pt has 1+ swelling of right wrist. Pt states her wrist hit the floor when she was cleaning up.

## 2019-11-25 NOTE — ED Provider Notes (Signed)
[place]    CSN: [id number] Arrival date & time: 11/25/19  1238      History   Chief Complaint Chief Complaint  Patient presents with  . multiple complaints    HPI Kathryn Shelton is a 29 y.o. female. presents with multiple complaints 1- Has felt like she had a fever x 2 days, feeling her body was hot, but did not check her temp. She denies URI or GU symptoms. Has been off HTN med for several months and was d/c by her PCP.  2- R wrist pain since she hit her R wrist accidentally in her sleep when sleeping on the floor.     Past Medical History:  Diagnosis Date  . DM (diabetes mellitus), type 2 ([place])   . Essential hypertension 03/01/2017  . GERD (gastroesophageal reflux disease)   . Hypertension   . Nausea and vomiting during pregnancy prior to [redacted] weeks gestation 01/14/2018  . Sleep apnea     Patient Active Problem List   Diagnosis Date Noted  . Oligohydramnios antepartum, second trimester, other fetus 03/09/2018  . Obesity, morbid ([place]) 03/09/2018  . Preterm premature rupture of membranes, unspecified as to length of time between rupture and onset of labor, second trimester 03/09/2018  . Supervision of high risk pregnancy, antepartum 12/31/2017  . Chronic hypertension during pregnancy, antepartum 12/31/2017  . Pre-existing type 2 diabetes mellitus during pregnancy, antepartum 12/31/2017  . Mixed hyperlipidemia 12/02/2017  . Type II diabetes mellitus, uncontrolled ([place]) 03/01/2017  . Essential hypertension 03/01/2017  . Intermittent palpitations 03/01/2017  . Panic attack 03/01/2017  . Obstructive sleep apnea syndrome 03/01/2017    History reviewed. No pertinent surgical history.  OB History    Gravida  3   Para  3   Term  2   Preterm  1   AB  0   Living  3     SAB  0   TAB  0   Ectopic  0   Multiple  0   Live Births  3            Home Medications    Prior to Admission medications   Medication Sig Start Date End Date Taking?  Authorizing Provider  amLODipine (NORVASC) 5 MG tablet Take 1 tablet (5 mg total) by mouth daily. 01/11/19 02/10/19  Fatima Blank, MD  Blood Glucose Monitoring Suppl (ACCU-CHEK GUIDE) w/Device KIT 1 Device by Does not apply route 4 (four) times daily. Patient not taking: Reported on 05/27/2018 03/18/18   Osborne Oman, MD  glucose blood (ACCU-CHEK GUIDE) test strip Use to check blood sugars four times a day was instructed Patient not taking: Reported on 05/27/2018 03/18/18   Osborne Oman, MD  metFORMIN (GLUCOPHAGE) 500 MG tablet Take 1 tablet (500 mg total) by mouth 2 (two) times daily with a meal. 03/07/19   Mesner, Corene Cornea, MD    Family History Family History  Adopted: Yes  Family history unknown: Yes    Social History Social History   Tobacco Use  . Smoking status: Never Smoker  . Smokeless tobacco: Never Used  Vaping Use  . Vaping Use: Never used  Substance Use Topics  . Alcohol use: No  . Drug use: No     Allergies   Shellfish-derived products   Review of Systems Review of Systems  Constitutional: Negative for appetite change, chills, diaphoresis, fatigue and fever.  HENT: Negative for congestion, ear pain, postnasal drip, rhinorrhea and sore throat.   Eyes: Negative  for discharge.  Respiratory: Negative for cough, chest tightness and shortness of breath.   Cardiovascular: Negative for chest pain and leg swelling.  Musculoskeletal: Positive for arthralgias. Negative for gait problem.  Skin: Negative for rash.  Neurological: Negative for dizziness, speech difficulty, weakness, numbness and headaches.     Physical Exam Triage Vital Signs ED Triage Vitals  Enc Vitals Group     BP 11/25/19 1303 (!) 164/111     Pulse Rate 11/25/19 1303 97     Resp 11/25/19 1303 16     Temp 11/25/19 1303 98.9 F (37.2 C)     Temp Source 11/25/19 1303 Oral     SpO2 11/25/19 1303 99 %     Weight 11/25/19 1304 241 lb (109.3 kg)     Height 11/25/19 1304 5' 6"  (1.676 m)       Head Circumference --      Peak Flow --      Pain Score 11/25/19 1304 10     Pain Loc --      Pain Edu? --      Excl. in [place]? --    No data found.  Updated Vital Signs BP (!) 164/111   Pulse 97   Temp 98.9 F (37.2 C) (Oral)   Resp 16   Ht 5' 6"  (1.676 m)   Wt 241 lb (109.3 kg)   SpO2 99%   BMI 38.90 kg/m  Repeated BP 158/122  Visual Acuity Right Eye Distance:   Left Eye Distance:   Bilateral Distance:    Right Eye Near:   Left Eye Near:    Bilateral Near:     Physical Exam Vitals and nursing note reviewed.  Constitutional:      General: She is not in acute distress.    Appearance: She is obese. She is not toxic-appearing.  HENT:     Head: Atraumatic.     Right Ear: Tympanic membrane and ear canal normal.     Left Ear: Tympanic membrane and ear canal normal.     Nose: Nose normal. No congestion or rhinorrhea.     Mouth/Throat:     Mouth: Mucous membranes are moist.     Pharynx: Oropharynx is clear.  Eyes:     General: No scleral icterus.    Conjunctiva/sclera: Conjunctivae normal.  Cardiovascular:     Rate and Rhythm: Normal rate and regular rhythm.     Heart sounds: No murmur heard.   Pulmonary:     Effort: Pulmonary effort is normal.     Breath sounds: Normal breath sounds.  Musculoskeletal:        General: Normal range of motion.     Cervical back: Neck supple. No rigidity.     Right lower leg: No edema.     Left lower leg: No edema.     Comments: R WRIST- central volar area with dime size rubbery lump which is tender. It is not hot or red.   Neurological:     Mental Status: She is alert and oriented to person, place, and time.     Gait: Gait normal.  Psychiatric:        Mood and Affect: Mood normal.        Behavior: Behavior normal.        Thought Content: Thought content normal.        Judgment: Judgment normal.      UC Treatments / Results  Labs (all labs ordered are listed, but only abnormal results are displayed)  Labs Reviewed   POC URINE PREG, ED    EKG   Radiology No results found.  Procedures Procedures (including critical care time)  Medications Ordered in UC Medications - No data to display  Initial Impression / Assessment and Plan / UC Course  I have reviewed the triage vital signs and the nursing notes. She was placed on a wrist splint and back on her Norvac for HTn and needs to Fu with PCp next week Final Clinical Impressions(s) / UC Diagnoses   Final diagnoses:  None   Discharge Instructions   None    ED Prescriptions    None     PDMP not reviewed this encounter.   Shelby Mattocks, PA-C 11/25/19 1352

## 2019-12-12 ENCOUNTER — Telehealth: Payer: Self-pay | Admitting: *Deleted

## 2019-12-12 NOTE — Telephone Encounter (Signed)
Patient contacted in response to Red Emmi ,scheduled NP patient appointment for 02/15/20

## 2020-02-10 ENCOUNTER — Ambulatory Visit (INDEPENDENT_AMBULATORY_CARE_PROVIDER_SITE_OTHER): Payer: Medicaid Other

## 2020-02-10 ENCOUNTER — Encounter (HOSPITAL_COMMUNITY): Payer: Self-pay

## 2020-02-10 ENCOUNTER — Ambulatory Visit (HOSPITAL_COMMUNITY)
Admission: EM | Admit: 2020-02-10 | Discharge: 2020-02-10 | Disposition: A | Discharge status: Home / Self Care | Payer: Medicaid Other | Attending: Emergency Medicine | Admitting: Emergency Medicine

## 2020-02-10 ENCOUNTER — Other Ambulatory Visit: Payer: Self-pay

## 2020-02-10 DIAGNOSIS — R079 Chest pain, unspecified: Secondary | ICD-10-CM

## 2020-02-10 DIAGNOSIS — Z20822 Contact with and (suspected) exposure to covid-19: Secondary | ICD-10-CM | POA: Insufficient documentation

## 2020-02-10 DIAGNOSIS — M7989 Other specified soft tissue disorders: Secondary | ICD-10-CM | POA: Diagnosis not present

## 2020-02-10 DIAGNOSIS — E782 Mixed hyperlipidemia: Secondary | ICD-10-CM | POA: Diagnosis not present

## 2020-02-10 DIAGNOSIS — M79672 Pain in left foot: Secondary | ICD-10-CM

## 2020-02-10 DIAGNOSIS — R609 Edema, unspecified: Secondary | ICD-10-CM | POA: Diagnosis not present

## 2020-02-10 DIAGNOSIS — R0602 Shortness of breath: Secondary | ICD-10-CM | POA: Diagnosis not present

## 2020-02-10 DIAGNOSIS — M25475 Effusion, left foot: Secondary | ICD-10-CM

## 2020-02-10 DIAGNOSIS — Z7984 Long term (current) use of oral hypoglycemic drugs: Secondary | ICD-10-CM | POA: Diagnosis not present

## 2020-02-10 DIAGNOSIS — I10 Essential (primary) hypertension: Secondary | ICD-10-CM | POA: Diagnosis not present

## 2020-02-10 DIAGNOSIS — M2550 Pain in unspecified joint: Secondary | ICD-10-CM | POA: Insufficient documentation

## 2020-02-10 DIAGNOSIS — Y939 Activity, unspecified: Secondary | ICD-10-CM | POA: Diagnosis not present

## 2020-02-10 DIAGNOSIS — R030 Elevated blood-pressure reading, without diagnosis of hypertension: Secondary | ICD-10-CM

## 2020-02-10 DIAGNOSIS — M25472 Effusion, left ankle: Secondary | ICD-10-CM

## 2020-02-10 DIAGNOSIS — Y929 Unspecified place or not applicable: Secondary | ICD-10-CM | POA: Insufficient documentation

## 2020-02-10 DIAGNOSIS — S9032XA Contusion of left foot, initial encounter: Secondary | ICD-10-CM | POA: Diagnosis not present

## 2020-02-10 DIAGNOSIS — E118 Type 2 diabetes mellitus with unspecified complications: Secondary | ICD-10-CM | POA: Insufficient documentation

## 2020-02-10 DIAGNOSIS — M109 Gout, unspecified: Secondary | ICD-10-CM | POA: Insufficient documentation

## 2020-02-10 DIAGNOSIS — M25572 Pain in left ankle and joints of left foot: Secondary | ICD-10-CM

## 2020-02-10 DIAGNOSIS — Z7901 Long term (current) use of anticoagulants: Secondary | ICD-10-CM | POA: Insufficient documentation

## 2020-02-10 LAB — CBC WITH DIFFERENTIAL/PLATELET
Abs Immature Granulocytes: 0.01 10*3/uL (ref 0.00–0.07)
Basophils Absolute: 0 10*3/uL (ref 0.0–0.1)
Basophils Relative: 0 %
Eosinophils Absolute: 0 10*3/uL (ref 0.0–0.5)
Eosinophils Relative: 1 %
HCT: 43.4 % (ref 36.0–46.0)
Hemoglobin: 14.5 g/dL (ref 12.0–15.0)
Immature Granulocytes: 0 %
Lymphocytes Relative: 30 %
Lymphs Abs: 1.6 10*3/uL (ref 0.7–4.0)
MCH: 26.8 pg (ref 26.0–34.0)
MCHC: 33.4 g/dL (ref 30.0–36.0)
MCV: 80.2 fL (ref 80.0–100.0)
Monocytes Absolute: 0.3 10*3/uL (ref 0.1–1.0)
Monocytes Relative: 7 %
Neutro Abs: 3.3 10*3/uL (ref 1.7–7.7)
Neutrophils Relative %: 62 %
Platelets: 316 10*3/uL (ref 150–400)
RBC: 5.41 MIL/uL — ABNORMAL HIGH (ref 3.87–5.11)
RDW: 13.1 % (ref 11.5–15.5)
WBC: 5.2 10*3/uL (ref 4.0–10.5)
nRBC: 0 % (ref 0.0–0.2)

## 2020-02-10 LAB — SARS CORONAVIRUS 2 (TAT 6-24 HRS): SARS Coronavirus 2: NEGATIVE

## 2020-02-10 LAB — URIC ACID: Uric Acid, Serum: 9.1 mg/dL — ABNORMAL HIGH (ref 2.5–7.1)

## 2020-02-10 MED ORDER — ALBUTEROL SULFATE HFA 108 (90 BASE) MCG/ACT IN AERS
1.0000 | INHALATION_SPRAY | Freq: Four times a day (QID) | RESPIRATORY_TRACT | 0 refills | Status: DC | PRN
Start: 2020-02-10 — End: 2020-02-15

## 2020-02-10 NOTE — ED Provider Notes (Signed)
[place]    CSN: [id number] Arrival date & time: 02/10/20  1246      History   Chief Complaint Chief Complaint  Patient presents with   Foot Pain   Shortness of Breath    HPI Kathryn Shelton is a 29 y.o. female.   29 yo female who presents for evaluation of SOB x 1 week. She has not had any cough, fever, congestion, runny nose, ST, wheezing, N/V/D, or body aches. She also developed pain and swelling to her left foot and ankle at the same time and has no memory of trauma or injury.      Past Medical History:  Diagnosis Date   DM (diabetes mellitus), type 2 ([place])    Essential hypertension 03/01/2017   GERD (gastroesophageal reflux disease)    Hypertension    Nausea and vomiting during pregnancy prior to [redacted] weeks gestation 01/14/2018   Sleep apnea     Patient Active Problem List   Diagnosis Date Noted   Oligohydramnios antepartum, second trimester, other fetus 03/09/2018   Obesity, morbid ([place]) 03/09/2018   Preterm premature rupture of membranes, unspecified as to length of time between rupture and onset of labor, second trimester 03/09/2018   Supervision of high risk pregnancy, antepartum 12/31/2017   Chronic hypertension during pregnancy, antepartum 12/31/2017   Pre-existing type 2 diabetes mellitus during pregnancy, antepartum 12/31/2017   Mixed hyperlipidemia 12/02/2017   Type II diabetes mellitus, uncontrolled ([place]) 03/01/2017   Essential hypertension 03/01/2017   Intermittent palpitations 03/01/2017   Panic attack 03/01/2017   Obstructive sleep apnea syndrome 03/01/2017    History reviewed. No pertinent surgical history.  OB History    Gravida  3   Para  3   Term  2   Preterm  1   AB  0   Living  3     SAB  0   TAB  0   Ectopic  0   Multiple  0   Live Births  3            Home Medications    Prior to Admission medications   Medication Sig Start Date End Date Taking? Authorizing Provider    amLODipine (NORVASC) 5 MG tablet Take 1 tablet (5 mg total) by mouth daily. 11/25/19 12/25/19  Rodriguez-Southworth, Sunday Spillers, PA-C  Blood Glucose Monitoring Suppl (ACCU-CHEK GUIDE) w/Device KIT 1 Device by Does not apply route 4 (four) times daily. Patient not taking: Reported on 05/27/2018 03/18/18   Osborne Oman, MD  glucose blood (ACCU-CHEK GUIDE) test strip Use to check blood sugars four times a day was instructed Patient not taking: Reported on 05/27/2018 03/18/18   Osborne Oman, MD  metFORMIN (GLUCOPHAGE) 500 MG tablet Take 1 tablet (500 mg total) by mouth 2 (two) times daily with a meal. 03/07/19   Mesner, Corene Cornea, MD    Family History Family History  Adopted: Yes  Family history unknown: Yes    Social History Social History   Tobacco Use   Smoking status: Never Smoker   Smokeless tobacco: Never Used  Scientific laboratory technician Use: Never used  Substance Use Topics   Alcohol use: No   Drug use: No     Allergies   Shellfish-derived products and Sulfa antibiotics   Review of Systems Review of Systems  Constitutional: Negative for activity change, chills, fatigue and fever.  HENT: Negative for congestion, ear pain, rhinorrhea, sinus pressure and sore throat.   Respiratory: Positive for shortness of breath.  Negative for cough and wheezing.   Cardiovascular: Negative for chest pain.  Gastrointestinal: Negative for abdominal pain, diarrhea, nausea and vomiting.  Genitourinary: Negative.   Musculoskeletal: Positive for arthralgias and joint swelling. Negative for back pain and myalgias.  Skin: Negative.   Neurological: Negative for weakness and headaches.  Hematological: Negative.   Psychiatric/Behavioral: Negative.      Physical Exam Triage Vital Signs ED Triage Vitals  Enc Vitals Group     BP 02/10/20 1454 (!) 138/103     Pulse Rate 02/10/20 1454 (!) 102     Resp 02/10/20 1454 20     Temp 02/10/20 1454 99.4 F (37.4 C)     Temp Source 02/10/20 1454 Oral      SpO2 02/10/20 1454 98 %     Weight --      Height --      Head Circumference --      Peak Flow --      Pain Score 02/10/20 1453 10     Pain Loc --      Pain Edu? --      Excl. in [place]? --    No data found.  Updated Vital Signs BP (!) 138/103 (BP Location: Left Arm)    Pulse (!) 102    Temp 99.4 F (37.4 C) (Oral)    Resp 20    LMP  (Within Weeks) Comment: 3-4 weeks   SpO2 98%   Visual Acuity Right Eye Distance:   Left Eye Distance:   Bilateral Distance:    Right Eye Near:   Left Eye Near:    Bilateral Near:     Physical Exam Vitals and nursing note reviewed.  Constitutional:      Appearance: She is well-developed. She is obese.  HENT:     Head: Normocephalic and atraumatic.     Mouth/Throat:     Mouth: Mucous membranes are moist.     Pharynx: Oropharynx is clear.  Eyes:     Extraocular Movements: Extraocular movements intact.     Pupils: Pupils are equal, round, and reactive to light.  Cardiovascular:     Rate and Rhythm: Normal rate and regular rhythm.     Pulses:          Dorsalis pedis pulses are 2+ on the left side.       Posterior tibial pulses are 2+ on the left side.  Pulmonary:     Effort: Pulmonary effort is normal.     Breath sounds: Examination of the right-middle field reveals decreased breath sounds. Examination of the left-middle field reveals decreased breath sounds. Examination of the right-lower field reveals decreased breath sounds. Examination of the left-lower field reveals decreased breath sounds. Decreased breath sounds present. No wheezing, rhonchi or rales.  Musculoskeletal:     Cervical back: Normal range of motion and neck supple.     Left lower leg: Tenderness present. Edema present.     Left foot: Decreased range of motion.  Feet:     Left foot:     Skin integrity: Erythema and warmth present.     Comments: The lateral malleolus of the left ankle is swollen, warm, and mildly erythematous. There is tenderness to medial and lateral malleoli  and lateral mid-foot. Pt has active ROM with pain.  Lymphadenopathy:     Cervical: No cervical adenopathy.  Skin:    General: Skin is warm and dry.     Capillary Refill: Capillary refill takes less than 2 seconds.  Neurological:  General: No focal deficit present.     Mental Status: She is alert and oriented to person, place, and time.  Psychiatric:        Mood and Affect: Mood normal.        Behavior: Behavior normal.      UC Treatments / Results  Labs (all labs ordered are listed, but only abnormal results are displayed) Labs Reviewed  SARS CORONAVIRUS 2 (TAT 6-24 HRS)  CBC WITH DIFFERENTIAL/PLATELET  HIGH SENSITIVITY CRP  URIC ACID    EKG   Radiology DG Chest 2 View  Result Date: 02/10/2020 CLINICAL DATA:  Pain and swelling, shortness of breath 1 week, high blood pressure and diabetes. Nonsmoker. EXAM: CHEST - 2 VIEW COMPARISON:  Chest x-ray 05/25/2019 FINDINGS: The heart size and mediastinal contours are within normal limits. No focal consolidation. No pulmonary edema. No pleural effusion. No pneumothorax. No acute osseous abnormality. IMPRESSION: No active cardiopulmonary disease. Electronically Signed   By: Iven Finn M.D.   On: 02/10/2020 17:47   DG Ankle Complete Left  Result Date: 02/10/2020 CLINICAL DATA:  Pain and swelling X 1 week, weight bearing in walking difficulty with pain. Mostly lateral malleolus pain and lateral foot pain. No known injury. EXAM: LEFT FOOT - COMPLETE 3+ VIEW; LEFT ANKLE COMPLETE - 3+ VIEW COMPARISON:  None. FINDINGS: Left foot: There is no evidence of fracture or dislocation. Posterior calcaneal spur and stated process of the talus noted. There is no evidence of arthropathy or other focal bone abnormality. Mild dorsal subcutaneus soft tissue edema the foot. Left ankle: There is no evidence of fracture or dislocation. Trace left ankle joint effusion. Normal ankle mortise alignment. There is no evidence of arthropathy or other focal bone  abnormality. Mild lateral subcutaneus soft tissue edema. IMPRESSION: Trace left ankle joint effusion with no underlying acute fracture or dislocation of the bones of the left foot and left ankle. Electronically Signed   By: Iven Finn M.D.   On: 02/10/2020 17:46   DG Foot Complete Left  Result Date: 02/10/2020 CLINICAL DATA:  Pain and swelling X 1 week, weight bearing in walking difficulty with pain. Mostly lateral malleolus pain and lateral foot pain. No known injury. EXAM: LEFT FOOT - COMPLETE 3+ VIEW; LEFT ANKLE COMPLETE - 3+ VIEW COMPARISON:  None. FINDINGS: Left foot: There is no evidence of fracture or dislocation. Posterior calcaneal spur and stated process of the talus noted. There is no evidence of arthropathy or other focal bone abnormality. Mild dorsal subcutaneus soft tissue edema the foot. Left ankle: There is no evidence of fracture or dislocation. Trace left ankle joint effusion. Normal ankle mortise alignment. There is no evidence of arthropathy or other focal bone abnormality. Mild lateral subcutaneus soft tissue edema. IMPRESSION: Trace left ankle joint effusion with no underlying acute fracture or dislocation of the bones of the left foot and left ankle. Electronically Signed   By: Iven Finn M.D.   On: 02/10/2020 17:46    Procedures Procedures (including critical care time)  Medications Ordered in UC Medications - No data to display  Initial Impression / Assessment and Plan / UC Course  I have reviewed the triage vital signs and the nursing notes.  Pertinent labs & imaging results that were available during my care of the patient were reviewed by me and considered in my medical decision making (see chart for details).   Patient presents with SOB x 1 week without cough or fever but she is tachycardic. Additionally she has decreased  lung sounds in middle and bases bilat. Will order CXR for evaluation.  Left foot is swollen, red, warm, and tender. No history of trauma.  Will obtain imaging as well as check CBC, Uric acid, and inflammatory markers. There is a strong family history of gout.    Final Clinical Impressions(s) / UC Diagnoses   Final diagnoses:  Contusion of left foot, initial encounter  Shortness of breath  Acute gout of left ankle, unspecified cause     Discharge Instructions     Keep your left foot and ankle elevated as much as possible and take Ibuprofen 600 mg every 6 hours with food for up to 5 days.  Epsom salt soaks or moist heat can also help with pain and inflammation. Eating 8 ounces of dark or tart cherries daily can also be helpful.  Use the Albuterol inhaler, 2 puffs every 4-6 hours as needed for shortness of breath. Isolate until we have the results of your COVID swab back.   Return for worsening symptoms-     ED Prescriptions    None     PDMP not reviewed this encounter.   Margarette Canada, NP 02/10/20 1846

## 2020-02-10 NOTE — ED Triage Notes (Signed)
Pt presents with pain and swelling in the left foot and shortness of breath on exertion x 1 week. Denies fall, trauma, cough, fever.

## 2020-02-10 NOTE — Discharge Instructions (Addendum)
Keep your left foot and ankle elevated as much as possible and take Ibuprofen 600 mg every 6 hours with food for up to 5 days.  Epsom salt soaks or moist heat can also help with pain and inflammation. Eating 8 ounces of dark or tart cherries daily can also be helpful.  Use the Albuterol inhaler, 2 puffs every 4-6 hours as needed for shortness of breath. Isolate until we have the results of your COVID swab back.   Return for worsening symptoms-

## 2020-02-12 LAB — HIGH SENSITIVITY CRP: CRP, High Sensitivity: 80.07 mg/L — ABNORMAL HIGH (ref 0.00–3.00)

## 2020-02-15 ENCOUNTER — Ambulatory Visit: Payer: Medicaid Other | Attending: Nurse Practitioner | Admitting: Nurse Practitioner

## 2020-02-15 ENCOUNTER — Ambulatory Visit: Payer: Medicaid Other | Admitting: Nurse Practitioner

## 2020-02-15 ENCOUNTER — Encounter: Payer: Self-pay | Admitting: Nurse Practitioner

## 2020-02-15 VITALS — Ht 66.0 in | Wt 244.0 lb

## 2020-02-15 DIAGNOSIS — F329 Major depressive disorder, single episode, unspecified: Secondary | ICD-10-CM | POA: Diagnosis not present

## 2020-02-15 DIAGNOSIS — K219 Gastro-esophageal reflux disease without esophagitis: Secondary | ICD-10-CM | POA: Insufficient documentation

## 2020-02-15 DIAGNOSIS — E782 Mixed hyperlipidemia: Secondary | ICD-10-CM | POA: Insufficient documentation

## 2020-02-15 DIAGNOSIS — Z91120 Patient's intentional underdosing of medication regimen due to financial hardship: Secondary | ICD-10-CM | POA: Diagnosis not present

## 2020-02-15 DIAGNOSIS — J45909 Unspecified asthma, uncomplicated: Secondary | ICD-10-CM | POA: Diagnosis not present

## 2020-02-15 DIAGNOSIS — Z7689 Persons encountering health services in other specified circumstances: Secondary | ICD-10-CM | POA: Diagnosis not present

## 2020-02-15 DIAGNOSIS — F419 Anxiety disorder, unspecified: Secondary | ICD-10-CM | POA: Diagnosis not present

## 2020-02-15 DIAGNOSIS — Z590 Homelessness: Secondary | ICD-10-CM | POA: Insufficient documentation

## 2020-02-15 DIAGNOSIS — T465X6A Underdosing of other antihypertensive drugs, initial encounter: Secondary | ICD-10-CM | POA: Insufficient documentation

## 2020-02-15 DIAGNOSIS — Z1159 Encounter for screening for other viral diseases: Secondary | ICD-10-CM | POA: Insufficient documentation

## 2020-02-15 DIAGNOSIS — M109 Gout, unspecified: Secondary | ICD-10-CM | POA: Diagnosis not present

## 2020-02-15 DIAGNOSIS — I10 Essential (primary) hypertension: Secondary | ICD-10-CM | POA: Insufficient documentation

## 2020-02-15 DIAGNOSIS — T383X6A Underdosing of insulin and oral hypoglycemic [antidiabetic] drugs, initial encounter: Secondary | ICD-10-CM | POA: Insufficient documentation

## 2020-02-15 DIAGNOSIS — E1165 Type 2 diabetes mellitus with hyperglycemia: Secondary | ICD-10-CM | POA: Diagnosis not present

## 2020-02-15 DIAGNOSIS — G4730 Sleep apnea, unspecified: Secondary | ICD-10-CM | POA: Diagnosis not present

## 2020-02-15 DIAGNOSIS — T461X6A Underdosing of calcium-channel blockers, initial encounter: Secondary | ICD-10-CM | POA: Insufficient documentation

## 2020-02-15 DIAGNOSIS — T43226A Underdosing of selective serotonin reuptake inhibitors, initial encounter: Secondary | ICD-10-CM | POA: Diagnosis not present

## 2020-02-15 DIAGNOSIS — F32A Depression, unspecified: Secondary | ICD-10-CM

## 2020-02-15 DIAGNOSIS — G4733 Obstructive sleep apnea (adult) (pediatric): Secondary | ICD-10-CM

## 2020-02-15 MED ORDER — BD PEN NEEDLE MINI U/F 31G X 5 MM MISC: 3 refills | Status: AC

## 2020-02-15 MED ORDER — ALBUTEROL SULFATE HFA 108 (90 BASE) MCG/ACT IN AERS: 1.0000 | INHALATION_SPRAY | Freq: Four times a day (QID) | RESPIRATORY_TRACT | 0 refills | Status: AC | PRN

## 2020-02-15 MED ORDER — SERTRALINE HCL 50 MG PO TABS
50.0000 mg | ORAL_TABLET | Freq: Every day | ORAL | 1 refills | Status: AC
Start: 2020-02-15 — End: 2020-05-15

## 2020-02-15 MED ORDER — AMLODIPINE BESYLATE 5 MG PO TABS: 5.0000 mg | ORAL_TABLET | Freq: Every day | ORAL | 0 refills | Status: DC

## 2020-02-15 MED ORDER — VICTOZA 18 MG/3ML ~~LOC~~ SOPN: PEN_INJECTOR | SUBCUTANEOUS | 3 refills | Status: DC

## 2020-02-15 MED ORDER — BLOOD PRESSURE MONITOR DEVI: 0 refills | Status: AC

## 2020-02-15 MED ORDER — LOSARTAN POTASSIUM 25 MG PO TABS: 25.0000 mg | ORAL_TABLET | Freq: Every day | ORAL | 0 refills | Status: DC

## 2020-02-15 MED ORDER — GLIPIZIDE 5 MG PO TABS: 5.0000 mg | ORAL_TABLET | Freq: Every day | ORAL | 0 refills | Status: DC

## 2020-02-15 MED ORDER — HYDROXYZINE HCL 25 MG PO TABS: 25.0000 mg | ORAL_TABLET | Freq: Three times a day (TID) | ORAL | 1 refills | Status: AC | PRN

## 2020-02-15 MED ORDER — METFORMIN HCL 500 MG PO TABS: 500.0000 mg | ORAL_TABLET | Freq: Two times a day (BID) | ORAL | 0 refills | Status: AC

## 2020-02-15 MED ORDER — ATORVASTATIN CALCIUM 20 MG PO TABS: 20.0000 mg | ORAL_TABLET | Freq: Every day | ORAL | 1 refills | Status: DC

## 2020-02-15 NOTE — Progress Notes (Signed)
Virtual Visit via Telephone Note Due to national recommendations of social distancing due to [place] 19, telehealth visit is felt to be most appropriate for this patient at this time.  I discussed the limitations, risks, security and privacy concerns of performing an evaluation and management service by telephone and the availability of in person appointments. I also discussed with the patient that there may be a patient responsible charge related to this service. The patient expressed understanding and agreed to proceed.    I connected with Kathryn Shelton on 02/15/20  at   8:50 AM EDT  EDT by telephone and verified that I am speaking with the correct person using two identifiers.   Consent I discussed the limitations, risks, security and privacy concerns of performing an evaluation and management service by telephone and the availability of in person appointments. I also discussed with the patient that there may be a patient responsible charge related to this service. The patient expressed understanding and agreed to proceed.   Location of Patient: Private Residence   Location of Provider: [place] and [employer] Office    Persons participating in Telemedicine visit: Kathryn Rankins FNP-BC [place]    History of Present Illness: Telemedicine visit for: Establish Care PMH of Asthma, DM (diabetes mellitus), type 2 ([place]), Essential hypertension (03/01/2017), GERD (gastroesophageal reflux disease), Gout, and Sleep apnea.  Sleep Apnea She had a sleep study but states she was not instructed on what to do and does not have a CPAP currently.    She is currently homeless. Had to leave her apartment as the apartment complex no longer takes section 8. States she has been homeless since the end of August.    DM TYPE 2 Not well controlled. She has not been monitoring her blood glucose levels. Currently out of her diabetes medications glipizide 5 mg daily, victoza 1.8 mg  daily, metformin 500 mg BID. Will start atorvastatin 20 mg daily. Denies statin intolerance previously.  Lab Results  Component Value Date   HGBA1C 8.2 (H) 01/14/2018   Essential Hypertension Not well controlled. Will restart losartan 25 mg daily and amlodipine 5 mg today. Denies chest pain, shortness of breath, palpitations, lightheadedness, dizziness, headaches or BLE edema.  BP Readings from Last 3 Encounters:  02/10/20 (!) 138/103  11/25/19 (!) 164/111  11/01/19 (!) 179/121   Anxiety and Depression She is requesting to restart her sertraline 50 mg daily. Will start hydroxyzine 25 mg TID prn for anxiety.  Depression screen [facility] 2/9 02/15/2020 01/18/2018 12/18/2016  Decreased Interest 0 0 0  Down, Depressed, Hopeless 0 0 2  PHQ - 2 Score 0 0 2  Altered sleeping - - 2  Tired, decreased energy - - 2  Change in appetite - - 2  Feeling bad or failure about yourself  - - 2  Trouble concentrating - - 2  Moving slowly or fidgety/restless - - 2  Suicidal thoughts - - 0  PHQ-9 Score - - 14   GAD 7 : Generalized Anxiety Score 12/18/2016  Nervous, Anxious, on Edge 2  Control/stop worrying 2  Worry too much - different things 2  Trouble relaxing 2  Restless 2  Easily annoyed or irritable 2  Afraid - awful might happen 2  Total GAD 7 Score 14     Past Medical History:  Diagnosis Date   Asthma    DM (diabetes mellitus), type 2 ([place])    Essential hypertension 03/01/2017   GERD (gastroesophageal reflux disease)  Gout    Hypertension    Nausea and vomiting during pregnancy prior to [redacted] weeks gestation 01/14/2018   Sleep apnea     Past Surgical History:  Procedure Laterality Date   NO PAST SURGERIES      Family History  Adopted: Yes  Problem Relation Age of Onset   Diabetes Neg Hx     Social History   Socioeconomic History   Marital status: Significant Other    Spouse name: Not on file   Number of children: 2   Years of education: High School   Highest  education level: 12th grade  Occupational History   Not on file  Tobacco Use   Smoking status: Never Smoker   Smokeless tobacco: Never Used  Vaping Use   Vaping Use: Never used  Substance and Sexual Activity   Alcohol use: No   Drug use: No   Sexual activity: Not Currently    Birth control/protection: None  Other Topics Concern   Not on file  Social History Narrative   Not on file   Social Determinants of Health   Financial Resource Strain:    Difficulty of Paying Living Expenses: Not on file  Food Insecurity:    Worried About Charity fundraiser in the Last Year: Not on file   [employer] of Food in the Last Year: Not on file  Transportation Needs:    Lack of Transportation (Medical): Not on file   Lack of Transportation (Non-Medical): Not on file  Physical Activity:    Days of Exercise per Week: Not on file   Minutes of Exercise per Session: Not on file  Stress:    Feeling of Stress : Not on file  Social Connections:    Frequency of Communication with Friends and Family: Not on file   Frequency of Social Gatherings with Friends and Family: Not on file   Attends Religious Services: Not on file   Active Member of Clubs or Organizations: Not on file   Attends Archivist Meetings: Not on file   Marital Status: Not on file     Observations/Objective: Awake, alert and oriented x 3   Review of Systems  Constitutional: Negative for fever, malaise/fatigue and weight loss.  HENT: Negative.  Negative for nosebleeds.   Eyes: Negative.  Negative for blurred vision, double vision and photophobia.  Respiratory: Negative.  Negative for cough and shortness of breath.   Cardiovascular: Negative.  Negative for chest pain, palpitations and leg swelling.  Gastrointestinal: Negative.  Negative for heartburn, nausea and vomiting.  Musculoskeletal: Positive for joint pain (left ankle. History of gout). Negative for myalgias.  Neurological: Negative.   Negative for dizziness, focal weakness, seizures and headaches.  Psychiatric/Behavioral: Positive for depression. Negative for suicidal ideas. The patient is nervous/anxious.     Assessment and Plan: Kathryn Shelton was seen today for establish care.  Diagnoses and all orders for this visit:  Encounter to establish care  Uncontrolled type 2 diabetes mellitus with hyperglycemia ([place]) -     Hemoglobin A1c; Future -     Microalbumin / creatinine urine ratio; Future -     Ambulatory referral to Ophthalmology -     liraglutide (VICTOZA) 18 MG/3ML SOPN; SubQ:  Inject 0.6 mg once daily into the skin. Week 2: increase to 1.2 mg once daily; week 3: increase to 1.8 mg once daily -     glipiZIDE (GLUCOTROL) 5 MG tablet; Take 1 tablet (5 mg total) by mouth daily before breakfast. -  Insulin Pen Needle (B-D UF III MINI PEN NEEDLES) 31G X 5 MM MISC; Use as instructed. Inject into the skin once daily. e11.65 -     metFORMIN (GLUCOPHAGE) 500 MG tablet; Take 1 tablet (500 mg total) by mouth 2 (two) times daily with a meal. Continue blood sugar control as discussed in office today, low carbohydrate diet, and regular physical exercise as tolerated, 150 minutes per week (30 min each day, 5 days per week, or 50 min 3 days per week). Keep blood sugar logs with fasting goal of 90-130 mg/dl, post prandial (after you eat) less than 180.  For Hypoglycemia: BS <60 and Hyperglycemia BS >400; contact the clinic ASAP. Annual eye exams and foot exams are recommended.   Essential hypertension -     CMP14+EGFR; Future -     Blood Pressure Monitor DEVI; Please provide patient with insurance approved blood pressure monitor I10.0 She will pick up when ready. Please call. -     amLODipine (NORVASC) 5 MG tablet; Take 1 tablet (5 mg total) by mouth daily. -     losartan (COZAAR) 25 MG tablet; Take 1 tablet (25 mg total) by mouth daily. Continue all antihypertensives as prescribed.  Remember to bring in your blood pressure log with  you for your follow up appointment.  DASH/Mediterranean Diets are healthier choices for HTN.    Acute gout of left foot, unspecified cause -     Uric Acid; Future  Anxiety and depression -     hydrOXYzine (ATARAX/VISTARIL) 25 MG tablet; Take 1 tablet (25 mg total) by mouth every 8 (eight) hours as needed for anxiety. -     sertraline (ZOLOFT) 50 MG tablet; Take 1 tablet (50 mg total) by mouth at bedtime.  Mixed hyperlipidemia -     Lipid panel; Future -     atorvastatin (LIPITOR) 20 MG tablet; Take 1 tablet (20 mg total) by mouth daily. INSTRUCTIONS: Work on a low fat, heart healthy diet and participate in regular aerobic exercise program by working out at least 150 minutes per week; 5 days a week-30 minutes per day. Avoid red meat/beef/steak,  fried foods. junk foods, sodas, sugary drinks, unhealthy snacking, alcohol and smoking.  Drink at least 80 oz of water per day and monitor your carbohydrate intake daily.    Need for hepatitis C screening test -     Hepatitis C Antibody; Future  OSA (obstructive sleep apnea) -     Split night study; Future -     albuterol (VENTOLIN HFA) 108 (90 Base) MCG/ACT inhaler; Inhale 1-2 puffs into the lungs every 6 (six) hours as needed for wheezing or shortness of breath.     Follow Up Instructions Return in about 3 months (around 05/16/2020).     I discussed the assessment and treatment plan with the patient. The patient was provided an opportunity to ask questions and all were answered. The patient agreed with the plan and demonstrated an understanding of the instructions.   The patient was advised to call back or seek an in-person evaluation if the symptoms worsen or if the condition fails to improve as anticipated.  I provided 23 minutes of non-face-to-face time during this encounter including median intraservice time, reviewing previous notes, labs, imaging, medications and explaining diagnosis and management.  Gildardo Pounds, FNP-BC

## 2020-02-16 ENCOUNTER — Other Ambulatory Visit: Payer: Medicaid Other

## 2020-02-20 ENCOUNTER — Encounter: Payer: Self-pay | Admitting: Nurse Practitioner

## 2020-02-29 ENCOUNTER — Other Ambulatory Visit: Payer: Self-pay

## 2020-02-29 ENCOUNTER — Ambulatory Visit: Payer: Medicaid Other | Attending: Nurse Practitioner

## 2020-02-29 DIAGNOSIS — E1165 Type 2 diabetes mellitus with hyperglycemia: Secondary | ICD-10-CM

## 2020-02-29 DIAGNOSIS — I10 Essential (primary) hypertension: Secondary | ICD-10-CM

## 2020-02-29 DIAGNOSIS — E782 Mixed hyperlipidemia: Secondary | ICD-10-CM

## 2020-02-29 DIAGNOSIS — Z1159 Encounter for screening for other viral diseases: Secondary | ICD-10-CM

## 2020-02-29 DIAGNOSIS — M109 Gout, unspecified: Secondary | ICD-10-CM

## 2020-03-04 LAB — CMP14+EGFR
ALT: 20 [IU]/L (ref 0–32)
AST: 16 [IU]/L (ref 0–40)
Albumin/Globulin Ratio: 1.3 (ref 1.2–2.2)
Albumin: 3.7 g/dL — ABNORMAL LOW (ref 3.8–4.8)
Alkaline Phosphatase: 127 [IU]/L — ABNORMAL HIGH (ref 44–121)
BUN/Creatinine Ratio: 23 (ref 9–23)
BUN: 19 mg/dL (ref 6–20)
Bilirubin Total: 0.2 mg/dL (ref 0.0–1.2)
CO2: 22 mmol/L (ref 20–29)
Calcium: 9.4 mg/dL (ref 8.7–10.2)
Chloride: 97 mmol/L (ref 96–106)
Creatinine, Ser: 0.84 mg/dL (ref 0.57–1.00)
GFR calc Af Amer: 107 mL/min/{1.73_m2}
GFR calc non Af Amer: 93 mL/min/{1.73_m2}
Globulin, Total: 2.8 g/dL (ref 1.5–4.5)
Glucose: 546 mg/dL (ref 65–99)
Potassium: 4.6 mmol/L (ref 3.5–5.2)
Sodium: 132 mmol/L — ABNORMAL LOW (ref 134–144)
Total Protein: 6.5 g/dL (ref 6.0–8.5)

## 2020-03-04 LAB — LIPID PANEL
Chol/HDL Ratio: 5.3 ratio — ABNORMAL HIGH (ref 0.0–4.4)
Cholesterol, Total: 276 mg/dL — ABNORMAL HIGH (ref 100–199)
HDL: 52 mg/dL
LDL Chol Calc (NIH): 177 mg/dL — ABNORMAL HIGH (ref 0–99)
Triglycerides: 248 mg/dL — ABNORMAL HIGH (ref 0–149)
VLDL Cholesterol Cal: 47 mg/dL — ABNORMAL HIGH (ref 5–40)

## 2020-03-04 LAB — HEPATITIS C ANTIBODY: Hep C Virus Ab: <0.1 s/co ratio (ref 0.0–0.9)

## 2020-03-04 LAB — MICROALBUMIN / CREATININE URINE RATIO
Creatinine, Urine: 28.9 mg/dL
Microalb/Creat Ratio: 1090 mg/g{creat} — ABNORMAL HIGH (ref 0–29)
Microalbumin, Urine: 315.1 ug/mL

## 2020-03-04 LAB — HEMOGLOBIN A1C
Est. average glucose Bld gHb Est-mCnc: 346 mg/dL
Hgb A1c MFr Bld: 13.7 % — ABNORMAL HIGH (ref 4.8–5.6)

## 2020-03-04 LAB — URIC ACID: Uric Acid: 6.8 mg/dL — ABNORMAL HIGH (ref 2.6–6.2)

## 2020-03-05 ENCOUNTER — Other Ambulatory Visit: Payer: Self-pay | Admitting: Nurse Practitioner

## 2020-03-05 DIAGNOSIS — O24119 Pre-existing diabetes mellitus, type 2, in pregnancy, unspecified trimester: Secondary | ICD-10-CM

## 2020-03-05 MED ORDER — NAPROXEN 500 MG PO TABS: 500.0000 mg | ORAL_TABLET | Freq: Two times a day (BID) | ORAL | 0 refills | Status: AC

## 2020-03-05 MED ORDER — ACCU-CHEK GUIDE VI STRP: ORAL_STRIP | 2 refills | Status: AC

## 2020-03-05 MED ORDER — ACCU-CHEK SOFTCLIX LANCETS MISC: 3 refills | Status: AC

## 2020-03-05 MED ORDER — ACCU-CHEK GUIDE W/DEVICE KIT
1.0000 | PACK | Freq: Four times a day (QID) | 0 refills | Status: AC
Start: 2020-03-05 — End: ?

## 2020-03-27 ENCOUNTER — Encounter (HOSPITAL_BASED_OUTPATIENT_CLINIC_OR_DEPARTMENT_OTHER): Payer: Medicaid Other | Admitting: Internal Medicine

## 2020-04-17 ENCOUNTER — Ambulatory Visit: Payer: Medicaid Other | Admitting: Nurse Practitioner

## 2020-05-02 ENCOUNTER — Ambulatory Visit (HOSPITAL_BASED_OUTPATIENT_CLINIC_OR_DEPARTMENT_OTHER): Payer: Medicaid Other | Attending: Nurse Practitioner | Admitting: Internal Medicine

## 2020-05-02 ENCOUNTER — Other Ambulatory Visit: Payer: Self-pay

## 2020-05-02 VITALS — Ht 66.0 in | Wt 230.0 lb

## 2020-05-02 DIAGNOSIS — G4733 Obstructive sleep apnea (adult) (pediatric): Secondary | ICD-10-CM | POA: Diagnosis present

## 2020-05-13 ENCOUNTER — Other Ambulatory Visit: Payer: Self-pay | Admitting: Nurse Practitioner

## 2020-05-13 DIAGNOSIS — G4733 Obstructive sleep apnea (adult) (pediatric): Secondary | ICD-10-CM | POA: Diagnosis not present

## 2020-05-13 MED ORDER — MISC. DEVICES MISC: 0 refills | Status: AC

## 2020-05-13 NOTE — Procedures (Signed)
Patient Name: Kathryn Shelton, Kathryn Shelton Date: 05/02/2020 Gender: Female D.O.B: 01-29-1991 Age (years): 29 Referring Provider: Gildardo Pounds NP Height (inches): 33 Interpreting Physician: Baird Lyons MD, ABSM Weight (lbs): 230 RPSGT: Carolin Coy BMI: 37 MRN: [id number] Neck Size: 15.00  CLINICAL INFORMATION Sleep Study Type: Split Night CPAP Indication for sleep study: Diabetes, Excessive Daytime Sleepiness, Obesity, Snoring Epworth Sleepiness Score: 7  SLEEP STUDY TECHNIQUE As per the AASM Manual for the Scoring of Sleep and Associated Events v2.3 (April 2016) with a hypopnea requiring 4% desaturations.  The channels recorded and monitored were frontal, central and occipital EEG, electrooculogram (EOG), submentalis EMG (chin), nasal and oral airflow, thoracic and abdominal wall motion, anterior tibialis EMG, snore microphone, electrocardiogram, and pulse oximetry. Continuous positive airway pressure (CPAP) was initiated when the patient met split night criteria and was titrated according to treat sleep-disordered breathing.  MEDICATIONS Medications self-administered by patient taken the night of the study : none reported  RESPIRATORY PARAMETERS Diagnostic  Total AHI (/hr): 67.9 RDI (/hr): 75.5 OA Index (/hr): 4 CA Index (/hr): 0.0 REM AHI (/hr): 84.5 NREM AHI (/hr): 63.4 Supine AHI (/hr): 133.0 Non-supine AHI (/hr): 59.7 Min O2 Sat (%): 74.00 Mean O2 (%): 92.11 Time below 88% (min): 25.2   Titration  Optimal Pressure (cm): 20 AHI at Optimal Pressure (/hr): 0.0 Min O2 at Optimal Pressure (%): 94.0 Supine % at Optimal (%): 0 Sleep % at Optimal (%): 100   SLEEP ARCHITECTURE The recording time for the entire night was 411.4 minutes.  During a baseline period of 175.0 minutes, the patient slept for 165.2 minutes in REM and nonREM, yielding a sleep efficiency of 94.4%. Sleep onset after lights out was 0.3 minutes with a REM latency of 29.0 minutes. The patient spent 6.96% of  the night in stage N1 sleep, 71.55% in stage N2 sleep, 0.00% in stage N3 and 21.5% in REM.  During the titration period of 233.5 minutes, the patient slept for 191.0 minutes in REM and nonREM, yielding a sleep efficiency of 81.8%. Sleep onset after CPAP initiation was 18.9 minutes with a REM latency of 40.0 minutes. The patient spent 6.81% of the night in stage N1 sleep, 51.83% in stage N2 sleep, 0.00% in stage N3 and 41.4% in REM.  CARDIAC DATA The 2 lead EKG demonstrated sinus rhythm. The mean heart rate was 96.50 beats per minute. Other EKG findings include: None.  LEG MOVEMENT DATA The total Periodic Limb Movements of Sleep (PLMS) were 0. The PLMS index was 0.00 .  IMPRESSIONS - Severe obstructive sleep apnea occurred during the diagnostic portion of the study (AHI = 67.9/hour). An optimal PAP pressure was selected for this patient ( 20 cm of water) - No significant central sleep apnea occurred during the diagnostic portion of the study (CAI = 0.0/hour). - Moderate oxygen desaturation was noted during the diagnostic portion of the study (Min O2 =74.00%).  Min O2 sat on CPAP 20 was 94%. - The patient snored with loud snoring volume during the diagnostic portion of the study. - No cardiac abnormalities were noted during this study. - Clinically significant periodic limb movements did not occur during sleep.  DIAGNOSIS - Obstructive Sleep Apnea (G47.33)  RECOMMENDATIONS - Trial of CPAP therapy on autopap 10-20. - Patient used a Medium size Resmed Full Face Mask AirFit F30 mask and heated humidification. - Be careful with alcohol, sedatives and other CNS depressants that may worsen sleep apnea and disrupt normal sleep architecture. - Sleep hygiene should be reviewed  to assess factors that may improve sleep quality. - Weight management and regular exercise should be initiated or continued.  [Electronically signed] 05/13/2020 01:04 PM  Baird Lyons MD, [place], American Board of  Sleep Medicine   NPI: [id number]                          [place], [place] [facility]  ELECTRONICALLY SIGNED ON:  05/13/2020, 1:01 PM [place] PH: [phone]   FX: [phone] [place]

## 2020-05-17 ENCOUNTER — Encounter (HOSPITAL_COMMUNITY): Payer: Self-pay | Admitting: Emergency Medicine

## 2020-05-17 ENCOUNTER — Ambulatory Visit (HOSPITAL_COMMUNITY)
Admission: EM | Admit: 2020-05-17 | Discharge: 2020-05-17 | Disposition: A | Discharge status: Home / Self Care | Payer: Medicaid Other | Attending: Family Medicine | Admitting: Family Medicine

## 2020-05-17 ENCOUNTER — Other Ambulatory Visit: Payer: Self-pay

## 2020-05-17 DIAGNOSIS — M79674 Pain in right toe(s): Secondary | ICD-10-CM | POA: Insufficient documentation

## 2020-05-17 LAB — COMPREHENSIVE METABOLIC PANEL
ALT: 19 U/L (ref 0–44)
AST: 19 U/L (ref 15–41)
Albumin: 3 g/dL — ABNORMAL LOW (ref 3.5–5.0)
Alkaline Phosphatase: 101 U/L (ref 38–126)
Anion gap: 9 (ref 5–15)
BUN: 9 mg/dL (ref 6–20)
CO2: 28 mmol/L (ref 22–32)
Calcium: 8.9 mg/dL (ref 8.9–10.3)
Chloride: 97 mmol/L — ABNORMAL LOW (ref 98–111)
Creatinine, Ser: 0.9 mg/dL (ref 0.44–1.00)
GFR, Estimated: >60 mL/min (ref >60)
Glucose, Bld: 404 mg/dL — ABNORMAL HIGH (ref 70–99)
Potassium: 4.3 mmol/L (ref 3.5–5.1)
Sodium: 134 mmol/L — ABNORMAL LOW (ref 135–145)
Total Bilirubin: 0.4 mg/dL (ref 0.3–1.2)
Total Protein: 6.5 g/dL (ref 6.5–8.1)

## 2020-05-17 LAB — URIC ACID: Uric Acid, Serum: 7.3 mg/dL — ABNORMAL HIGH (ref 2.5–7.1)

## 2020-05-17 MED ORDER — KETOROLAC TROMETHAMINE 60 MG/2ML IM SOLN
60.0000 mg | Freq: Once | INTRAMUSCULAR | Status: AC
  Administered 2020-05-17: 16:00:00 60 mg via INTRAMUSCULAR

## 2020-05-17 MED ORDER — KETOROLAC TROMETHAMINE 60 MG/2ML IM SOLN
INTRAMUSCULAR | Status: AC
  Filled 2020-05-17: qty 2

## 2020-05-17 MED ORDER — COLCHICINE 0.6 MG PO TABS: ORAL_TABLET | ORAL | 0 refills | Status: DC

## 2020-05-17 NOTE — Discharge Instructions (Signed)
Take ibuprofen and tylenol as needed for pain

## 2020-05-17 NOTE — ED Provider Notes (Signed)
[place]    CSN: [id number] Arrival date & time: 05/17/20  1338      History   Chief Complaint Chief Complaint  Patient presents with  . Foot Pain    right    HPI Kathryn Shelton is a 29 y.o. female.   Here today with about a week of severe right great toe pain, swelling and redness. Denies any injury to area, rashes or lesions, fever, chills, history of similar issues, new activities. Not trying anything for sxs so far. No known hx of joint issues, hx of HTN, poorly controlled DM, and per chart review has had elevated uric acid in the past per PCP labs.      Past Medical History:  Diagnosis Date  . Asthma   . DM (diabetes mellitus), type 2 ([place])   . Essential hypertension 03/01/2017  . GERD (gastroesophageal reflux disease)   . Gout   . Hypertension   . Nausea and vomiting during pregnancy prior to [redacted] weeks gestation 01/14/2018  . Sleep apnea     Patient Active Problem List   Diagnosis Date Noted  . Oligohydramnios antepartum, second trimester, other fetus 03/09/2018  . Obesity, morbid ([place]) 03/09/2018  . Preterm premature rupture of membranes, unspecified as to length of time between rupture and onset of labor, second trimester 03/09/2018  . Supervision of high risk pregnancy, antepartum 12/31/2017  . Chronic hypertension during pregnancy, antepartum 12/31/2017  . Pre-existing type 2 diabetes mellitus during pregnancy, antepartum 12/31/2017  . Mixed hyperlipidemia 12/02/2017  . Type II diabetes mellitus, uncontrolled ([place]) 03/01/2017  . Essential hypertension 03/01/2017  . Intermittent palpitations 03/01/2017  . Panic attack 03/01/2017  . Obstructive sleep apnea syndrome 03/01/2017    Past Surgical History:  Procedure Laterality Date  . NO PAST SURGERIES      OB History    Gravida  3   Para  3   Term  2   Preterm  1   AB  0   Living  3     SAB  0   IAB  0   Ectopic  0   Multiple  0   Live Births  3            Home  Medications    Prior to Admission medications   Medication Sig Start Date End Date Taking? Authorizing Provider  Accu-Chek Softclix Lancets lancets Use as instructed. Inject into the skin three times daily E11.65 03/05/20   Gildardo Pounds, NP  albuterol (VENTOLIN HFA) 108 (90 Base) MCG/ACT inhaler Inhale 1-2 puffs into the lungs every 6 (six) hours as needed for wheezing or shortness of breath. 02/15/20   Gildardo Pounds, NP  amLODipine (NORVASC) 5 MG tablet Take 1 tablet (5 mg total) by mouth daily. 02/15/20 05/15/20  Gildardo Pounds, NP  atorvastatin (LIPITOR) 20 MG tablet Take 1 tablet (20 mg total) by mouth daily. 02/15/20 05/15/20  Gildardo Pounds, NP  Blood Glucose Monitoring Suppl (ACCU-CHEK GUIDE) w/Device KIT 1 Device by Does not apply route 4 (four) times daily. 03/05/20   Gildardo Pounds, NP  Blood Pressure Monitor DEVI Please provide patient with insurance approved blood pressure monitor I10.0 She will pick up when ready. Please call. 02/15/20   Gildardo Pounds, NP  colchicine 0.6 MG tablet Take 2 tabs at onset of gout flare, then may repeat 1 tab in an hour if pain persists. Max of 2 tabs daily after 05/17/20   Volney American, PA-C  glipiZIDE (GLUCOTROL) 5 MG tablet Take 1 tablet (5 mg total) by mouth daily before breakfast. 02/15/20 05/15/20  Gildardo Pounds, NP  glucose blood (ACCU-CHEK GUIDE) test strip Use as instructed. Inject into the skin three times daily E11.65 03/05/20   Gildardo Pounds, NP  Insulin Pen Needle (B-D UF III MINI PEN NEEDLES) 31G X 5 MM MISC Use as instructed. Inject into the skin once daily. e11.65 02/15/20   Gildardo Pounds, NP  liraglutide (VICTOZA) 18 MG/3ML SOPN SubQ:  Inject 0.6 mg once daily into the skin. Week 2: increase to 1.2 mg once daily; week 3: increase to 1.8 mg once daily 02/15/20   Gildardo Pounds, NP  losartan (COZAAR) 25 MG tablet Take 1 tablet (25 mg total) by mouth daily. 02/15/20 05/15/20  Gildardo Pounds, NP  metFORMIN  (GLUCOPHAGE) 500 MG tablet Take 1 tablet (500 mg total) by mouth 2 (two) times daily with a meal. 02/15/20 05/15/20  Gildardo Pounds, NP  Misc. Devices MISC Please provide patient with insurance approval for CPAP along with the following settings: autopap 10-20. Medium size Resmed Full Face Mask AirFit F30 mask and heated humidification. 05/13/20   Gildardo Pounds, NP  sertraline (ZOLOFT) 50 MG tablet Take 1 tablet (50 mg total) by mouth at bedtime. 02/15/20 05/15/20  Gildardo Pounds, NP    Family History Family History  Adopted: Yes  Problem Relation Age of Onset  . Diabetes Neg Hx     Social History Social History   Tobacco Use  . Smoking status: Never Smoker  . Smokeless tobacco: Never Used  Vaping Use  . Vaping Use: Never used  Substance Use Topics  . Alcohol use: No  . Drug use: No     Allergies   Shellfish-derived products and Sulfa antibiotics   Review of Systems Review of Systems PER HPI    Physical Exam Triage Vital Signs ED Triage Vitals  Enc Vitals Group     BP 05/17/20 1450 133/87     Pulse Rate 05/17/20 1450 96     Resp 05/17/20 1450 20     Temp 05/17/20 1450 (!) 97 F (36.1 C)     Temp Source 05/17/20 1450 Oral     SpO2 05/17/20 1450 100 %     Weight --      Height --      Head Circumference --      Peak Flow --      Pain Score 05/17/20 1447 9     Pain Loc --      Pain Edu? --      Excl. in [place]? --    No data found.  Updated Vital Signs BP 133/87 (BP Location: Right Arm)   Pulse 96   Temp (!) 97 F (36.1 C) (Oral)   Resp 20   LMP 05/13/2020   SpO2 100%   Visual Acuity Right Eye Distance:   Left Eye Distance:   Bilateral Distance:    Right Eye Near:   Left Eye Near:    Bilateral Near:     Physical Exam Vitals and nursing note reviewed.  Constitutional:      Appearance: Normal appearance. She is not ill-appearing.  HENT:     Head: Atraumatic.  Eyes:     Extraocular Movements: Extraocular movements intact.      Conjunctiva/sclera: Conjunctivae normal.  Cardiovascular:     Rate and Rhythm: Normal rate and regular rhythm.     Heart sounds: Normal  heart sounds.  Pulmonary:     Effort: Pulmonary effort is normal.     Breath sounds: Normal breath sounds.  Musculoskeletal:        General: Tenderness (MTP of right great toe edematous, erythematous and severely ttp) present.     Cervical back: Normal range of motion and neck supple.     Comments: Poor ROM in right great toe due to pain  Skin:    General: Skin is warm and dry.     Findings: Erythema present.  Neurological:     Mental Status: She is alert and oriented to person, place, and time.     Comments: B/l LEs neurovascularly intact  Psychiatric:        Mood and Affect: Mood normal.        Thought Content: Thought content normal.        Judgment: Judgment normal.      UC Treatments / Results  Labs (all labs ordered are listed, but only abnormal results are displayed) Labs Reviewed  COMPREHENSIVE METABOLIC PANEL  URIC ACID    EKG   Radiology No results found.  Procedures Procedures (including critical care time)  Medications Ordered in UC Medications  ketorolac (TORADOL) injection 60 mg (60 mg Intramuscular Given 05/17/20 1601)    Initial Impression / Assessment and Plan / UC Course  I have reviewed the triage vital signs and the nursing notes.  Pertinent labs & imaging results that were available during my care of the patient were reviewed by me and considered in my medical decision making (see chart for details).     Strong suspicion for gout, will give IM toradol in clinic and treat with ibuprofen prn, colchicine, rest, heat. Discussed diet changes, close PCP f/u. Labs pending.   Final Clinical Impressions(s) / UC Diagnoses   Final diagnoses:  Pain of toe of right foot     Discharge Instructions     Take ibuprofen and tylenol as needed for pain    ED Prescriptions    Medication Sig Dispense Auth. Provider    colchicine 0.6 MG tablet Take 2 tabs at onset of gout flare, then may repeat 1 tab in an hour if pain persists. Max of 2 tabs daily after 10 tablet Volney American, [place]     PDMP not reviewed this encounter.   Volney American, [place] 05/17/20 1624

## 2020-05-17 NOTE — ED Triage Notes (Signed)
Pt c/o of pain to right big toe, denies injury.

## 2020-05-31 ENCOUNTER — Ambulatory Visit (HOSPITAL_COMMUNITY)
Admission: EM | Admit: 2020-05-31 | Discharge: 2020-05-31 | Disposition: A | Discharge status: Home / Self Care | Payer: Medicaid Other | Attending: Family Medicine | Admitting: Family Medicine

## 2020-05-31 ENCOUNTER — Other Ambulatory Visit: Payer: Self-pay

## 2020-05-31 DIAGNOSIS — J069 Acute upper respiratory infection, unspecified: Secondary | ICD-10-CM | POA: Diagnosis not present

## 2020-05-31 DIAGNOSIS — U071 COVID-19: Secondary | ICD-10-CM | POA: Insufficient documentation

## 2020-05-31 LAB — POCT URINALYSIS DIPSTICK, ED / UC
Bilirubin Urine: NEGATIVE
Glucose, UA: 500 mg/dL — AB
Ketones, ur: NEGATIVE mg/dL
Nitrite: NEGATIVE
Protein, ur: 100 mg/dL — AB
Specific Gravity, Urine: 1.01 (ref 1.005–1.030)
Urobilinogen, UA: 0.2 mg/dL (ref 0.0–1.0)
pH: 5 (ref 5.0–8.0)

## 2020-05-31 LAB — POC URINE PREG, ED: Preg Test, Ur: NEGATIVE

## 2020-05-31 MED ORDER — PROMETHAZINE-DM 6.25-15 MG/5ML PO SYRP: 5.0000 mL | ORAL_SOLUTION | Freq: Four times a day (QID) | ORAL | 0 refills | Status: DC | PRN

## 2020-05-31 NOTE — ED Provider Notes (Signed)
[place]    CSN: [id number] Arrival date & time: 05/31/20  1618      History   Chief Complaint Chief Complaint  Patient presents with  . URI    HPI Kathryn Shelton is a 30 y.o. female.   Here today with 3 days of cough, chills, runny nose, fatigue. Denies CP, SOB, abdominal pain, N/V. Taking theraflu without relief. Daughter sick with similar sxs. Complicated medical hx significant for asthma, DM, HTN, OSA, obesity. Also wanting preg test while here. LMP 05/13/2020.      Past Medical History:  Diagnosis Date  . Asthma   . DM (diabetes mellitus), type 2 ([place])   . Essential hypertension 03/01/2017  . GERD (gastroesophageal reflux disease)   . Gout   . Hypertension   . Nausea and vomiting during pregnancy prior to [redacted] weeks gestation 01/14/2018  . Sleep apnea     Patient Active Problem List   Diagnosis Date Noted  . Oligohydramnios antepartum, second trimester, other fetus 03/09/2018  . Obesity, morbid ([place]) 03/09/2018  . Preterm premature rupture of membranes, unspecified as to length of time between rupture and onset of labor, second trimester 03/09/2018  . Supervision of high risk pregnancy, antepartum 12/31/2017  . Chronic hypertension during pregnancy, antepartum 12/31/2017  . Pre-existing type 2 diabetes mellitus during pregnancy, antepartum 12/31/2017  . Mixed hyperlipidemia 12/02/2017  . Type II diabetes mellitus, uncontrolled ([place]) 03/01/2017  . Essential hypertension 03/01/2017  . Intermittent palpitations 03/01/2017  . Panic attack 03/01/2017  . Obstructive sleep apnea syndrome 03/01/2017    Past Surgical History:  Procedure Laterality Date  . NO PAST SURGERIES      OB History    Gravida  3   Para  3   Term  2   Preterm  1   AB  0   Living  3     SAB  0   IAB  0   Ectopic  0   Multiple  0   Live Births  3            Home Medications    Prior to Admission medications   Medication Sig Start Date End Date Taking?  Authorizing Provider  promethazine-dextromethorphan (PROMETHAZINE-DM) 6.25-15 MG/5ML syrup Take 5 mLs by mouth 4 (four) times daily as needed for cough. 05/31/20  Yes Volney American, PA-C  Accu-Chek Softclix Lancets lancets Use as instructed. Inject into the skin three times daily E11.65 03/05/20   Gildardo Pounds, NP  albuterol (VENTOLIN HFA) 108 (90 Base) MCG/ACT inhaler Inhale 1-2 puffs into the lungs every 6 (six) hours as needed for wheezing or shortness of breath. 02/15/20   Gildardo Pounds, NP  amLODipine (NORVASC) 5 MG tablet Take 1 tablet (5 mg total) by mouth daily. 02/15/20 05/15/20  Gildardo Pounds, NP  atorvastatin (LIPITOR) 20 MG tablet Take 1 tablet (20 mg total) by mouth daily. 02/15/20 05/15/20  Gildardo Pounds, NP  Blood Glucose Monitoring Suppl (ACCU-CHEK GUIDE) w/Device KIT 1 Device by Does not apply route 4 (four) times daily. 03/05/20   Gildardo Pounds, NP  Blood Pressure Monitor DEVI Please provide patient with insurance approved blood pressure monitor I10.0 She will pick up when ready. Please call. 02/15/20   Gildardo Pounds, NP  colchicine 0.6 MG tablet Take 2 tabs at onset of gout flare, then may repeat 1 tab in an hour if pain persists. Max of 2 tabs daily after 05/17/20   Volney American, PA-C  glipiZIDE (GLUCOTROL) 5 MG tablet Take 1 tablet (5 mg total) by mouth daily before breakfast. 02/15/20 05/15/20  Gildardo Pounds, NP  glucose blood (ACCU-CHEK GUIDE) test strip Use as instructed. Inject into the skin three times daily E11.65 03/05/20   Gildardo Pounds, NP  Insulin Pen Needle (B-D UF III MINI PEN NEEDLES) 31G X 5 MM MISC Use as instructed. Inject into the skin once daily. e11.65 02/15/20   Gildardo Pounds, NP  liraglutide (VICTOZA) 18 MG/3ML SOPN SubQ:  Inject 0.6 mg once daily into the skin. Week 2: increase to 1.2 mg once daily; week 3: increase to 1.8 mg once daily 02/15/20   Gildardo Pounds, NP  losartan (COZAAR) 25 MG tablet Take 1 tablet (25 mg  total) by mouth daily. 02/15/20 05/15/20  Gildardo Pounds, NP  metFORMIN (GLUCOPHAGE) 500 MG tablet Take 1 tablet (500 mg total) by mouth 2 (two) times daily with a meal. 02/15/20 05/15/20  Gildardo Pounds, NP  Misc. Devices MISC Please provide patient with insurance approval for CPAP along with the following settings: autopap 10-20. Medium size Resmed Full Face Mask AirFit F30 mask and heated humidification. 05/13/20   Gildardo Pounds, NP  sertraline (ZOLOFT) 50 MG tablet Take 1 tablet (50 mg total) by mouth at bedtime. 02/15/20 05/15/20  Gildardo Pounds, NP    Family History Family History  Adopted: Yes  Problem Relation Age of Onset  . Diabetes Neg Hx     Social History Social History   Tobacco Use  . Smoking status: Never Smoker  . Smokeless tobacco: Never Used  Vaping Use  . Vaping Use: Never used  Substance Use Topics  . Alcohol use: No  . Drug use: No     Allergies   Shellfish-derived products and Sulfa antibiotics   Review of Systems Review of Systems PER HPI    Physical Exam Triage Vital Signs ED Triage Vitals  Enc Vitals Group     BP 05/31/20 1725 110/84     Pulse Rate 05/31/20 1725 (!) 101     Resp 05/31/20 1725 16     Temp 05/31/20 1725 98.1 F (36.7 C)     Temp Source 05/31/20 1725 Oral     SpO2 05/31/20 1725 97 %     Weight --      Height --      Head Circumference --      Peak Flow --      Pain Score 05/31/20 1728 0     Pain Loc --      Pain Edu? --      Excl. in [place]? --    No data found.  Updated Vital Signs BP 110/84 (BP Location: Left Arm)   Pulse (!) 101   Temp 98.1 F (36.7 C) (Oral)   Resp 16   LMP 05/13/2020 (LMP Unknown)   SpO2 97%   Visual Acuity Right Eye Distance:   Left Eye Distance:   Bilateral Distance:    Right Eye Near:   Left Eye Near:    Bilateral Near:     Physical Exam Vitals and nursing note reviewed.  Constitutional:      Appearance: Normal appearance. She is not ill-appearing.  HENT:     Head:  Atraumatic.     Right Ear: Tympanic membrane normal.     Left Ear: Tympanic membrane normal.     Nose: Rhinorrhea present.     Mouth/Throat:     Mouth: Mucous membranes  are moist.     Pharynx: Posterior oropharyngeal erythema present.  Eyes:     Extraocular Movements: Extraocular movements intact.     Conjunctiva/sclera: Conjunctivae normal.  Cardiovascular:     Rate and Rhythm: Normal rate and regular rhythm.     Heart sounds: Normal heart sounds.  Pulmonary:     Effort: Pulmonary effort is normal.     Breath sounds: Normal breath sounds.  Abdominal:     General: Bowel sounds are normal. There is no distension.     Palpations: Abdomen is soft.     Tenderness: There is no abdominal tenderness. There is no guarding.  Musculoskeletal:        General: Normal range of motion.     Cervical back: Normal range of motion and neck supple.  Skin:    General: Skin is warm and dry.  Neurological:     Mental Status: She is alert and oriented to person, place, and time.  Psychiatric:        Mood and Affect: Mood normal.        Thought Content: Thought content normal.        Judgment: Judgment normal.      UC Treatments / Results  Labs (all labs ordered are listed, but only abnormal results are displayed) Labs Reviewed  POCT URINALYSIS DIPSTICK, ED / UC - Abnormal; Notable for the following components:      Result Value   Glucose, UA 500 (*)    Hgb urine dipstick SMALL (*)    Protein, ur 100 (*)    Leukocytes,Ua TRACE (*)    All other components within normal limits  SARS CORONAVIRUS 2 (TAT 6-24 HRS)  POC URINE PREG, ED    EKG   Radiology No results found.  Procedures Procedures (including critical care time)  Medications Ordered in UC Medications - No data to display  Initial Impression / Assessment and Plan / UC Course  I have reviewed the triage vital signs and the nursing notes.  Pertinent labs & imaging results that were available during my care of the patient  were reviewed by me and considered in my medical decision making (see chart for details).     Exam and vitals stable, COVID pcr pending, phenergan DM for comfort, supportive home care reviewed. Return for worsening sxs, isolate.   Final Clinical Impressions(s) / UC Diagnoses   Final diagnoses:  Viral URI with cough   Discharge Instructions   None    ED Prescriptions    Medication Sig Dispense Auth. Provider   promethazine-dextromethorphan (PROMETHAZINE-DM) 6.25-15 MG/5ML syrup Take 5 mLs by mouth 4 (four) times daily as needed for cough. 100 mL Volney American, [place]     PDMP not reviewed this encounter.   Volney American, [place] 05/31/20 1812

## 2020-05-31 NOTE — ED Triage Notes (Signed)
Pt c/o cold sx onset 3 days associated w/cough, chills  Denies fevers, vomiting, diarrhea  Would also like to get tested for poss pregnancy.   Taking OTC Thera flu w/no relief.   A&O X4... NAD.Marland Kitchen. ambulatory

## 2020-06-01 LAB — SARS CORONAVIRUS 2 (TAT 6-24 HRS): SARS Coronavirus 2: POSITIVE — AB

## 2020-09-16 IMAGING — CR CHEST - 2 VIEW
2 series · 2 of 2 positions shown · non-contrast
Comparison: Radiograph November 11, 2018

CLINICAL DATA: Cough, hypertension

EXAM:
CHEST - 2 VIEW

[chest pa]
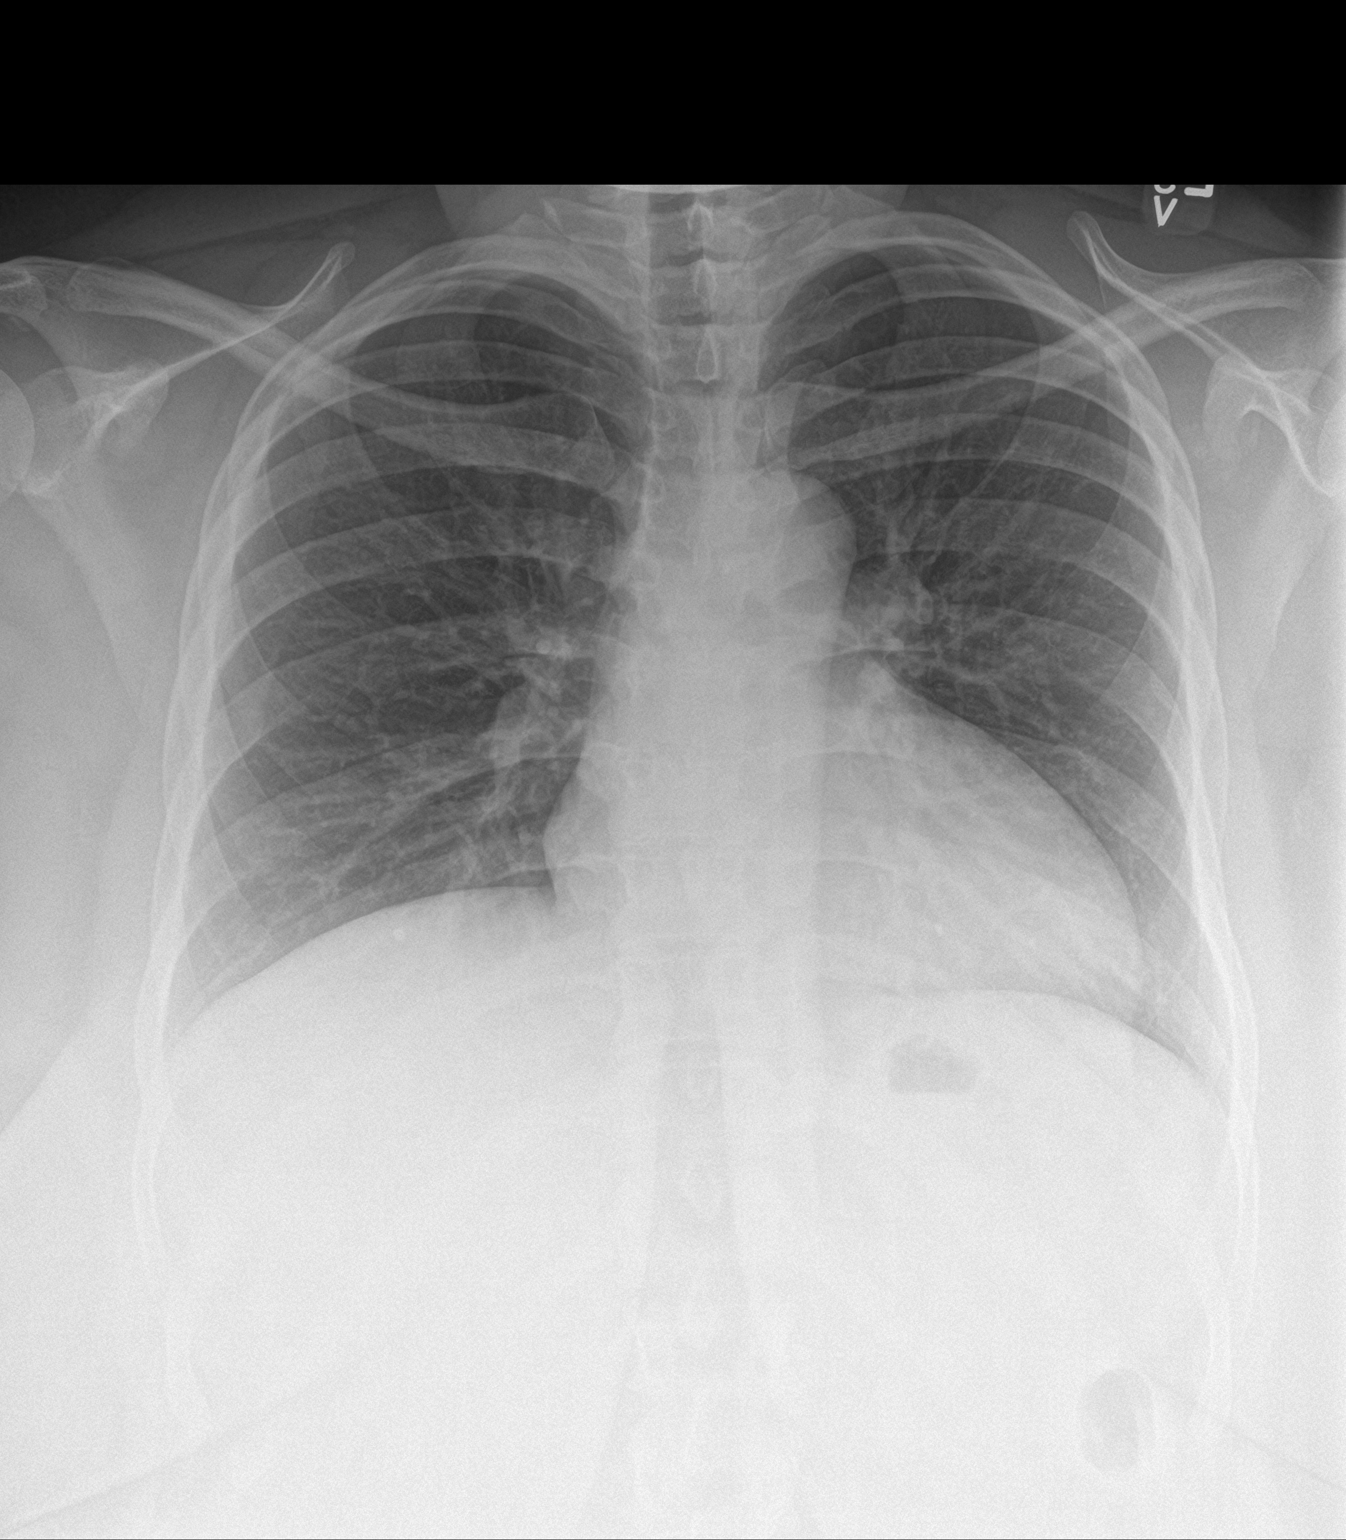

[chest lat]
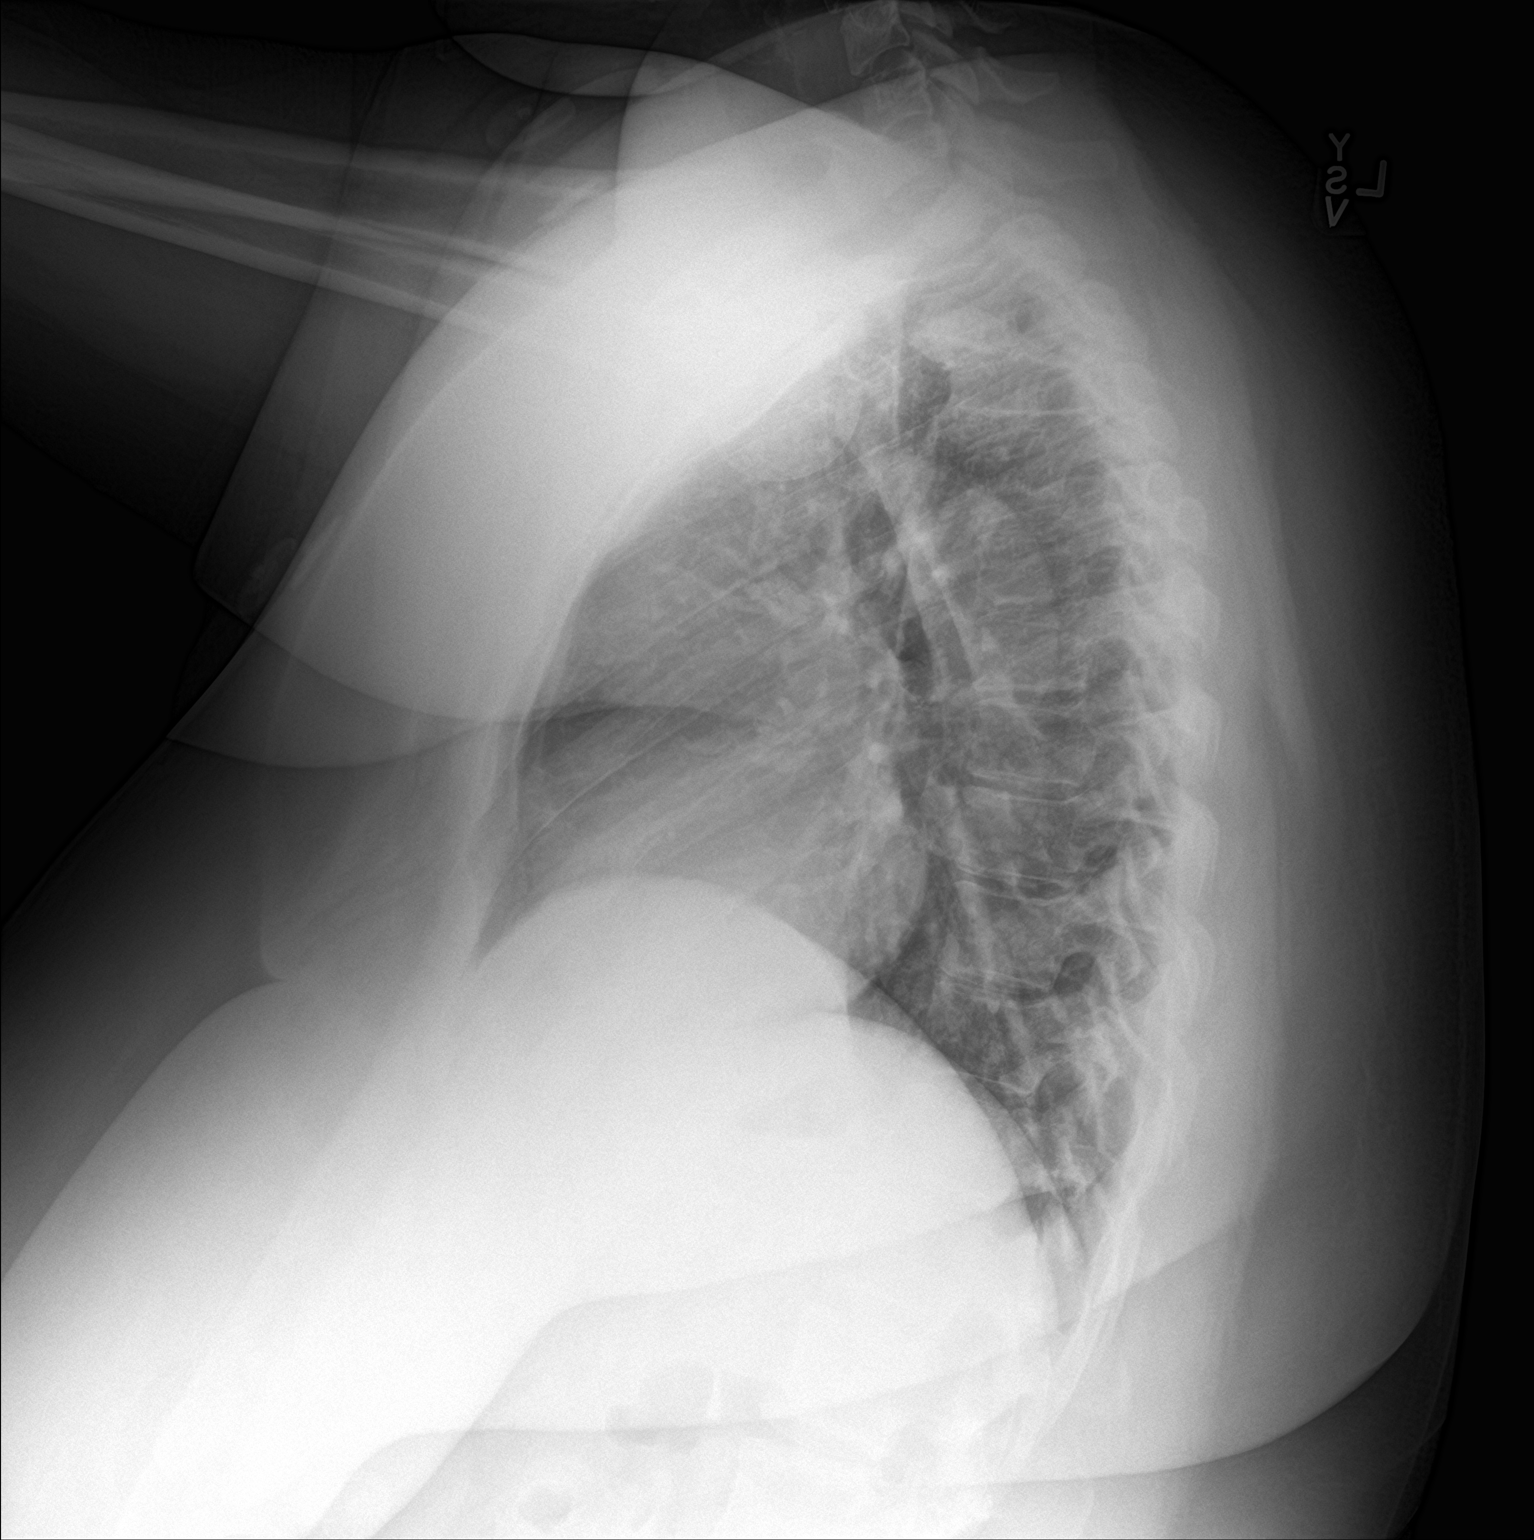

[2 of 2 positions shown; findings below may reference images not displayed]

FINDINGS: No consolidation, features of edema, pneumothorax, or effusion.
Pulmonary vascularity is normally distributed. The cardiomediastinal
contours are unremarkable. No acute osseous or soft tissue
abnormality.
IMPRESSION: No acute cardiopulmonary disease.

## 2021-05-27 ENCOUNTER — Inpatient Hospital Stay (HOSPITAL_COMMUNITY)
Admission: EM | Admit: 2021-05-27 | Discharge: 2021-05-28 | Disposition: A | Discharge status: Home / Self Care | Payer: Medicaid Other | Attending: Obstetrics & Gynecology | Admitting: Obstetrics & Gynecology

## 2021-05-27 ENCOUNTER — Encounter (HOSPITAL_COMMUNITY): Payer: Self-pay | Admitting: Emergency Medicine

## 2021-05-27 ENCOUNTER — Other Ambulatory Visit: Payer: Self-pay

## 2021-05-27 DIAGNOSIS — Z3689 Encounter for other specified antenatal screening: Secondary | ICD-10-CM | POA: Insufficient documentation

## 2021-05-27 DIAGNOSIS — O26893 Other specified pregnancy related conditions, third trimester: Secondary | ICD-10-CM | POA: Insufficient documentation

## 2021-05-27 DIAGNOSIS — R1032 Left lower quadrant pain: Secondary | ICD-10-CM | POA: Insufficient documentation

## 2021-05-27 DIAGNOSIS — Z3A32 32 weeks gestation of pregnancy: Secondary | ICD-10-CM

## 2021-05-27 NOTE — ED Triage Notes (Signed)
Patient is [redacted] weeks pregnant G5P3 , reports LLQ abdominal pain this morning , no rupture of membranes or vaginal bleeding .

## 2021-05-27 NOTE — ED Notes (Signed)
Report given to MAU RN .  

## 2021-05-28 ENCOUNTER — Encounter (HOSPITAL_COMMUNITY): Payer: Self-pay

## 2021-05-28 DIAGNOSIS — O26893 Other specified pregnancy related conditions, third trimester: Secondary | ICD-10-CM

## 2021-05-28 DIAGNOSIS — R109 Unspecified abdominal pain: Secondary | ICD-10-CM | POA: Diagnosis not present

## 2021-05-28 DIAGNOSIS — Z3689 Encounter for other specified antenatal screening: Secondary | ICD-10-CM

## 2021-05-28 DIAGNOSIS — Z3A32 32 weeks gestation of pregnancy: Secondary | ICD-10-CM

## 2021-05-28 DIAGNOSIS — R1032 Left lower quadrant pain: Secondary | ICD-10-CM | POA: Diagnosis not present

## 2021-05-28 LAB — URINALYSIS, ROUTINE W REFLEX MICROSCOPIC
Bacteria, UA: NONE SEEN
Bilirubin Urine: NEGATIVE
Glucose, UA: NEGATIVE mg/dL
Hgb urine dipstick: NEGATIVE
Ketones, ur: NEGATIVE mg/dL
Leukocytes,Ua: NEGATIVE
Nitrite: NEGATIVE
Protein, ur: 100 mg/dL — AB
Specific Gravity, Urine: 1.017 (ref 1.005–1.030)
pH: 5 (ref 5.0–8.0)

## 2021-05-28 LAB — GC/CHLAMYDIA PROBE AMP (~~LOC~~) NOT AT ARMC
Chlamydia: NEGATIVE
Comment: NEGATIVE
Comment: NORMAL
Neisseria Gonorrhea: NEGATIVE

## 2021-05-28 LAB — CBG MONITORING, ED: Glucose-Capillary: 133 mg/dL — ABNORMAL HIGH (ref 70–99)

## 2021-05-28 LAB — WET PREP, GENITAL
Clue Cells Wet Prep HPF POC: NONE SEEN
Sperm: NONE SEEN
Trich, Wet Prep: NONE SEEN
WBC, Wet Prep HPF POC: >=10 — AB (ref <10)
Yeast Wet Prep HPF POC: NONE SEEN

## 2021-05-28 NOTE — ED Provider Notes (Signed)
Emergency Medicine Provider OB Triage Evaluation Note  Kathryn Shelton is a 31 y.o. female, [phone], at Unknown gestation who presents to the emergency department with complaints of LLQ abdominal pain.  She has been at [facility] where she was being admitted for concerning fetal heart tracing according to chart review however left AMA and then came here.  She is still feeling fetal movement.  Her pain is constant.  Pain is in the left lower quadrant.  No vaginal bleeding or spotting.  She does not feel like this is labor, NO FEELING OF NEED TO PUSH.   According to review of outside records she was hypoglycemic at [facility] earlier.  Review of  Systems  Positive: Left sided abdominal pain Negative: Fevers, syncope  Physical Exam  BP 126/90    Pulse 100    Temp 98.8 F (37.1 C) (Oral)    Resp 14    LMP 05/13/2020 (LMP Unknown)    SpO2 97%  General: Awake, no distress  HEENT: Atraumatic  Resp: Normal effort  Cardiac: Normal rate Abd: Nondistended, nontender  MSK: Moves all extremities without difficulty Neuro: Speech clear  Medical Decision Making  Pt evaluated for pregnancy concern and is stable for transfer to MAU. Pt is in agreement with plan for transfer.  12:10 AM Discussed with MAU APP, Melanie who accepts patient in transfer, states we don't need to get fetal heart tones prior to transfer.   Clinical Impression  No diagnosis found.  CBG is checked prior to transfer to MAU, 133.  Patient transferred to MAU.  Note: Portions of this report may have been transcribed using voice recognition software. Every effort was made to ensure accuracy; however, inadvertent computerized transcription errors may be present     Cristina Gong, PA-C 05/28/21 0017    Shon Baton, MD 05/28/21 [phone]

## 2021-05-28 NOTE — MAU Provider Note (Signed)
[place]     CSN: [id number]  Arrival date and time: 05/27/21 2341   Event Date/Time   First Provider Initiated Contact with Patient 05/28/21 0128      Chief Complaint  Patient presents with   [redacted] weeks Pregnant / Abdominal Pain    31 y.o. [id number] _0 .1 wks presenting with LLQ pain. Reports onset 3 nights ago. Describes as intermittent sharp pain in LLQ and LMQ. She thinks it feels a little like ctx. Rates pain 10/10. Denies urinary sx. Denies Vb or LOF. Reports good FM. Had recent BM. During interview pt was noted to uncontrollably close her eyes multiple times and admits to feeling sleepy.   OB History     Gravida  4   Para  3   Term  2   Preterm  1   AB  0   Living  3      SAB  0   IAB  0   Ectopic  0   Multiple  0   Live Births  3           Past Medical History:  Diagnosis Date   Asthma    DM (diabetes mellitus), type 2 ([place])    Essential hypertension 03/01/2017   GERD (gastroesophageal reflux disease)    Gout    Hypertension    Nausea and vomiting during pregnancy prior to [redacted] weeks gestation 01/14/2018   Sleep apnea     Past Surgical History:  Procedure Laterality Date   NO PAST SURGERIES      Family History  Adopted: Yes  Problem Relation Age of Onset   Diabetes Neg Hx     Social History   Tobacco Use   Smoking status: Never   Smokeless tobacco: Never  Vaping Use   Vaping Use: Never used  Substance Use Topics   Alcohol use: No   Drug use: No    Allergies:  Allergies  Allergen Reactions   Shellfish-Derived Products Swelling   Sulfa Antibiotics Cough    Medications Prior to Admission  Medication Sig Dispense Refill Last Dose   Accu-Chek Softclix Lancets lancets Use as instructed. Inject into the skin three times daily E11.65 200 each 3    albuterol (VENTOLIN HFA) 108 (90 Base) MCG/ACT inhaler Inhale 1-2 puffs into the lungs every 6 (six) hours as needed for wheezing or shortness of breath. 1 each 0    amLODipine (NORVASC) 5  MG tablet Take 1 tablet (5 mg total) by mouth daily. 90 tablet 0    atorvastatin (LIPITOR) 20 MG tablet Take 1 tablet (20 mg total) by mouth daily. 90 tablet 1    Blood Glucose Monitoring Suppl (ACCU-CHEK GUIDE) w/Device KIT 1 Device by Does not apply route 4 (four) times daily. 1 kit 0    Blood Pressure Monitor DEVI Please provide patient with insurance approved blood pressure monitor I10.0 She will pick up when ready. Please call. 1 each 0    colchicine 0.6 MG tablet Take 2 tabs at onset of gout flare, then may repeat 1 tab in an hour if pain persists. Max of 2 tabs daily after 10 tablet 0    glipiZIDE (GLUCOTROL) 5 MG tablet Take 1 tablet (5 mg total) by mouth daily before breakfast. 90 tablet 0    glucose blood (ACCU-CHEK GUIDE) test strip Use as instructed. Inject into the skin three times daily E11.65 200 each 2    Insulin Pen Needle (B-D UF III MINI PEN NEEDLES) 31G X 5 MM  MISC Use as instructed. Inject into the skin once daily. e11.65 100 each 3    liraglutide (VICTOZA) 18 MG/3ML SOPN SubQ:  Inject 0.6 mg once daily into the skin. Week 2: increase to 1.2 mg once daily; week 3: increase to 1.8 mg once daily 9 mL 3    losartan (COZAAR) 25 MG tablet Take 1 tablet (25 mg total) by mouth daily. 90 tablet 0    metFORMIN (GLUCOPHAGE) 500 MG tablet Take 1 tablet (500 mg total) by mouth 2 (two) times daily with a meal. 180 tablet 0    Misc. Devices MISC Please provide patient with insurance approval for CPAP along with the following settings: autopap 10-20. Medium size Resmed Full Face Mask AirFit F30 mask and heated humidification. 1 each 0    promethazine-dextromethorphan (PROMETHAZINE-DM) 6.25-15 MG/5ML syrup Take 5 mLs by mouth 4 (four) times daily as needed for cough. 100 mL 0    sertraline (ZOLOFT) 50 MG tablet Take 1 tablet (50 mg total) by mouth at bedtime. 90 tablet 1     Review of Systems  Gastrointestinal:  Positive for abdominal pain. Negative for constipation, diarrhea, nausea and  vomiting.  Genitourinary:  Negative for dysuria, vaginal bleeding and vaginal discharge.  Musculoskeletal:  Negative for back pain.  Physical Exam   Blood pressure 123/80, pulse 95, temperature 98 F (36.7 C), temperature source Oral, resp. rate 17, height _0  (1.676 m), weight 89.8 kg, last menstrual period 05/13/2020, SpO2 99 %.  Physical Exam Vitals and nursing note reviewed. Exam conducted with a chaperone present.  Constitutional:      General: She is not in acute distress (appears comfortable).    Appearance: Normal appearance.  HENT:     Head: Normocephalic and atraumatic.  Cardiovascular:     Rate and Rhythm: Normal rate.  Pulmonary:     Effort: Pulmonary effort is normal. No respiratory distress.  Abdominal:     Palpations: Abdomen is soft.     Tenderness: There is no abdominal tenderness.  Genitourinary:    Comments: VE: closed/thick Musculoskeletal:        General: Normal range of motion.     Cervical back: Normal range of motion.  Skin:    General: Skin is warm and dry.  Neurological:     General: No focal deficit present.     Mental Status: She is oriented to person, place, and time and easily aroused.  Psychiatric:        Mood and Affect: Mood normal.        Behavior: Behavior normal.  EFM: 135 bpm, mod variability, + accels, no decels Toco: none  Results for orders placed or performed during the hospital encounter of 05/27/21 (from the past 24 hour(s))  CBG monitoring, ED     Status: Abnormal   Collection Time: 05/28/21 12:14 AM  Result Value Ref Range   Glucose-Capillary 133 (H) 70 - 99 mg/dL  Urinalysis, Routine w reflex microscopic Urine, Clean Catch     Status: Abnormal   Collection Time: 05/28/21  1:12 AM  Result Value Ref Range   Color, Urine YELLOW YELLOW   APPearance HAZY (A) CLEAR   Specific Gravity, Urine 1.017 1.005 - 1.030   pH 5.0 5.0 - 8.0   Glucose, UA NEGATIVE NEGATIVE mg/dL   Hgb urine dipstick NEGATIVE NEGATIVE   Bilirubin Urine  NEGATIVE NEGATIVE   Ketones, ur NEGATIVE NEGATIVE mg/dL   Protein, ur 100 (A) NEGATIVE mg/dL   Nitrite NEGATIVE NEGATIVE   Leukocytes,Ua  NEGATIVE NEGATIVE   RBC / HPF 0-5 0 - 5 RBC/hpf   WBC, UA 0-5 0 - 5 WBC/hpf   Bacteria, UA NONE SEEN NONE SEEN   Squamous Epithelial / LPF 0-5 0 - 5  Wet prep, genital     Status: Abnormal   Collection Time: 05/28/21  1:29 AM  Result Value Ref Range   Yeast Wet Prep HPF POC NONE SEEN NONE SEEN   Trich, Wet Prep NONE SEEN NONE SEEN   Clue Cells Wet Prep HPF POC NONE SEEN NONE SEEN   WBC, Wet Prep HPF POC >=10 (A) <10   Sperm NONE SEEN    MAU Course  Procedures  MDM Chart reviewed. Her pregnancy is complicated by B1DV, CHTN, DVT, and obesity. Of note she was admitted to [facility] on 05/27/21 in the afternoon for non-reassuring FHT (decel on NST). Her partner was noted to be threatening the staff and was escorted off property therefore the pt left AMA. Labs ordered and reviewed. No signs of PTL or UTI. Pain likely BH ctx or round ligament. Stable for discharge home.   Assessment and Plan   1. [redacted] weeks gestation of pregnancy   2. NST (non-stress test) reactive   3. Abdominal pain during pregnancy in third trimester    Discharge home Follow up at [facility] a scheduled PTL precautions  Allergies as of 05/28/2021       Reactions   Shellfish-derived Products Swelling   Sulfa Antibiotics Cough        Medication List     STOP taking these medications    amLODipine 5 MG tablet Commonly known as: NORVASC   atorvastatin 20 MG tablet Commonly known as: LIPITOR   colchicine 0.6 MG tablet   glipiZIDE 5 MG tablet Commonly known as: GLUCOTROL   losartan 25 MG tablet Commonly known as: COZAAR   promethazine-dextromethorphan 6.25-15 MG/5ML syrup Commonly known as: PROMETHAZINE-DM   Victoza 18 MG/3ML Sopn Generic drug: liraglutide       TAKE these medications    Accu-Chek Guide test strip Generic drug: glucose blood Use  as instructed. Inject into the skin three times daily E11.65   Accu-Chek Guide w/Device Kit 1 Device by Does not apply route 4 (four) times daily.   Accu-Chek Softclix Lancets lancets Use as instructed. Inject into the skin three times daily E11.65   albuterol 108 (90 Base) MCG/ACT inhaler Commonly known as: VENTOLIN HFA Inhale 1-2 puffs into the lungs every 6 (six) hours as needed for wheezing or shortness of breath.   B-D UF III MINI PEN NEEDLES 31G X 5 MM Misc Generic drug: Insulin Pen Needle Use as instructed. Inject into the skin once daily. e11.65   Blood Pressure Monitor Devi Please provide patient with insurance approved blood pressure monitor I10.0 She will pick up when ready. Please call.   metFORMIN 500 MG tablet Commonly known as: GLUCOPHAGE Take 1 tablet (500 mg total) by mouth 2 (two) times daily with a meal.   Misc. Devices Misc Please provide patient with insurance approval for CPAP along with the following settings: autopap 10-20. Medium size Resmed Full Face Mask AirFit F30 mask and heated humidification.   sertraline 50 MG tablet Commonly known as: ZOLOFT Take 1 tablet (50 mg total) by mouth at bedtime.       Julianne Handler, CNM 05/28/2021, 2:15 AM

## 2021-05-28 NOTE — MAU Note (Signed)
..  Kathryn Shelton is a 31 y.o. at Unknown here in MAU reporting: lower abdominal pain that began a couple days ago and it got worse yesterday, has not taken anything for the pain.  Denies vaginal bleeding or leaking of fluid. +FM   Pain score: 10/10 Vitals:   05/27/21 2350  BP: 126/90  Pulse: 100  Resp: 14  Temp: 98.8 F (37.1 C)  SpO2: 97%     FHT:150 Lab orders placed from triage: UA
